# Patient Record
Sex: Male | Born: 1955 | Race: White | Hispanic: No | Marital: Married | Smoking: Never smoker
Health system: Southern US, Community
[De-identification: ages and names within clinical notes are randomized; demographics above are authoritative.]

## PROBLEM LIST (undated history)

## (undated) DIAGNOSIS — K5790 Diverticulosis of intestine, part unspecified, without perforation or abscess without bleeding: Secondary | ICD-10-CM

## (undated) DIAGNOSIS — I5022 Chronic systolic (congestive) heart failure: Secondary | ICD-10-CM

## (undated) DIAGNOSIS — I10 Essential (primary) hypertension: Secondary | ICD-10-CM

## (undated) DIAGNOSIS — I2510 Atherosclerotic heart disease of native coronary artery without angina pectoris: Secondary | ICD-10-CM

## (undated) DIAGNOSIS — E876 Hypokalemia: Secondary | ICD-10-CM

## (undated) DIAGNOSIS — E785 Hyperlipidemia, unspecified: Secondary | ICD-10-CM

## (undated) DIAGNOSIS — Z9289 Personal history of other medical treatment: Secondary | ICD-10-CM

## (undated) DIAGNOSIS — K5792 Diverticulitis of intestine, part unspecified, without perforation or abscess without bleeding: Secondary | ICD-10-CM

## (undated) DIAGNOSIS — I4729 Other ventricular tachycardia: Secondary | ICD-10-CM

## (undated) DIAGNOSIS — I472 Ventricular tachycardia: Secondary | ICD-10-CM

## (undated) DIAGNOSIS — I493 Ventricular premature depolarization: Secondary | ICD-10-CM

## (undated) DIAGNOSIS — R001 Bradycardia, unspecified: Secondary | ICD-10-CM

## (undated) HISTORY — DX: Personal history of other medical treatment: Z92.89

## (undated) HISTORY — PX: TONSILLECTOMY: SHX5217

## (undated) HISTORY — PX: CHOLECYSTECTOMY: SHX55

## (undated) HISTORY — PX: VASECTOMY: SHX75

## (undated) HISTORY — DX: Diverticulitis of intestine, part unspecified, without perforation or abscess without bleeding: K57.92

## (undated) HISTORY — DX: Essential (primary) hypertension: I10

## (undated) HISTORY — DX: Hyperlipidemia, unspecified: E78.5

## (undated) HISTORY — PX: CORONARY ANGIOPLASTY WITH STENT PLACEMENT: SHX49

## (undated) HISTORY — DX: Diverticulosis of intestine, part unspecified, without perforation or abscess without bleeding: K57.90

## (undated) HISTORY — DX: Atherosclerotic heart disease of native coronary artery without angina pectoris: I25.10

---

## 2002-06-03 ENCOUNTER — Inpatient Hospital Stay (HOSPITAL_COMMUNITY): Admission: EM | Admit: 2002-06-03 | Discharge: 2002-06-07 | Payer: Self-pay | Admitting: Emergency Medicine

## 2002-06-03 ENCOUNTER — Encounter: Payer: Self-pay | Admitting: Emergency Medicine

## 2002-06-03 ENCOUNTER — Encounter: Payer: Self-pay | Admitting: Internal Medicine

## 2002-06-04 ENCOUNTER — Encounter: Payer: Self-pay | Admitting: Cardiology

## 2002-07-06 ENCOUNTER — Encounter (HOSPITAL_COMMUNITY): Admission: RE | Admit: 2002-07-06 | Discharge: 2002-10-04 | Payer: Self-pay | Admitting: Cardiology

## 2002-07-27 ENCOUNTER — Encounter: Payer: Self-pay | Admitting: Emergency Medicine

## 2002-07-27 ENCOUNTER — Emergency Department (HOSPITAL_COMMUNITY): Admission: EM | Admit: 2002-07-27 | Discharge: 2002-07-27 | Payer: Self-pay | Admitting: Emergency Medicine

## 2002-07-28 ENCOUNTER — Encounter: Payer: Self-pay | Admitting: Cardiology

## 2002-07-28 ENCOUNTER — Encounter: Payer: Self-pay | Admitting: Emergency Medicine

## 2002-07-28 ENCOUNTER — Observation Stay (HOSPITAL_COMMUNITY): Admission: EM | Admit: 2002-07-28 | Discharge: 2002-07-29 | Payer: Self-pay | Admitting: *Deleted

## 2002-10-05 ENCOUNTER — Encounter (HOSPITAL_COMMUNITY): Admission: RE | Admit: 2002-10-05 | Discharge: 2002-10-30 | Payer: Self-pay | Admitting: Cardiology

## 2003-05-02 ENCOUNTER — Encounter: Admission: RE | Admit: 2003-05-02 | Discharge: 2003-05-02 | Payer: Self-pay | Admitting: Internal Medicine

## 2003-05-02 ENCOUNTER — Encounter: Payer: Self-pay | Admitting: Internal Medicine

## 2004-03-16 ENCOUNTER — Encounter (INDEPENDENT_AMBULATORY_CARE_PROVIDER_SITE_OTHER): Payer: Self-pay | Admitting: *Deleted

## 2004-03-16 ENCOUNTER — Observation Stay (HOSPITAL_COMMUNITY): Admission: RE | Admit: 2004-03-16 | Discharge: 2004-03-16 | Payer: Self-pay | Admitting: General Surgery

## 2004-12-03 ENCOUNTER — Inpatient Hospital Stay (HOSPITAL_COMMUNITY): Admission: AD | Admit: 2004-12-03 | Discharge: 2004-12-05 | Payer: Self-pay | Admitting: Cardiology

## 2004-12-03 ENCOUNTER — Ambulatory Visit: Payer: Self-pay | Admitting: Internal Medicine

## 2004-12-10 ENCOUNTER — Ambulatory Visit: Payer: Self-pay | Admitting: Internal Medicine

## 2004-12-13 ENCOUNTER — Ambulatory Visit (HOSPITAL_COMMUNITY): Admission: RE | Admit: 2004-12-13 | Discharge: 2004-12-14 | Payer: Self-pay | Admitting: Cardiology

## 2004-12-13 ENCOUNTER — Ambulatory Visit: Payer: Self-pay | Admitting: Cardiology

## 2004-12-20 ENCOUNTER — Ambulatory Visit: Payer: Self-pay | Admitting: Cardiology

## 2005-01-18 ENCOUNTER — Ambulatory Visit: Payer: Self-pay | Admitting: Cardiology

## 2005-04-20 ENCOUNTER — Ambulatory Visit: Payer: Self-pay | Admitting: Internal Medicine

## 2005-04-21 ENCOUNTER — Inpatient Hospital Stay (HOSPITAL_COMMUNITY): Admission: EM | Admit: 2005-04-21 | Discharge: 2005-04-24 | Payer: Self-pay | Admitting: Emergency Medicine

## 2005-05-01 ENCOUNTER — Ambulatory Visit: Payer: Self-pay | Admitting: Internal Medicine

## 2005-06-03 ENCOUNTER — Ambulatory Visit: Payer: Self-pay | Admitting: Cardiology

## 2005-11-18 HISTORY — PX: COLONOSCOPY: SHX174

## 2005-11-21 ENCOUNTER — Ambulatory Visit: Payer: Self-pay | Admitting: Family Medicine

## 2005-12-17 ENCOUNTER — Ambulatory Visit: Payer: Self-pay | Admitting: Cardiology

## 2006-01-06 ENCOUNTER — Ambulatory Visit: Payer: Self-pay | Admitting: Cardiology

## 2006-01-13 ENCOUNTER — Ambulatory Visit: Payer: Self-pay | Admitting: Cardiology

## 2006-02-28 ENCOUNTER — Ambulatory Visit: Payer: Self-pay | Admitting: Internal Medicine

## 2006-03-14 ENCOUNTER — Ambulatory Visit: Payer: Self-pay | Admitting: Cardiology

## 2006-06-10 ENCOUNTER — Ambulatory Visit: Payer: Self-pay | Admitting: Internal Medicine

## 2006-07-24 ENCOUNTER — Ambulatory Visit: Payer: Self-pay | Admitting: Cardiology

## 2006-12-24 ENCOUNTER — Ambulatory Visit: Payer: Self-pay | Admitting: Internal Medicine

## 2006-12-24 LAB — CONVERTED CEMR LAB
Basophils Absolute: 0.1 K/uL
Basophils Relative: 1.6 % — ABNORMAL HIGH
Eosinophils Absolute: 0.3 K/uL
Eosinophils Relative: 4.7 %
HCT: 48.7 %
Hemoglobin: 16.9 g/dL
Lymphocytes Relative: 22.4 %
MCHC: 34.8 g/dL
MCV: 94.2 fL
Monocytes Absolute: 0.8 K/uL — ABNORMAL HIGH
Monocytes Relative: 11.5 % — ABNORMAL HIGH
Neutro Abs: 4.3 K/uL
Neutrophils Relative %: 59.8 %
PSA: 0.88 ng/mL
Platelets: 194 K/uL
RBC: 5.16 M/uL
RDW: 12.1 %
WBC: 7.1 10*3/microliter

## 2007-02-23 ENCOUNTER — Ambulatory Visit: Payer: Self-pay | Admitting: Gastroenterology

## 2007-03-17 ENCOUNTER — Ambulatory Visit: Payer: Self-pay | Admitting: Gastroenterology

## 2007-03-20 ENCOUNTER — Ambulatory Visit: Payer: Self-pay | Admitting: Cardiology

## 2007-03-30 DIAGNOSIS — I2510 Atherosclerotic heart disease of native coronary artery without angina pectoris: Secondary | ICD-10-CM | POA: Insufficient documentation

## 2007-03-30 DIAGNOSIS — R6889 Other general symptoms and signs: Secondary | ICD-10-CM | POA: Insufficient documentation

## 2007-03-30 DIAGNOSIS — I252 Old myocardial infarction: Secondary | ICD-10-CM | POA: Insufficient documentation

## 2007-03-31 ENCOUNTER — Encounter: Payer: Self-pay | Admitting: Internal Medicine

## 2007-03-31 ENCOUNTER — Ambulatory Visit: Payer: Self-pay | Admitting: Internal Medicine

## 2007-03-31 LAB — CONVERTED CEMR LAB
Nitrite: NEGATIVE
Specific Gravity, Urine: 1.015

## 2007-04-01 ENCOUNTER — Encounter: Payer: Self-pay | Admitting: Internal Medicine

## 2007-04-08 ENCOUNTER — Encounter: Payer: Self-pay | Admitting: Internal Medicine

## 2007-04-08 ENCOUNTER — Ambulatory Visit: Payer: Self-pay | Admitting: Internal Medicine

## 2007-04-09 ENCOUNTER — Encounter: Payer: Self-pay | Admitting: Internal Medicine

## 2007-12-22 ENCOUNTER — Ambulatory Visit: Payer: Self-pay | Admitting: Internal Medicine

## 2007-12-22 DIAGNOSIS — B372 Candidiasis of skin and nail: Secondary | ICD-10-CM | POA: Insufficient documentation

## 2008-02-17 ENCOUNTER — Ambulatory Visit: Payer: Self-pay | Admitting: Cardiology

## 2008-02-23 ENCOUNTER — Encounter: Payer: Self-pay | Admitting: Internal Medicine

## 2008-02-23 ENCOUNTER — Ambulatory Visit: Payer: Self-pay

## 2008-03-30 ENCOUNTER — Ambulatory Visit: Payer: Self-pay | Admitting: Cardiology

## 2008-04-06 ENCOUNTER — Telehealth (INDEPENDENT_AMBULATORY_CARE_PROVIDER_SITE_OTHER): Payer: Self-pay | Admitting: *Deleted

## 2008-07-27 ENCOUNTER — Telehealth (INDEPENDENT_AMBULATORY_CARE_PROVIDER_SITE_OTHER): Payer: Self-pay | Admitting: *Deleted

## 2008-08-26 ENCOUNTER — Telehealth (INDEPENDENT_AMBULATORY_CARE_PROVIDER_SITE_OTHER): Payer: Self-pay | Admitting: *Deleted

## 2008-08-29 ENCOUNTER — Ambulatory Visit: Payer: Self-pay | Admitting: Internal Medicine

## 2008-08-29 DIAGNOSIS — E785 Hyperlipidemia, unspecified: Secondary | ICD-10-CM | POA: Insufficient documentation

## 2008-08-29 DIAGNOSIS — R1011 Right upper quadrant pain: Secondary | ICD-10-CM | POA: Insufficient documentation

## 2008-08-29 DIAGNOSIS — I10 Essential (primary) hypertension: Secondary | ICD-10-CM | POA: Insufficient documentation

## 2008-08-29 LAB — CONVERTED CEMR LAB
Bilirubin Urine: NEGATIVE
Ketones, urine, test strip: NEGATIVE
Protein, U semiquant: NEGATIVE
Specific Gravity, Urine: 1.015
Urobilinogen, UA: 0.2
pH: 6.5

## 2008-08-30 ENCOUNTER — Encounter (INDEPENDENT_AMBULATORY_CARE_PROVIDER_SITE_OTHER): Payer: Self-pay | Admitting: *Deleted

## 2008-08-30 LAB — CONVERTED CEMR LAB
AST: 18 units/L (ref 0–37)
BUN: 12 mg/dL (ref 6–23)
Creatinine, Ser: 0.9 mg/dL (ref 0.4–1.5)
Eosinophils Absolute: 0.1 10*3/uL (ref 0.0–0.7)
HCT: 46 % (ref 39.0–52.0)
Hemoglobin: 16.5 g/dL (ref 13.0–17.0)
Lymphocytes Relative: 29.7 % (ref 12.0–46.0)
MCV: 92.7 fL (ref 78.0–100.0)
Monocytes Relative: 7.6 % (ref 3.0–12.0)
Neutrophils Relative %: 60.7 % (ref 43.0–77.0)
Platelets: 208 10*3/uL (ref 150–400)
RBC: 4.96 M/uL (ref 4.22–5.81)
RDW: 12 % (ref 11.5–14.6)

## 2008-09-02 ENCOUNTER — Ambulatory Visit: Payer: Self-pay | Admitting: Cardiology

## 2008-09-02 ENCOUNTER — Telehealth (INDEPENDENT_AMBULATORY_CARE_PROVIDER_SITE_OTHER): Payer: Self-pay | Admitting: *Deleted

## 2008-09-06 ENCOUNTER — Encounter (INDEPENDENT_AMBULATORY_CARE_PROVIDER_SITE_OTHER): Payer: Self-pay | Admitting: *Deleted

## 2008-09-06 ENCOUNTER — Ambulatory Visit: Payer: Self-pay | Admitting: Internal Medicine

## 2008-09-06 LAB — CONVERTED CEMR LAB
OCCULT 1: NEGATIVE
OCCULT 2: NEGATIVE
OCCULT 3: NEGATIVE

## 2008-09-12 ENCOUNTER — Ambulatory Visit: Payer: Self-pay | Admitting: Cardiology

## 2008-09-12 LAB — CONVERTED CEMR LAB
Cholesterol: 152 mg/dL (ref 0–200)
HDL: 38.4 mg/dL — ABNORMAL LOW (ref >39.0)
Triglycerides: 99 mg/dL (ref 0–149)
VLDL: 20 mg/dL (ref 0–40)

## 2008-12-14 LAB — HM COLONOSCOPY

## 2009-05-02 ENCOUNTER — Ambulatory Visit: Payer: Self-pay | Admitting: Internal Medicine

## 2009-07-06 ENCOUNTER — Telehealth (INDEPENDENT_AMBULATORY_CARE_PROVIDER_SITE_OTHER): Payer: Self-pay | Admitting: *Deleted

## 2009-07-06 ENCOUNTER — Ambulatory Visit: Payer: Self-pay | Admitting: Internal Medicine

## 2009-07-06 DIAGNOSIS — L821 Other seborrheic keratosis: Secondary | ICD-10-CM | POA: Insufficient documentation

## 2009-07-06 DIAGNOSIS — D239 Other benign neoplasm of skin, unspecified: Secondary | ICD-10-CM | POA: Insufficient documentation

## 2009-07-31 ENCOUNTER — Telehealth: Payer: Self-pay | Admitting: Cardiology

## 2009-08-01 ENCOUNTER — Ambulatory Visit: Payer: Self-pay | Admitting: Cardiology

## 2009-08-01 ENCOUNTER — Encounter: Payer: Self-pay | Admitting: Internal Medicine

## 2009-08-01 ENCOUNTER — Encounter (INDEPENDENT_AMBULATORY_CARE_PROVIDER_SITE_OTHER): Payer: Self-pay

## 2009-08-02 ENCOUNTER — Observation Stay (HOSPITAL_COMMUNITY): Admission: AD | Admit: 2009-08-02 | Discharge: 2009-08-03 | Payer: Self-pay | Admitting: Cardiology

## 2009-08-02 ENCOUNTER — Ambulatory Visit: Payer: Self-pay | Admitting: Cardiology

## 2009-08-05 ENCOUNTER — Emergency Department (HOSPITAL_COMMUNITY): Admission: EM | Admit: 2009-08-05 | Discharge: 2009-08-05 | Payer: Self-pay | Admitting: Emergency Medicine

## 2009-08-10 ENCOUNTER — Telehealth (INDEPENDENT_AMBULATORY_CARE_PROVIDER_SITE_OTHER): Payer: Self-pay | Admitting: *Deleted

## 2009-08-15 ENCOUNTER — Encounter: Payer: Self-pay | Admitting: Cardiology

## 2009-08-16 ENCOUNTER — Ambulatory Visit: Payer: Self-pay | Admitting: Cardiology

## 2009-08-21 ENCOUNTER — Ambulatory Visit: Payer: Self-pay | Admitting: Cardiology

## 2009-08-23 LAB — CONVERTED CEMR LAB
AST: 23 units/L (ref 0–37)
Albumin: 3.5 g/dL (ref 3.5–5.2)
Bilirubin, Direct: 0.2 mg/dL (ref 0.0–0.3)
Cholesterol: 94 mg/dL (ref 0–200)
LDL Cholesterol: 55 mg/dL (ref 0–99)
Total CHOL/HDL Ratio: 3
Total Protein: 6.6 g/dL (ref 6.0–8.3)

## 2009-09-29 ENCOUNTER — Encounter (INDEPENDENT_AMBULATORY_CARE_PROVIDER_SITE_OTHER): Payer: Self-pay | Admitting: *Deleted

## 2009-10-05 ENCOUNTER — Ambulatory Visit: Payer: Self-pay | Admitting: Cardiology

## 2009-10-23 ENCOUNTER — Telehealth (INDEPENDENT_AMBULATORY_CARE_PROVIDER_SITE_OTHER): Payer: Self-pay | Admitting: *Deleted

## 2010-01-11 ENCOUNTER — Ambulatory Visit: Payer: Self-pay | Admitting: Cardiology

## 2010-01-31 ENCOUNTER — Ambulatory Visit: Payer: Self-pay | Admitting: Internal Medicine

## 2010-01-31 DIAGNOSIS — M79609 Pain in unspecified limb: Secondary | ICD-10-CM | POA: Insufficient documentation

## 2010-02-19 ENCOUNTER — Telehealth (INDEPENDENT_AMBULATORY_CARE_PROVIDER_SITE_OTHER): Payer: Self-pay | Admitting: *Deleted

## 2010-04-13 ENCOUNTER — Telehealth: Payer: Self-pay | Admitting: Cardiology

## 2010-08-14 ENCOUNTER — Telehealth (INDEPENDENT_AMBULATORY_CARE_PROVIDER_SITE_OTHER): Payer: Self-pay | Admitting: *Deleted

## 2010-08-14 ENCOUNTER — Telehealth: Payer: Self-pay | Admitting: Cardiology

## 2010-09-28 ENCOUNTER — Ambulatory Visit: Payer: Self-pay | Admitting: Internal Medicine

## 2010-09-28 ENCOUNTER — Telehealth: Payer: Self-pay | Admitting: Internal Medicine

## 2010-09-28 DIAGNOSIS — N400 Enlarged prostate without lower urinary tract symptoms: Secondary | ICD-10-CM | POA: Insufficient documentation

## 2010-09-28 DIAGNOSIS — G250 Essential tremor: Secondary | ICD-10-CM | POA: Insufficient documentation

## 2010-10-01 ENCOUNTER — Ambulatory Visit: Payer: Self-pay | Admitting: Internal Medicine

## 2010-10-15 LAB — CONVERTED CEMR LAB
ALT: 31 units/L (ref 0–53)
AST: 24 units/L (ref 0–37)
Albumin: 4 g/dL (ref 3.5–5.2)
Basophils Relative: 0.5 % (ref 0.0–3.0)
CO2: 28 meq/L (ref 19–32)
Chloride: 105 meq/L (ref 96–112)
Cholesterol: 141 mg/dL (ref 0–200)
Creatinine, Ser: 1 mg/dL (ref 0.4–1.5)
Eosinophils Absolute: 0.2 10*3/uL (ref 0.0–0.7)
Eosinophils Relative: 2.3 % (ref 0.0–5.0)
Glucose, Bld: 96 mg/dL (ref 70–99)
HDL: 43.7 mg/dL (ref >39.00)
Lymphocytes Relative: 34.4 % (ref 12.0–46.0)
Lymphs Abs: 2.6 10*3/uL (ref 0.7–4.0)
MCHC: 35.4 g/dL (ref 30.0–36.0)
Monocytes Absolute: 0.6 10*3/uL (ref 0.1–1.0)
Neutro Abs: 4.1 10*3/uL (ref 1.4–7.7)
Platelets: 209 10*3/uL (ref 150.0–400.0)
Potassium: 4.7 meq/L (ref 3.5–5.1)
Sodium: 138 meq/L (ref 135–145)
Total Bilirubin: 1.1 mg/dL (ref 0.3–1.2)
Total CHOL/HDL Ratio: 3
VLDL: 11.6 mg/dL (ref 0.0–40.0)

## 2010-10-23 ENCOUNTER — Ambulatory Visit: Payer: Self-pay | Admitting: Sports Medicine

## 2010-10-23 DIAGNOSIS — M722 Plantar fascial fibromatosis: Secondary | ICD-10-CM | POA: Insufficient documentation

## 2010-11-20 ENCOUNTER — Encounter (INDEPENDENT_AMBULATORY_CARE_PROVIDER_SITE_OTHER): Payer: Self-pay | Admitting: *Deleted

## 2010-11-20 ENCOUNTER — Other Ambulatory Visit: Payer: Self-pay | Admitting: Physician Assistant

## 2010-11-20 ENCOUNTER — Ambulatory Visit
Admission: RE | Admit: 2010-11-20 | Discharge: 2010-11-20 | Payer: Self-pay | Source: Home / Self Care | Attending: Cardiology | Admitting: Cardiology

## 2010-11-20 ENCOUNTER — Ambulatory Visit: Admit: 2010-11-20 | Payer: Self-pay | Admitting: Physician Assistant

## 2010-11-20 ENCOUNTER — Encounter: Payer: Self-pay | Admitting: Physician Assistant

## 2010-11-20 ENCOUNTER — Telehealth: Payer: Self-pay | Admitting: Cardiology

## 2010-11-20 DIAGNOSIS — R079 Chest pain, unspecified: Secondary | ICD-10-CM | POA: Insufficient documentation

## 2010-11-20 LAB — BASIC METABOLIC PANEL
BUN: 12 mg/dL (ref 6–23)
CO2: 26 mEq/L (ref 19–32)
Calcium: 9.2 mg/dL (ref 8.4–10.5)
Chloride: 105 mEq/L (ref 96–112)
Creatinine, Ser: 0.8 mg/dL (ref 0.4–1.5)
GFR: 104.47 mL/min (ref >60.00)
Glucose, Bld: 84 mg/dL (ref 70–99)
Potassium: 4.2 mEq/L (ref 3.5–5.1)
Sodium: 139 mEq/L (ref 135–145)

## 2010-11-20 LAB — PROTIME-INR
INR: 1 ratio (ref 0.8–1.0)
Prothrombin Time: 11.2 s (ref 9.7–11.8)

## 2010-11-20 LAB — APTT: aPTT: 28.6 s (ref 21.7–28.8)

## 2010-11-20 LAB — CBC WITH DIFFERENTIAL/PLATELET
Basophils Absolute: 0 10*3/uL (ref 0.0–0.1)
Basophils Relative: 0.4 % (ref 0.0–3.0)
Eosinophils Absolute: 0.2 10*3/uL (ref 0.0–0.7)
Eosinophils Relative: 2.4 % (ref 0.0–5.0)
HCT: 47.6 % (ref 39.0–52.0)
Hemoglobin: 16.6 g/dL (ref 13.0–17.0)
Lymphocytes Relative: 36 % (ref 12.0–46.0)
Lymphs Abs: 2.7 10*3/uL (ref 0.7–4.0)
MCHC: 34.8 g/dL (ref 30.0–36.0)
MCV: 95.1 fl (ref 78.0–100.0)
Monocytes Absolute: 0.8 10*3/uL (ref 0.1–1.0)
Monocytes Relative: 11.3 % (ref 3.0–12.0)
Neutro Abs: 3.7 10*3/uL (ref 1.4–7.7)
Neutrophils Relative %: 49.9 % (ref 43.0–77.0)
Platelets: 198 10*3/uL (ref 150.0–400.0)
RBC: 5.01 Mil/uL (ref 4.22–5.81)
RDW: 12.9 % (ref 11.5–14.6)
WBC: 7.4 10*3/uL (ref 4.5–10.5)

## 2010-11-21 ENCOUNTER — Ambulatory Visit
Admission: RE | Admit: 2010-11-21 | Payer: Self-pay | Source: Home / Self Care | Attending: Cardiovascular Disease | Admitting: Cardiovascular Disease

## 2010-11-26 ENCOUNTER — Telehealth (INDEPENDENT_AMBULATORY_CARE_PROVIDER_SITE_OTHER): Payer: Self-pay | Admitting: Radiology

## 2010-11-27 ENCOUNTER — Encounter (HOSPITAL_COMMUNITY)
Admission: RE | Admit: 2010-11-27 | Discharge: 2010-12-18 | Payer: Self-pay | Source: Home / Self Care | Attending: Cardiovascular Disease | Admitting: Cardiovascular Disease

## 2010-11-27 ENCOUNTER — Ambulatory Visit: Admission: RE | Admit: 2010-11-27 | Discharge: 2010-11-27 | Payer: Self-pay | Source: Home / Self Care

## 2010-11-27 ENCOUNTER — Encounter: Payer: Self-pay | Admitting: Cardiovascular Disease

## 2010-12-07 ENCOUNTER — Ambulatory Visit
Admission: RE | Admit: 2010-12-07 | Discharge: 2010-12-07 | Payer: Self-pay | Source: Home / Self Care | Attending: Cardiology | Admitting: Cardiology

## 2010-12-07 ENCOUNTER — Encounter: Payer: Self-pay | Admitting: Cardiology

## 2010-12-13 ENCOUNTER — Encounter: Payer: Self-pay | Admitting: Family Medicine

## 2010-12-13 ENCOUNTER — Ambulatory Visit
Admission: RE | Admit: 2010-12-13 | Discharge: 2010-12-13 | Payer: Self-pay | Source: Home / Self Care | Attending: Family Medicine | Admitting: Family Medicine

## 2010-12-13 DIAGNOSIS — J029 Acute pharyngitis, unspecified: Secondary | ICD-10-CM | POA: Insufficient documentation

## 2010-12-18 NOTE — Progress Notes (Signed)
Summary: CLONAZEPAM REFILL--HAS CPX APPT  Phone Note Refill Request Message from:  Patient on August 14, 2010 10:28 AM  Refills Requested: Medication #1:  KLONOPIN 0.5 MG  TABS 1 at bedtime as needed [facility]    NEEDS 90 DAY SUPPLY    PT PHONE NUMBER IS [phone]  Initial call taken by: Jerolyn Shin,  August 14, 2010 10:30 AM  Follow-up for Phone Call        Patient needs to schedule a yearly appointment seen for an acute on 01/31/10  Please find out if patient would like for me to fax rx, mail or pick-up Follow-up by: Shonna Chock CMA,  August 14, 2010 1:34 PM  Additional Follow-up for Phone Call Additional follow up Details #1::        PATIENT HAS CPX APPT FOR THUR 10/13 AT 10:45---PLEASE FAX PRESCRIPTION REFILL TO [facility] Additional Follow-up by: Jerolyn Shin,  August 15, 2010 12:30 PM    Additional Follow-up for Phone Call Additional follow up Details #2::    Faxed to [facility] Follow-up by: Shonna Chock CMA,  August 15, 2010 2:55 PM  Prescriptions: KLONOPIN 0.5 MG  TABS (CLONAZEPAM) 1 at bedtime as needed  #90 x 0   Entered by:   Shonna Chock CMA   Authorized by:   Marga Melnick MD   Signed by:   Shonna Chock CMA on 08/14/2010   Method used:   Print then Give to Patient   RxID:   [id number]

## 2010-12-18 NOTE — Progress Notes (Signed)
Summary: sports med doc  Phone Note Call from Patient Call back at [facility] Phone [phone]   Summary of Call: Pt would like to know is he going to be set up to see a Sports Med doctor? He thinks he remembers discussing this during his OV. Initial call taken by: Army Fossa CMA,  September 28, 2010 4:40 PM  Follow-up for Phone Call        PATIENT REFERRED TO DR. BERT FIELDS, FAXED & FLAGGED THRU EMR.  DR. FIELDS OFFICE CONTACTS THE PATIENT. Magdalen Spatz [facility]  September 28, 2010 4:59 PM

## 2010-12-18 NOTE — Assessment & Plan Note (Signed)
Summary: CPX AND LABS///SP[facility]   Vital Signs:  Patient profile:   55 year old male Height:      64.25 inches Weight:      200.2 pounds BMI:     34.22 Temp:     98.4 degrees F oral Pulse rate:   80 / minute Resp:     14 per minute BP sitting:   114 / 78  (left arm) Cuff size:   large  Vitals Entered By: Shonna Chock CMA (September 28, 2010 4:00 PM) CC: CPX, patient states EKG updated at Cardiologist   CC:  CPX and patient states EKG updated at Cardiologist.  History of Present Illness:         Zachary Stone is here for a physical; he continues to have Plantar Fasciitis symptoms .THere has been partialresponse to arch supports. Hyperlipidemia Follow-Up        The patient reports fatigue, but denies muscle aches, GI upset, abdominal pain, flushing, itching, constipation, and diarrhea.  Other symptoms include intermittent chest pain/pressure; this has been discussed with Dr Riley Kill.  The patient denies the following symptoms: exercise intolerance, dypsnea, palpitations, syncope, and pedal edema.  Compliance with medications (by patient report) has been near 100%.  Dietary compliance has been fair- good.  The patient reports exercising occasionally, 2X/week.  Adjunctive measures currently  used by the patient include ASA.   Hypertension Follow-Up       The patient denies lightheadedness, urinary frequency, and headaches.  Compliance with medications (by patient report) has been near 100%.  Adjunctive measures currently used by the patient  does NOT include salt restriction. BP not monitored.   Current Medications (verified): 1)  Diovan 80 Mg  Tabs (Valsartan) .Marland Kitchen.. 1 By Mouth Qd 2)  Plavix 75 Mg  Tabs (Clopidogrel Bisulfate) .Marland Kitchen.. 1 By Mouth Qd 3)  Vytorin 10-20 Mg  Tabs (Ezetimibe-Simvastatin) .... Take One Tablet  By Mouth At Bedtime 4)  Aspirin 81 Mg Tbec (Aspirin) .... Take One Tablet By Mouth Daily 5)  Klonopin 0.5 Mg  Tabs (Clonazepam) .Marland Kitchen.. 1 At Bedtime As Needed 6)  Multivitamins   Tabs  (Multiple Vitamin) .... Take 1 Tablet By Mouth Once A Day  Allergies: 1)  ! Morphine 2)  ! Iodine  Past History:  Past Medical History: Coronary artery disease--staged PCI in 2006 with DES to RCA and LAD. Myocardial infarction,PMH  of, 2003--non DES stent Hyperlipidemia Hypertension  Past Surgical History: Cholecystectomy PTCA/stent X4, Dr Riley Kill Tonsillectomy Colonoscopy 2007 negative ; redo 2017 Vasectomy, S/P reversal  Family History: Family History High cholesterol M uncle d ? skin cancer  Father: CABG, triple vessel Mother: COAD Siblings: neg  Social History: Occupation: Medical sales representative Married Never Smoked Alcohol use-yes: socially  Review of Systems  The patient denies anorexia, fever, vision loss, decreased hearing, hoarseness, prolonged cough, hemoptysis, melena, hematochezia, severe indigestion/heartburn, hematuria, suspicious skin lesions, depression, unusual weight change, abnormal bleeding, enlarged lymph nodes, and angioedema.         Weight up 10#  Physical Exam  General:  well-nourished; alert,appropriate and cooperative throughout examination Head:  Normocephalic and atraumatic without obvious abnormalities.  Pattern  alopecia; goatee/moustache  Eyes:  No corneal or conjunctival inflammation noted. Perrla. Funduscopic exam benign, without hemorrhages, exudates or papilledema.  Ears:  External ear exam shows no significant lesions or deformities.  Otoscopic examination reveals clear canals, tympanic membranes are intact bilaterally without bulging, retraction, inflammation or discharge. Hearing is grossly normal bilaterally. Nose:  External nasal examination shows no  deformity or inflammation. Nasal mucosa are pink and moist without lesions or exudates. Mouth:  Oral mucosa and oropharynx without lesions or exudates.  Teeth in good repair. Neck:  No deformities, masses, or tenderness noted. Lungs:  Normal respiratory effort, chest expands  symmetrically. Lungs are clear to auscultation, no crackles or wheezes. Heart:  Normal rate and regular rhythm. S1 and S2 normal without gallop, murmur, click, rub . S4 Abdomen:  Bowel sounds positive,abdomen soft and non-tender without masses, organomegaly or hernias noted. Rectal:  No external abnormalities noted. Normal sphincter tone. No rectal masses or tenderness. Genitalia:  Testes bilaterally descended without nodularity, tenderness or masses. No scrotal masses or lesions. No penis lesions or urethral discharge. Prostate:  no nodules, no asymmetry, no induration, and 1+ enlarged.   Msk:  No deformity or scoliosis noted of thoracic or lumbar spine.   Pulses:  R and L carotid,radial,dorsalis pedis and posterior tibial pulses are full and equal bilaterally Extremities:  No clubbing, cyanosis, edema, or deformity noted with normal full range of motion of all joints.   Neurologic:  alert & oriented X3 and DTRs symmetrical and normal.  Fine tremor Skin:  Intact without suspicious lesions or rashes Cervical Nodes:  No lymphadenopathy noted Axillary Nodes:  No palpable lymphadenopathy Psych:  memory intact for recent and remote, normally interactive, and good eye contact.     Impression & Recommendations:  Problem # 1:  ROUTINE GENERAL MEDICAL EXAM@HEALTH  CARE FACL (ICD-V70.0)  Problem # 2:  HYPERTENSION (ICD-401.9) controlled His updated medication list for this problem includes:    Diovan 80 Mg Tabs (Valsartan) .Marland Kitchen... 1 by mouth qd    Propranolol Hcl 10 Mg Tabs (Propranolol hcl) .Marland Kitchen... 1 every hrs as needed  Problem # 3:  HYPERLIPIDEMIA (ICD-272.4)  His updated medication list for this problem includes:    Vytorin 10-20 Mg Tabs (Ezetimibe-simvastatin) .Marland Kitchen... 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 1  by mouth qd 2)  Plavix 75 Mg Tabs (Clopidogrel bisulfate) .Marland Kitchen.. 1 by mouth qd 3)  Vytorin 10-20 Mg Tabs (Ezetimibe-simvastatin) .... Take one tablet  by mouth at bedtime 4)  Aspirin 81 Mg Tbec (Aspirin) .... Take one tablet by mouth daily 5)  Klonopin 0.5 Mg Tabs (Clonazepam) .Marland Kitchen.. 1 at bedtime as needed 6)  Multivitamins Tabs (Multiple vitamin) .... Take 1 tablet by mouth once a day 7)  Propranolol Hcl 10 Mg Tabs (Propranolol hcl) .Marland Kitchen.. 1 every hrs as needed  Patient Instructions: 1)  Consider Saw Palmetto Extract for prostate symptoms.Please schedule fasting labs: 2)  BMP ; 3)  Hepatic Panel; 4)  Lipid Panel ; 5)  TSH ; 6)  CBC w/ Diff ; 7)  PSA . Prescriptions: PROPRANOLOL HCL 10 MG TABS (PROPRANOLOL HCL) 1 every hrs as needed  #30 x 5   Entered and Authorized by:   Marga Melnick MD   Signed by:   Marga Melnick MD on 09/28/2010   Method used:   Print then Give to Patient   RxID:   [id number]    Orders Added: 1)  Est. Patient 40-64 years [56213]

## 2010-12-18 NOTE — Progress Notes (Signed)
Summary: NEEDS CLONAZEPAM 90 REFILL FAXED TO [facility]  Phone Note Call from Patient Call back at Home Phone [phone]   Caller: Patient Summary of Call: NEEDS 90 DAY REFILL FOR GENERIC CLONAZPAM---HAD SEEN DR HOPPER FOR ACUTE VISITS, BUT I CANNOT FIND LISITING FOR CPX IN EMR  PT NEEDS REFILLED CALLED OR FAXED INTO [facility] SINCE HE ONLY HAS 7 PILLS LEFT  ADVISED PATIENT THAT HE MAY HAVE TO SCHEDULE CPX SO THAT REFILL CAN BE CALLED IN--ADVISED THAT WE WILL CALL HIM TO TELL HIM THAT PRESCRIPTION WAS CALLED IN  AND THAT WE WILL ALSO TELL HIM IF HE HAS TO SCHEDULE A CPX Initial call taken by: Jerolyn Shin,  February 19, 2010 9:19 AM  Follow-up for Phone Call        Dr.Hopper this patient has a cardiologist and was seen in the hospital with in the past year. ? If he needs a CPX with you to get a Mail Order (90 day supply of controlled med) Follow-up by: Shonna Chock,  February 19, 2010 10:34 AM  Additional Follow-up for Phone Call Additional follow up Details #1::        Per Dr.Hopper patient should have a CPX a year from the date of his last Hospital visit.  I called and informed patient, last hospital visit 07/2009. Yearly/CPX would be due 07/2010 of this year.  Additional Follow-up by: Shonna Chock,  February 21, 2010 8:32 AM    Prescriptions: KLONOPIN 0.5 MG  TABS (CLONAZEPAM) 1 at bedtime as needed  #90 x 0   Entered and Authorized by:   Marga Melnick MD   Signed by:   Marga Melnick MD on 02/19/2010   Method used:   Printed then faxed to ...       [facility]* (mail-order)             ,          Ph: [phone]       Fax: [phone]   RxID:   [id number]

## 2010-12-18 NOTE — Letter (Signed)
Summary: *Consult Note  [facility]  [address]   [place], [place] [address]   Phone: [phone]  Fax: [phone]    Re:    Zachary Stone DOB:    August 11, 1956 Marga Melnick, MD [facility] [place], [place]  10/23/10   Dear Alfonse Flavors:    Thank you for requesting that we see the above patient for consultation.  A copy of the detailed office note will be sent under separate cover, for your review.  Evaluation today is consistent with:  1)  PLANTAR FASCIITIS, LEFT (ICD-728.71)   Our recommendation is for: a program of exercises, stretches and sports insole support.  If he does not improve in 8 weeks I would opt for custom orthotics.   New Orders include:  1)  Consultation Level II [99242] 2)  Sports Insoles [L3510] 3)  [place] LIMITED [31517]    After today's visit, the patients current medications include: 1)  DIOVAN 80 MG  TABS (VALSARTAN) 1 by mouth qd 2)  PLAVIX 75 MG  TABS (CLOPIDOGREL BISULFATE) 1 by mouth qd 3)  VYTORIN 10-20 MG  TABS (EZETIMIBE-SIMVASTATIN) take one tablet  by mouth at bedtime 4)  ASPIRIN 81 MG TBEC (ASPIRIN) Take one tablet by mouth daily 5)  KLONOPIN 0.5 MG  TABS (CLONAZEPAM) 1 at bedtime as needed 6)  MULTIVITAMINS   TABS (MULTIPLE VITAMIN) Take 1 tablet by mouth once a day 7)  PROPRANOLOL HCL 10 MG TABS (PROPRANOLOL HCL) 1 every hrs as needed   Thank you for this consultation.  If you have any further questions regarding the care of this patient, please do not hesitate to contact me @ [phone].  Thank you for this opportunity to look after your patient.  Sincerely,  Vincent Gros MD

## 2010-12-18 NOTE — Assessment & Plan Note (Signed)
Summary: f61m   Visit Type:  3 months follow up  CC:  Chest pains.  History of Present Illness: Doing quite well.  No major symptoms.  Elbow (L) and R ankle bother him, but he plays mandolin.  Symptoms with statins reveiwed.  Denies chest pain.  Current Medications (verified): 1)  Diovan 80 Mg  Tabs (Valsartan) .Marland Kitchen.. 1 By Mouth Qd 2)  Plavix 75 Mg  Tabs (Clopidogrel Bisulfate) .Marland Kitchen.. 1 By Mouth Qd 3)  Vytorin 10-20 Mg  Tabs (Ezetimibe-Simvastatin) .Marland Kitchen.. 1 By Mouth Qd 4)  Aspirin 81 Mg Tbec (Aspirin) .... Take One Tablet By Mouth Daily 5)  Klonopin 0.5 Mg  Tabs (Clonazepam) .Marland Kitchen.. 1 At Bedtime As Needed 6)  Multivitamins   Tabs (Multiple Vitamin) .... Take 1 Tablet By Mouth Once A Day  Allergies: 1)  ! Morphine 2)  ! Iodine  Vital Signs:  Patient profile:   55 year old male Height:      64 inches Weight:      189 pounds BMI:     32.56 Pulse rate:   54 / minute Pulse rhythm:   irregular Resp:     18 per minute BP sitting:   122 / 76  (left arm) Cuff size:   large  Vitals Entered By: Vikki Ports (January 11, 2010 9:30 AM)  Physical Exam  General:  Well developed, well nourished, in no acute distress. Head:  normocephalic and atraumatic Lungs:  Clear bilaterally to auscultation and percussion. Heart:  PMI non displaced.  Normal S1 and S2.  No gallop. Extremities:  No clubbing or cyanosis. Neurologic:  Alert and oriented x 3.   EKG  Procedure date:  01/11/2010  Findings:      NSR.  Anterior MI, old.  Inferior MI, old.  No new changes.  Impression & Recommendations:  Problem # 1:  CORONARY ARTERY DISEASE (ICD-414.00) stable.  Remains on DAPT. His updated medication list for this problem includes:    Plavix 75 Mg Tabs (Clopidogrel bisulfate) .Marland Kitchen... 1 by mouth qd    Aspirin 81 Mg Tbec (Aspirin) .Marland Kitchen... Take one tablet by mouth daily  Problem # 2:  HYPERTENSION (ICD-401.9) Controlled. His updated medication list for this problem includes:    Diovan 80 Mg Tabs  (Valsartan) .Marland Kitchen... 1 by mouth qd    Aspirin 81 Mg Tbec (Aspirin) .Marland Kitchen... Take one tablet by mouth daily  Orders: EKG w/ Interpretation (93000)  Problem # 3:  HYPERLIPIDEMIA (ICD-272.4) reviewed in detail.  Not likely symptoms from statins.  continue medical therepy.   His updated medication list for this problem includes:    Vytorin 10-20 Mg Tabs (Ezetimibe-simvastatin) .Marland Kitchen... 1 by mouth qd  Patient Instructions: 1)  Your physician recommends that you continue on your current medications as directed. Please refer to the Current Medication list given to you today. 2)  Your physician wants you to follow-up in:  6 MONTHS. You will receive a reminder letter in the mail two months in advance. If you don't receive a letter, please call our office to schedule the follow-up appointment.

## 2010-12-18 NOTE — Assessment & Plan Note (Signed)
Summary: sore heel/kdc   Vital Signs:  Patient profile:   55 year old male Weight:      190.8 pounds Temp:     98.4 degrees F oral Pulse rate:   60 / minute Resp:     16 per minute BP sitting:   130 / 78  (left arm) Cuff size:   large  Vitals Entered By: Shonna Chock (January 31, 2010 2:35 PM) CC: Left Heel pain x 3 weeks Comments REVIEWED MED LIST, PATIENT AGREED DOSE AND INSTRUCTION CORRECT    CC:  Left Heel pain x 3 weeks.  History of Present Illness: 3-4 weeks of L heel pain since walking program started; treadmill 40 min / day 3-4X/week. No other injury . 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 Rx: "hoping"  Allergies: 1)  ! Morphine 2)  ! Iodine  Physical Exam  Pulses:  R and L dorsalis pedis and posterior tibial pulses are full and equal bilaterally Extremities:  Full ROM L ankle. Tender @ L heel medially. Clinically no plantar fasciitis. Low arches Skin:  Intact without suspicious lesions or rashes   Impression & Recommendations:  Problem # 1:  HEEL PAIN, LEFT (ICD-729.5) plantar fasciitis variant  Complete Medication List: 1)  Diovan 80 Mg Tabs (Valsartan) .Marland Kitchen.. 1 by mouth qd 2)  Plavix 75 Mg Tabs (Clopidogrel bisulfate) .Marland Kitchen.. 1 by mouth qd 3)  Vytorin 10-20 Mg Tabs (Ezetimibe-simvastatin) .Marland Kitchen.. 1 by mouth qd 4)  Aspirin 81 Mg Tbec (Aspirin) .... Take one tablet by mouth daily 5)  Klonopin 0.5 Mg Tabs (Clonazepam) .Marland Kitchen.. 1 at bedtime as needed 6)  Multivitamins Tabs (Multiple vitamin) .... Take 1 tablet by mouth once a day 7)  Celebrex 200 Mg Caps (Celecoxib) .Marland Kitchen.. 1 two times a day as needed  Patient Instructions: 1)  Epsom Salts soaks @ night . Arch supports when ambulating . Glucosamine sulfate 1500 mg once daily until pain free for 2 weeks.Stationery bike in place of treadmill until well Prescriptions: CELEBREX 200 MG CAPS (CELECOXIB) 1 two times a day as needed  #12 x 0   Entered and  Authorized by:   Marga Melnick MD   Signed by:   Marga Melnick MD on 01/31/2010   Method used:   Samples Given   RxID:   [id number]

## 2010-12-18 NOTE — Progress Notes (Signed)
Summary: NEEDS RX REFILL   Phone Note Call from Patient Call back at Home Phone [phone]   Caller: PT Reason for Call: Refill Medication Summary of Call: PT NEEDS VYTORIN CALLED INTO [facility]. PT [facility] CARE NOW PLAN [phone] GROUP # [id number] MEMBER ID [id number] Initial call taken by: Faythe Ghee,  August 14, 2010 9:41 AM  Follow-up for Phone Call        Left message for pt to call back. Julieta Gutting, RN, BSN  August 14, 2010 10:05 AM  I spoke with the pt and he has already made [facility] aware of his new insurance. I will send Rx into pharmacy.  Follow-up by: Julieta Gutting, RN, BSN,  August 14, 2010 10:20 AM    New/Updated Medications: VYTORIN 10-20 MG  TABS (EZETIMIBE-SIMVASTATIN) take one tablet  by mouth at bedtime Prescriptions: VYTORIN 10-20 MG  TABS (EZETIMIBE-SIMVASTATIN) take one tablet  by mouth at bedtime  #90 x 3   Entered by:   Julieta Gutting, RN, BSN   Authorized by:   Ronaldo Miyamoto, MD, [facility]   Signed by:   Julieta Gutting, RN, BSN on 08/14/2010   Method used:   Electronically to        [facility] (retail)             ,          Ph: [phone]       Fax: [phone]   RxID:   [id number]

## 2010-12-18 NOTE — Progress Notes (Signed)
Summary: REFILL  Phone Note Refill Request Message from:  Patient on Apr 13, 2010 9:55 AM  Refills Requested: Medication #1:  DIOVAN 80 MG  TABS 1 by mouth qd SEND TO [facility]  Initial call taken by: Judie Grieve,  Apr 13, 2010 9:56 AM  Follow-up for Phone Call        Rx faxed to pharmacy Follow-up by: Vikki Ports,  Apr 13, 2010 11:55 AM    Prescriptions: DIOVAN 80 MG  TABS (VALSARTAN) 1 by mouth qd  #90 x 3   Entered by:   Vikki Ports   Authorized by:   Ronaldo Miyamoto, MD, [facility]   Signed by:   Vikki Ports on 04/13/2010   Method used:   Faxed to ...       [facility]* (mail-order)             ,          Ph: [phone]       Fax: [phone]   RxID:   [id number]

## 2010-12-18 NOTE — Assessment & Plan Note (Signed)
Summary: NP,PLANTAR FASCITIIS,MC   Vital Signs:  Patient profile:   55 year old male BP sitting:   125 / 83  Vitals Entered By: Lillia Pauls CMA (October 23, 2010 9:28 AM)  History of Present Illness: pt here today, pt of dr hopper, with plantar fasciitis since march. pt attribute this to having started walking on the treadmill earlier in march. pt hasnt noticed any swelling associated with this. pt has noticed that biking has eased the pain along with some temp hard 3/4 insoles he has purchased OTC. on exam, pt does have some pes cavus.  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 Morphine 2)  ! Iodine  Physical Exam  General:  Well-developed,well-nourished,in no acute distress; alert,appropriate and cooperative throughout examination Msk:  Rt leg 1 cm longer than lt  mild TTP along left heel good great toe motion foot has slt cavus shape but now shoes pronation at midfoot w some loss of long arch walking gait shows he turns left foot ourward as well as pronating Additional Exam:  MSK [place] PF on LT is 0.76 cms no tears noted but thickening and scarring at insertion into heel this is compared to AT on left and to PF on RT thsee are norm w thickness of about 0.47   Impression & Recommendations:  Problem # 1:  HEEL PAIN, LEFT (ICD-729.5)  This is chronic now since last Feb  no resolution w what he has tried  given stretches and exercise for PF heel lift for leg length diff arch strap  Orders: Sports Insoles (L3510) [place] LIMITED (54098)  Problem # 2:  PLANTAR FASCIITIS, LEFT (ICD-728.71)  Sports insoles added to shoes needs to wear these as much as possible  OK to bike restart some gentle walking on TM and steadily increase as he does his exercises  I  would like to reck in 8 wks  Orders: Sports Insoles (L3510) [place] LIMITED (11914)  Complete Medication List: 1)  Diovan 80 Mg Tabs (Valsartan) .Marland Kitchen.. 1 by mouth qd 2)  Plavix 75 Mg Tabs (Clopidogrel bisulfate) .Marland Kitchen.. 1 by mouth qd 3)  Vytorin 10-20 Mg Tabs (Ezetimibe-simvastatin) .... Take one tablet  by mouth at bedtime 4)  Aspirin 81 Mg Tbec (Aspirin) .... Take one tablet by mouth daily 5)  Klonopin 0.5 Mg Tabs (Clonazepam) .Marland Kitchen.. 1 at bedtime as needed 6)  Multivitamins Tabs (Multiple vitamin) .... Take 1 tablet by mouth once a day 7)  Propranolol Hcl 10 Mg Tabs (Propranolol hcl) .Marland Kitchen.. 1 every hrs as needed   Orders Added: 1)  Consultation Level II [99242] 2)  Sports Insoles [L3510] 3)  [place] LIMITED [78295]

## 2010-12-20 NOTE — Letter (Signed)
Summary: Cardiac Catheterization Instructions- [facility]  [facility], Main Office  [address]   [place], [place] [address]   Phone: [phone]  Fax: [phone]     11/20/2010 MRN: [id number]  [facility] [place] [address] [place], [place]  [address]  Dear Mr. WALDSCHMIDT,   You are scheduled for a Cardiac Catheterization on Wed. 11/21/10 with Dr.McAlhany.  Please arrive to the 1st floor of the Heart and Vascular Center at [facility] at 6:30 am on the day of your procedure. Please do not arrive before 6:30 a.m. Call the Heart and Vascular Center at [phone] if you are unable to make your appointmnet. The Code to get into the parking garage under the building is 0030. Take the elevators to the 1st floor. You must have someone to drive you home. Someone must be with you for the first 24 hours after you arrive home. Please wear clothes that are easy to get on and off and wear slip-on shoes. Do not eat or drink after midnight except water with your medications that morning. Bring all your medications and current insurance cards with you.   _x__ Make sure you take your aspirin.  _x__ You may take ALL of your medications with water that morning. ________________________________________________________________________________________________________________________________    _x__ Eilene Ghazi instructions:  _1) Take Prednisone 20mg  three tablets this afternoon as soon as you pick up your prescription & take Prednisone 20mg  three tablets on arrival to the hospital in the morning. 2) Take benadryl 25mg  by mouth  when you take your prednisone this afternoon. 3) Take pepcid 20mg  this afternoon when you take your prednisone. _______________________________________________________________________________________________________________________________  The usual length of stay after your procedure is 2 to 3 hours. This can vary.  If you have any questions, please call the  office at the number listed above.   Sherri Rad, RN, BSN

## 2010-12-20 NOTE — Assessment & Plan Note (Addendum)
Summary: Cardiology Nuclear Testing  Nuclear Med Background Indications for Stress Test: Evaluation for Ischemia, Stent Patency, PTCA Patency   History: Angioplasty, Echo, GXT, Myocardial Infarction, Myocardial Perfusion Study, Stents  History Comments: '03 Echo-nml Ef =60% / MI /  stent LAD x2; '06 stent LAD & RCA; '09 MPI - fixed defect / EF =51%; '10 Cath/ Angioplasty-instent restenosis LAD 11/21/10 Recath due to CP and patent stents  Symptoms: Chest Pain, Chest Pain with Exertion, DOE, Fatigue, Fatigue with Exertion, Light-Headedness, Palpitations  Symptoms Comments: Last CP 2days ago   Nuclear Pre-Procedure Cardiac Risk Factors: Family History - CAD, Hypertension, Lipids Caffeine/Decaff Intake: none NPO After: 7:00 PM Lungs: clear IV 0.9% NS with Angio Cath: 18g     IV Site: R Antecubital IV Started by: Zachary Stone, EMT-P Chest Size (in) 42     Height (in): 64 Weight (lb): 198 BMI: 34.11  Nuclear Med Study 1 or 2 day study:  1 day     Stress Test Type:  Stress Reading MD:  Kristeen Miss, MD     Referring MD:  Zachary Stone Resting Radionuclide:  Technetium 42m Tetrofosmin     Resting Radionuclide Dose:  11 mCi  Stress Radionuclide:  Technetium 24m Tetrofosmin     Stress Radionuclide Dose:  33 mCi   Stress Protocol Exercise Time (min):  10:00 min     Max HR:  153 bpm     Predicted Max HR:  164 bpm  Max Systolic BP: 149 mm Hg     Percent Max HR:  93.29 %     METS: 11.70 Rate Pressure Product:  45409    Stress Test Technologist:  Cathlyn Parsons, RN     Nuclear Technologist:  Doyne Keel, CNMT  Rest Procedure  Myocardial perfusion imaging was performed at rest 45 minutes following the intravenous administration of Technetium 54m Tetrofosmin.  Stress Procedure  The patient exercised for 10:00.  The patient stopped due to fatigue and severe SOB.  Patient RPE at peak exercise was 17.  Patient denied any chest pain. Patient had frequent PVC's.   There were nonspecific  ST-T wave changes.  Technetium 11m Tetrofosmin was injected at peak exercise and myocardial perfusion imaging was performed after a brief delay.  Dr. Gala Stone reviewed patient stress test results with the pictures.  Patient discharged per instructions of Dr. Gala Stone.  QPS Raw Data Images:  Normal; no motion artifact; normal heart/lung ratio. Stress Images:  There is a moderate sized defect in the anterior apex with normal uptake in the other segments. Rest Images:  There is a moderate sized defect in the anterior apex with normal uptake in the other segments. Subtraction (SDS):  no evidence of ischemia.  There is evidence of a previous anterior apical MI Transient Ischemic Dilatation:  .74  (Normal <1.22)  Lung/Heart Ratio:  .35  (Normal <0.45)  Quantitative Gated Spect Images QGS EDV:  106 ml QGS ESV:  51 ml QGS EF:  51 % QGS cine images:  There is akinesis of the apex with relatively normal contractility of the other segments.  The overall LV function is at the lower limit of normal.  Findings Low risk nuclear study      Overall Impression  Exercise Capacity: Good exercise capacity. BP Response: Hypotensive blood pressure response. Clinical Symptoms: No chest pain.  He had marked dyspnea. ECG Impression: No significant ST segment change suggestive of ischemia.  He had occasional PVCs during the test. Overall Impression: Low risk stress nuclear study. Overall  Impression Comments: There is evidence of a previous anterior apical MI but no evidence of ischemia.  He did develop hypotension at peak exercise.  There is no ischemia.   Appended Document: Cardiology Nuclear Testing Reviewed with patient at the time of office visit.  TS  Appended Document: Cardiology Nuclear Testing The test was ordered after an inconclusive cardiac catheterization with regard to disease severity.  It was set up following his hospitalization, and was required for decision making.   Zachary Stone

## 2010-12-20 NOTE — Progress Notes (Signed)
Summary: pt wants appt to schedule angioplasty/having chest pains  Phone Note Call from Patient   Caller: Patient [phone] Reason for Call: Talk to Nurse Summary of Call: pt wants to see dr Riley Kill asap to schedule an angioplasty, due to chest pain/sob last few weeks-has increased somewhat to the point he feels he needs an angioplasty Initial call taken by: Glynda Jaeger,  November 20, 2010 8:41 AM  Follow-up for Phone Call        I spoke with the pt and he is having CP, SOB and indigestion.  These symptoms have become progressively worse over the last 3-4 months.  The pt feels like he needs to have another cardiac catheterization.  I have scheduled the pt to see Tereso Newcomer PA-C today at 2:00. Follow-up by: Julieta Gutting, RN, BSN,  November 20, 2010 11:09 AM

## 2010-12-20 NOTE — Assessment & Plan Note (Signed)
Summary: ROV   Visit Type:  rov Primary Provider:  Marga Melnick MD  CC:  chest pain worsening over the last 3 months...sob...denies any other complaints today.  History of Present Illness: Primary Cardiologist:  Dr. Shawnie Pons  Zachary Stone is a 55 yo male with a h/o CAD, status post anterior myocardial infarction in 2003 treated bare-metal stenting to the LAD x2.  He subsequently underwent DES placement to the RCA as well the LAD in January 2006.  His last heart catheterization was in September 2010.  This demonstrated in-stent restenosis in the LAD that was treated with PTCA only.  Residual disease included a 40% mid circumflex and serial 40% stenoses in the distal circumflex and less than 10% in-stent restenosis in the RCA with distal 40% disease.  He called today with complaints of recurrent chest pain and was added onto my schedule.  The patient notes left-sided chest discomfort for the last 3 months.  It's been getting worse in frequency and intensity.  He states this is just like what he has had in the past with progressive coronary disease requiring intervention.  His chest pain is somewhat atypical in that it is sharp and lasts a few seconds.  It only occurs at rest.  It generally does not occur with exertion.  He did have some discomfort in his chest while on the stationary bicycle yesterday.  This began with exertion and eventually resolved while he continued to exert himself.  He notes associated shortness of breath.  There is no associated radiation to his arm or jaw.  He denies syncope.  He denies associated nausea or diaphoresis.  He states that there is nothing different about his symptoms when compared to his prior interventions.  He does note that his symptoms with his MI were completely different.  Current Medications (verified): 1)  Diovan 80 Mg  Tabs (Valsartan) .Marland Kitchen.. 1 By Mouth Qd 2)  Plavix 75 Mg  Tabs (Clopidogrel Bisulfate) .Marland Kitchen.. 1 By Mouth Qd 3)  Vytorin 10-20 Mg  Tabs  (Ezetimibe-Simvastatin) .... Take One Tablet  By Mouth At Bedtime 4)  Aspirin 81 Mg Tbec (Aspirin) .... Take One Tablet By Mouth Daily 5)  Klonopin 0.5 Mg  Tabs (Clonazepam) .Marland Kitchen.. 1 At Bedtime As Needed 6)  Multivitamins   Tabs (Multiple Vitamin) .... Take 1 Tablet By Mouth Once A Day  Allergies: 1)  ! Morphine 2)  ! Iodine  Past History:  Past Medical History: Last updated: 09/28/2010 Coronary artery disease--staged PCI in 2006 with DES to RCA and LAD. Myocardial infarction,PMH  of, 2003--non DES stent Hyperlipidemia Hypertension  Past Surgical History: Reviewed history from 09/28/2010 and no changes required. Cholecystectomy PTCA/stent X4, Dr Riley Kill Tonsillectomy Colonoscopy 2007 negative ; redo 2017 Vasectomy, S/P reversal  Family History: Reviewed history from 09/28/2010 and no changes required. Family History High cholesterol M uncle d ? skin cancer  Father: CABG, triple vessel Mother: COAD Siblings: neg  Social History: Reviewed history from 09/28/2010 and no changes required. Occupation: Medical sales representative Married Never Smoked Alcohol use-yes: socially  Review of Systems       He notes indigestion with belching.  Otherwise, as per  the HPI.  All other systems reviewed and negative.   Vital Signs:  Patient profile:   55 year old male Height:      64.25 inches Weight:      199.75 pounds Pulse rate:   56 / minute Pulse rhythm:   irregular BP sitting:   132 / 92  (  left arm) Cuff size:   regular  Vitals Entered By: Danielle Rankin, CMA (November 20, 2010 2:26 PM)  Physical Exam  General:  Well nourished, well developed in no acute distress HEENT: Normal Neck: No JVD Cardiac:  Normal S1, S2; RRR; no murmur Lungs:  Clear to auscultation bilaterally, no wheezing, rhonchi or rales Abd: Soft, nontender, no hepatomegaly, no bruits Ext: No  edema Vascular: No carotid  bruits; Femoral arteries 2+ bilaterally without bruits Skin: Warm and dry MSK:  No  deformities Lymph: No adenopathy Endocrine:  No thyromegaly Neuro: CNs 2-12 intact; nonfocal     EKG  Procedure date:  11/20/2010  Findings:      Sinus Bradycardia Heart rate 56 Q waves in leads 3 and aVF as well as V1-V5 No significant change when compared to prior tracing.  Impression & Recommendations:  Problem # 1:  CHEST PAIN UNSPECIFIED (ICD-786.50) Symptoms atypical but similar to prior angina. D/w Dr. Jens Som who also saw the patient. Will arrange outpatient cath. He will need pretx for iodine allergy.  Orders: TLB-BMP (Basic Metabolic Panel-BMET) (80048-METABOL) TLB-CBC Platelet - w/Differential (85025-CBCD) TLB-PT (Protime) (85610-PTP) TLB-PTT (85730-PTTL) T-2 View CXR (71020TC) EKG w/ Interpretation (93000) Cardiac Catheterization (Cardiac Cath)  Problem # 2:  CORONARY ARTERY DISEASE (ICD-414.00)  As above, arrange cath. Continue ASA, Plavix and statin.  Orders: TLB-BMP (Basic Metabolic Panel-BMET) (80048-METABOL) TLB-CBC Platelet - w/Differential (85025-CBCD) TLB-PT (Protime) (85610-PTP) TLB-PTT (85730-PTTL) T-2 View CXR (71020TC) EKG w/ Interpretation (93000) Cardiac Catheterization (Cardiac Cath)  Problem # 3:  HYPERTENSION (ICD-401.9) Borderline. Continue current meds for now.  Problem # 4:  HYPERLIPIDEMIA (ICD-272.4)  His updated medication list for this problem includes:    Vytorin 10-20 Mg Tabs (Ezetimibe-simvastatin) .Marland Kitchen... Take one tablet  by mouth at bedtime  Patient Instructions: 1)  Labwork today: bmet/cbc/pt/ptt (786.51;414.01). 2)  A chest x-ray takes a picture of the organs and structures inside the chest, including the heart, lungs, and blood vessels. This test can show several things, including, whether the heart is enlarged; whether fluid is building up in the lungs; and whether pacemaker / defibrillator leads are still in place. 3)  Your physician has requested that you have a cardiac catheterization.  Cardiac catheterization  is used to diagnose and/or treat various heart conditions. Doctors may recommend this procedure for a number of different reasons. The most common reason is to evaluate chest pain. Chest pain can be a symptom of coronary artery disease (CAD), and cardiac catheterization can show whether plaque is narrowing or blocking your heart's arteries. This procedure is also used to evaluate the valves, as well as measure the blood flow and oxygen levels in different parts of your heart.  For further information please visit [email or web address]  Please follow instruction sheet, as given. 4)  Your physician recommends that you schedule a follow-up appointment in: 2 weeks with Dr. Riley Kill. Prescriptions: PREDNISONE 20 MG TABS (PREDNISONE) take three tablets NOW & take three tablets on arrival to the hospital in the morning.  #6 x 0   Entered by:   Sherri Rad, RN, BSN   Authorized by:   Tereso Newcomer PA-C   Signed by:   Sherri Rad, RN, BSN on 11/20/2010   Method used:   Electronically to        [facility] (retail)       [address]       [place], [place]  [address]       Ph: [phone]  Fax: [phone]   RxID:   [id number]   I have personally reviewed the prescriptions today for accuracy.. . Tereso Newcomer PA-C  November 20, 2010 4:08 PM

## 2010-12-20 NOTE — Assessment & Plan Note (Signed)
Summary: ROV   Visit Type:  Follow-up Primary Provider:  Marga Melnick MD  CC:  Some chest pains.  History of Present Illness: Zachary Stone is in for a follow up visit.  Overall he is doing well.  He underwent cath in my absence, and now has had a nuc scan.  His films were reviewed with him in detail.  He is actually feeling much better, and starting to do more without symptoms.  We discussed treatment strategy in detail with him.    Problems Prior to Update: 1)  Chest Pain Unspecified  (ICD-786.50) 2)  Plantar Fasciitis, Left  (ICD-728.71) 3)  Routine General Medical Exam@[facility]  (ICD-V70.0) 4)  Idiopathic Tremor  (ICD-781.0) 5)  Hyperplasia Prostate Uns w/o Ur Obst & Oth Luts  (ICD-600.90) 6)  Heel Pain, Left  (ICD-729.5) 7)  Melanoma, Family Hx  (ICD-V16.8) 8)  Seborrheic Keratosis  (ICD-702.19) 9)  Nevus  (ICD-216.9) 10)  Hypertension  (ICD-401.9) 11)  Hyperlipidemia  (ICD-272.4) 12)  Abdominal Pain, Right Upper Quadrant  (ICD-789.01) 13)  Candidiasis, Skin  (ICD-112.3) 14)  Fh of Coronary Artery Bypass Graft, Hx of  (ICD-V45.81) 15)  Fh of Hx, Family, Diabetes Mellitus  (ICD-V18.0) 16)  Fh of Brain Cancer  (ICD-191.9) 17)  Epicondylitis, Hx of  (ICD-V49.5) 18)  Myocardial Infarction, Hx of  (ICD-412) 19)  Coronary Artery Disease  (ICD-414.00)  Current Medications (verified): 1)  Diovan 80 Mg  Tabs (Valsartan) .Marland Kitchen.. 1 By Mouth Qd 2)  Plavix 75 Mg  Tabs (Clopidogrel Bisulfate) .Marland Kitchen.. 1 By Mouth Qd 3)  Vytorin 10-20 Mg  Tabs (Ezetimibe-Simvastatin) .... Take One Tablet  By Mouth At Bedtime 4)  Aspirin 81 Mg Tbec (Aspirin) .... Take One Tablet By Mouth Daily 5)  Klonopin 0.5 Mg  Tabs (Clonazepam) .Marland Kitchen.. 1 At Bedtime As Needed 6)  Multivitamins   Tabs (Multiple Vitamin) .... Take 1 Tablet By Mouth Once A Day  Allergies: 1)  ! Morphine 2)  ! Iodine  Vital Signs:  Patient profile:   55 year old male Height:      64 inches Weight:      197.75 pounds BMI:     34.07 Pulse  rate:   68 / minute Pulse rhythm:   regular Resp:     18 per minute BP sitting:   120 / 84  (left arm) Cuff size:   large  Vitals Entered By: Vikki Ports (December 07, 2010 9:27 AM)  Physical Exam  General:  Well developed, well nourished, in no acute distress. Head:  normocephalic and atraumatic Eyes:  PERRLA/EOM intact; conjunctiva and lids normal. Lungs:  Clear bilaterally to auscultation and percussion. Heart:  Occasional extrasystole.  Normal S1 and S2.  No murmur or rub.  Pos S4.   Extremities:  No clubbing or cyanosis. Neurologic:  Alert and oriented x 3.   Nuclear ETT  Procedure date:  11/27/2010  Findings:      Overall Impression   Exercise Capacity: Good exercise capacity. BP Response: Hypotensive blood pressure response. Clinical Symptoms: No chest pain.  He had marked dyspnea. ECG Impression: No significant ST segment change suggestive of ischemia.  He had occasional PVCs during the test. Overall Impression: Low risk stress nuclear study. Overall Impression Comments: There is evidence of a previous anterior apical MI but no evidence of ischemia.  He did develop hypotension at peak exercise.  There is no ischemia.     Signed by Kristeen Miss MD on 11/27/2010 at 3:48 PM  Cardiac  Cath  Procedure date:  11/21/2010  Findings:      IMPRESSION: 1. Double-vessel coronary artery disease. 2. Moderate in-stent restenosis in the mid left anterior descending     artery. 3. Hypokinesis of the apical segment of the left ventricle with     overall preserved left ventricular systolic function.   RECOMMENDATIONS:  The in-stent restenosis in the patient's mid LAD is moderate by angiogram.  Options at this time would be to move the patient upstairs and perform a flow wire or to let him go home today and bring him into the office for an exercise stress Myoview.  I will discuss his case with Dr. Riley Kill.  I will most likely set him up for an outpatient Myoview.  I will  have him follow up with Dr. Riley Kill after his Myoview.  EKG  Procedure date:  12/07/2010  Findings:      NSR.  Old anteroinferoapical infarct.  No acute changes.   Impression & Recommendations:  Problem # 1:  CORONARY ARTERY DISEASE (ICD-414.00) He and I reviewed the films in detail.  He is doing well, and the LAD territory is mostly scar.  There is moderate in stent restenosis.  However, he is doing well.  He and I agreed that we would continue to follow him closely.  He prefers a conservative course of action. His updated medication list for this problem includes:    Plavix 75 Mg Tabs (Clopidogrel bisulfate) .Marland Kitchen... 1 by mouth qd    Aspirin 81 Mg Tbec (Aspirin) .Marland Kitchen... Take one tablet by mouth daily  Problem # 2:  HYPERTENSION (ICD-401.9) His pressure is well controlled.  Continue current medication. His updated medication list for this problem includes:    Diovan 80 Mg Tabs (Valsartan) .Marland Kitchen... 1 by mouth qd    Aspirin 81 Mg Tbec (Aspirin) .Marland Kitchen... Take one tablet by mouth daily  Problem # 3:  HYPERLIPIDEMIA (ICD-272.4) appropriate recheck of lipid and liver at appropriate time.  His updated medication list for this problem includes:    Vytorin 10-20 Mg Tabs (Ezetimibe-simvastatin) .Marland Kitchen... Take one tablet  by mouth at bedtime  Patient Instructions: 1)  Your physician recommends that you continue on your current medications as directed. Please refer to the Current Medication list given to you today. 2)  Your physician wants you to follow-up in:  3 MONTHS.  You will receive a reminder letter in the mail two months in advance. If you don't receive a letter, please call our office to schedule the follow-up appointment.

## 2010-12-20 NOTE — Assessment & Plan Note (Signed)
Summary: SORE THROAT/RH........Marland Kitchen   Vital Signs:  Patient profile:   55 year old male Weight:      196.2 pounds Temp:     98.3 degrees F oral BP sitting:   122 / 80  (left arm) Cuff size:   large  Vitals Entered By: Almeta Monas CMA Duncan Dull) (December 13, 2010 4:04 PM) CC: x 3 days c/o  the worst sore throat ever, cough and  chest congestion, URI symptoms   History of Present Illness:       This is a 55 year old man who presents with URI symptoms.  The symptoms began 3 days ago.  Pt is taking, mucinex, IB,  dayquil and sudafed with little relief.   .  The patient complains of sore throat and productive cough, but denies nasal congestion, clear nasal discharge, purulent nasal discharge, dry cough, earache, and sick contacts.  The patient denies fever, low-grade fever (<100.5 degrees), fever of 100.5-103 degrees, fever of 103.1-104 degrees, fever to >104 degrees, stiff neck, dyspnea, wheezing, rash, vomiting, diarrhea, use of an antipyretic, and response to antipyretic.  The patient denies the following risk factors for Strep sinusitis: unilateral facial pain, unilateral nasal discharge, poor response to decongestant, double sickening, tooth pain, Strep exposure, tender adenopathy, and absence of cough.    Current Medications (verified): 1)  Diovan 80 Mg  Tabs (Valsartan) .Marland Kitchen.. 1 By Mouth Qd 2)  Plavix 75 Mg  Tabs (Clopidogrel Bisulfate) .Marland Kitchen.. 1 By Mouth Qd 3)  Vytorin 10-20 Mg  Tabs (Ezetimibe-Simvastatin) .... Take One Tablet  By Mouth At Bedtime 4)  Aspirin 81 Mg Tbec (Aspirin) .... Take One Tablet By Mouth Daily 5)  Klonopin 0.5 Mg  Tabs (Clonazepam) .Marland Kitchen.. 1 At Bedtime As Needed 6)  Multivitamins   Tabs (Multiple Vitamin) .... Take 1 Tablet By Mouth Once A Day 7)  Amoxicillin 875 Mg Tabs (Amoxicillin) .Marland Kitchen.. 1 By Mouth Two Times A Day  Allergies (verified): 1)  ! Morphine 2)  ! Iodine  Past History:  Past Medical History: Last updated: 09/28/2010 Coronary artery disease--staged PCI in  2006 with DES to RCA and LAD. Myocardial infarction,PMH  of, 2003--non DES stent Hyperlipidemia Hypertension  Past Surgical History: Last updated: 09/28/2010 Cholecystectomy PTCA/stent X4, Dr Riley Kill Tonsillectomy Colonoscopy 2007 negative ; redo 2017 Vasectomy, S/P reversal  Family History: Last updated: 09/28/2010 Family History High cholesterol M uncle d ? skin cancer  Father: CABG, triple vessel Mother: COAD Siblings: neg  Social History: Last updated: 09/28/2010 Occupation: Medical sales representative Married Never Smoked Alcohol use-yes: socially  Risk Factors: Exercise: yes (08/29/2008)  Risk Factors: Smoking Status: never (08/29/2008)  Family History: Reviewed history from 09/28/2010 and no changes required. Family History High cholesterol M uncle d ? skin cancer  Father: CABG, triple vessel Mother: COAD Siblings: neg  Social History: Reviewed history from 09/28/2010 and no changes required. 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  Otoscopic examination reveals clear canals, tympanic membranes are intact bilaterally without bulging, retraction, inflammation or discharge. Hearing is grossly normal bilaterally. Nose:  External nasal examination shows no deformity or inflammation. Nasal mucosa are pink and moist without lesions or exudates. Mouth:  no exudates and pharyngeal erythema.   Neck:  No deformities, masses, or tenderness noted. Lungs:  Normal  respiratory effort, chest expands symmetrically. Lungs are clear to auscultation, no crackles or wheezes. Heart:  Normal rate and regular rhythm. S1 and S2 normal without gallop, murmur, click, rub or other extra sounds. Extremities:  No  clubbing, cyanosis, edema, or deformity noted with normal full range of motion of all joints.   Skin:  Intact without suspicious lesions or rashes Cervical Nodes:  No lymphadenopathy noted   Impression & Recommendations:  Problem # 1:  ACUTE PHARYNGITIS (ICD-462)  His updated medication list for this problem includes:    Aspirin 81 Mg Tbec (Aspirin) .Marland Kitchen... Take one tablet by mouth daily    Amoxicillin 875 Mg Tabs (Amoxicillin) .Marland Kitchen... 1 by mouth two times a day  Orders: T-Culture, Throat (81191-47829) Rapid Strep (56213)  Instructed to complete antibiotics and call if not improved in 48 hours.   Complete Medication List: 1)  Diovan 80 Mg Tabs (Valsartan) .Marland Kitchen.. 1 by mouth qd 2)  Plavix 75 Mg Tabs (Clopidogrel bisulfate) .Marland Kitchen.. 1 by mouth qd 3)  Vytorin 10-20 Mg Tabs (Ezetimibe-simvastatin) .... Take one tablet  by mouth at bedtime 4)  Aspirin 81 Mg Tbec (Aspirin) .... Take one tablet by mouth daily 5)  Klonopin 0.5 Mg Tabs (Clonazepam) .Marland Kitchen.. 1 at bedtime as needed 6)  Multivitamins Tabs (Multiple vitamin) .... Take 1 tablet by mouth once a day 7)  Amoxicillin 875 Mg Tabs (Amoxicillin) .Marland Kitchen.. 1 by mouth two times a day Prescriptions: AMOXICILLIN 875 MG TABS (AMOXICILLIN) 1 by mouth two times a day  #20 x 0   Entered and Authorized by:   Loreen Freud DO   Signed by:   Loreen Freud DO on 12/13/2010   Method used:   Electronically to        [facility] (retail)       [address]       [place], [place]  [address]       Ph: [phone]       Fax: [phone]   RxID:   [id number]    Orders Added: 1)  T-Culture, Throat [[id number]] 2)  Est. Patient Level III [29518] 3)  Rapid Strep [84166]

## 2010-12-20 NOTE — Progress Notes (Signed)
Summary: nuc pre-procedure  Phone Note Outgoing Call   Call placed by: Domenic Polite, CNMT,  November 26, 2010 11:43 AM Call placed to: Patient Reason for Call: Confirm/change Appt Summary of Call: Reviewed information on Myoview Information Sheet (see scanned document for further details).  Spoke with patient.       Nuclear Med Background Indications for Stress Test: Evaluation for Ischemia, Stent Patency, PTCA Patency   History: Angioplasty, Echo, GXT, Myocardial Infarction, Myocardial Perfusion Study, Stents  History Comments: '03 Echo-nml Ef =60% / MI /  stent LAD x2; '06 stent LAD & RCA; '09 MPI - fixed defect / EF =51%; '10 Cath/ Angioplasty-instent restenosis LAD  Symptoms: Chest Pain, Chest Pain with Exertion, SOB    Nuclear Pre-Procedure Cardiac Risk Factors: Family History - CAD, Hypertension, Lipids Height (in): 64.25

## 2011-02-04 ENCOUNTER — Telehealth: Payer: Self-pay | Admitting: Cardiology

## 2011-02-14 NOTE — Progress Notes (Signed)
Summary: pt calling re code for stress test  Phone Note Call from Patient   Caller: Patient [phone] Reason for Call: Talk to Nurse Summary of Call: pt calling re getting dr Riley Kill to recode for his stress test Initial call taken by: Glynda Jaeger,  February 04, 2011 2:19 PM  Follow-up for Phone Call        The pt said that the test does not need to be coded as outpatient diagnositic testing.  Needs to be linked to cardiac cath if this can be changed.  The way test is currently coded will cost the pt $4500 out of pocket. The pt would like a return phone call from our billing department in regards to this issue.  Follow-up by: Julieta Gutting, RN, BSN,  February 04, 2011 3:35 PM  Additional Follow-up for Phone Call Additional follow up Details #1::        I spoke with Cydney Ok and she will contact this pt about billing information. Julieta Gutting, RN, BSN  February 05, 2011 11:01 AM

## 2011-02-18 ENCOUNTER — Other Ambulatory Visit: Payer: Self-pay | Admitting: Cardiology

## 2011-02-18 MED ORDER — CLOPIDOGREL BISULFATE 75 MG PO TABS: 75.0000 mg | ORAL_TABLET | Freq: Every day | ORAL | Status: DC

## 2011-02-18 MED ORDER — VALSARTAN 80 MG PO TABS: 80.0000 mg | ORAL_TABLET | Freq: Every day | ORAL | Status: DC

## 2011-02-18 NOTE — Telephone Encounter (Signed)
Needs refill of plavix 30 day supply [facility] pt out needs asap and plavix and diovan at  [facility]   [phone] fax or [phone] phone, for 1 year supply

## 2011-02-21 ENCOUNTER — Encounter: Payer: Self-pay | Admitting: Internal Medicine

## 2011-02-21 ENCOUNTER — Ambulatory Visit (INDEPENDENT_AMBULATORY_CARE_PROVIDER_SITE_OTHER): Payer: 59 | Admitting: Internal Medicine

## 2011-02-21 DIAGNOSIS — H9190 Unspecified hearing loss, unspecified ear: Secondary | ICD-10-CM

## 2011-02-21 DIAGNOSIS — H6120 Impacted cerumen, unspecified ear: Secondary | ICD-10-CM

## 2011-02-21 NOTE — Patient Instructions (Signed)
Please do not use Q-tips as we discussed. Should wax build up occur, please put 2-3 drops of mineral oil in the ear at night and cover the canal with a  cotton ball. In the morning fill the canal with hydrogen peroxide & leave  for 10-15 minutes. Following this shower and use the thinnest washrag available to wick out the wax.  

## 2011-02-21 NOTE — Progress Notes (Signed)
  Subjective:    Patient ID: Zachary Stone, male    DOB: Nov 17, 1956, 55 y.o.   MRN: [id number]  HPI  He has had sensation of pressure in his ears with hearing loss for several months which has been progressive over the past 2-3 weeks. He has chronic tinnitus which he feels is getting worse. All the symptoms are worse in the left ear than the right.  There was no trigger for this event; specifically he  denies symptoms of rhinosinusitis :nasal congestion/obstruction; nasal purulence; facial pain;  fever; headache; or  dental pain.   Review of Systems     Objective:   Physical Exam General appearance is one of good health and nourishment. Skull is normocephalic without lymphadenopathy about the head, neck, or axilla. See current vital signs.Oral exam: Dental hygiene is good; lips and gums are healthy appearing.There is no oropharyngeal erythema or exudate noted. Lymphatic: No lymphadenopathy is noted about the head, neck, or axilla.Ears:  External ear exam shows no significant lesions or deformities.  Otoscopic examination reveals clear canal, tympanic membrane, without bulging, retraction, inflammation or discharge on R .There is a wax impaction on L. Hearing is grossly normal to whisper on R @ 6 ft.  After soaking the left ear with hydrogen  peroxide and repeated gavage, a  Cerumen  cast of the canal was removed. Following this he was able to hear whisper at 6 feet on the left.        Assessment & Plan:  #1 hearing loss due to #2  #2 dense  cerumen impaction; removal completely following soaking  and gavage.

## 2011-02-22 LAB — CBC
HCT: 44.9 % (ref 39.0–52.0)
HCT: 48.8 % (ref 39.0–52.0)
HCT: 49.9 % (ref 39.0–52.0)
Hemoglobin: 15.5 g/dL (ref 13.0–17.0)
Hemoglobin: 17.4 g/dL — ABNORMAL HIGH (ref 13.0–17.0)
MCHC: 35 g/dL (ref 30.0–36.0)
MCHC: 34.8 g/dL (ref 30.0–36.0)
MCV: 95.8 fL (ref 78.0–100.0)
Platelets: 195 10*3/uL (ref 150–400)
RBC: 4.69 MIL/uL (ref 4.22–5.81)
RDW: 12.9 % (ref 11.5–15.5)
RDW: 12.8 % (ref 11.5–15.5)
WBC: 12.2 10*3/uL — ABNORMAL HIGH (ref 4.0–10.5)
WBC: 14.9 10*3/uL — ABNORMAL HIGH (ref 4.0–10.5)

## 2011-02-22 LAB — DIFFERENTIAL
Basophils Relative: 0 % (ref 0–1)
Eosinophils Absolute: 0.1 10*3/uL (ref 0.0–0.7)
Eosinophils Relative: 1 % (ref 0–5)
Lymphocytes Relative: 12 % (ref 12–46)
Monocytes Relative: 7 % (ref 3–12)
Neutro Abs: 11.9 10*3/uL — ABNORMAL HIGH (ref 1.7–7.7)

## 2011-02-22 LAB — BASIC METABOLIC PANEL
CO2: 24 mEq/L (ref 19–32)
Calcium: 9.5 mg/dL (ref 8.4–10.5)
Chloride: 102 mEq/L (ref 96–112)
Chloride: 107 mEq/L (ref 96–112)
Creatinine, Ser: 0.91 mg/dL (ref 0.4–1.5)
Creatinine, Ser: 0.85 mg/dL (ref 0.4–1.5)
Glucose, Bld: 108 mg/dL — ABNORMAL HIGH (ref 70–99)
Potassium: 3.3 mEq/L — ABNORMAL LOW (ref 3.5–5.1)

## 2011-02-22 LAB — POCT I-STAT, CHEM 8
BUN: 13 mg/dL (ref 6–23)
Chloride: 102 mEq/L (ref 96–112)
Creatinine, Ser: 0.8 mg/dL (ref 0.4–1.5)
Glucose, Bld: 104 mg/dL — ABNORMAL HIGH (ref 70–99)
HCT: 50 % (ref 39.0–52.0)
Hemoglobin: 17 g/dL (ref 13.0–17.0)
Sodium: 136 mEq/L (ref 135–145)
TCO2: 25 mmol/L (ref 0–100)

## 2011-02-22 LAB — POCT CARDIAC MARKERS: Troponin i, poc: <0.05 ng/mL (ref 0.00–0.09)

## 2011-03-07 ENCOUNTER — Telehealth: Payer: Self-pay | Admitting: Internal Medicine

## 2011-03-07 MED ORDER — CLONAZEPAM 0.5 MG PO TABS: 0.5000 mg | ORAL_TABLET | Freq: Every evening | ORAL | Status: DC | PRN

## 2011-03-07 NOTE — Telephone Encounter (Signed)
Patient wants rx for clonazepam.5mg  90 day supply sent to new pharmacy - [facility]

## 2011-04-02 NOTE — Letter (Signed)
September 12, 2008    Marga Melnick, MD  [phone]  [place], [place]  [address]   RE:  [name], [name]  MRN:  [id number]  /  DOB:  1955-12-10   Dear Jearld Adjutant,   I had the pleasure seeing your nice patient Zachary Stone in the office  today in followup.  As you know, cardiac wise, he has been getting along  well.  He has had some discomfort in the back and lateral abdomen, which  he thought it was related in part to his kayaking activities.  I do know  that this led to a CT scan.  The CT scan was called by your office to  the patient.  He denies any ongoing chest pain or shortness of breath.  Overall, he feels well.  He has not had a lipid in some time.   His medications include Plavix 75 mg daily, Vytorin 10/20 nightly,  aspirin 81 mg daily, Diovan 80 mg daily, and multivitamin daily.   On physical, he is alert and oriented, in no distress.  The blood  pressure is 132/90 and the pulse is 52.  The lung fields are clear.  The  cardiac rhythm is regular.  The extremities revealed no edema.   EKG reveals sinus bradycardia.  There is evidence of an anterior and  inferior infarct of indeterminate age.  This is compatible with delayed  R-wave progression also in the anterior leads.   Overall, this gentleman is doing well from a cardiac standpoint.  We  have recommended continued dual antiplatelet therapy.  His last  Cardiolite study demonstrates anteroapical infarct  without ischemia.  An ejection fraction of 51%.  He will remain on dual  antiplatelet therapy, and we will see him back in followup in 6 months.   I appreciate the opportunity of sharing this nice gentleman's care.    Sincerely,      Arturo Morton. Riley Kill, MD, [facility]  Electronically Signed    TDS/MedQ  DD: 09/12/2008  DT: 09/13/2008  Job #: [id number]

## 2011-04-02 NOTE — Assessment & Plan Note (Signed)
[facility]                            CARDIOLOGY OFFICE NOTE   Zachary Stone, Zachary Stone                         MRN:          [id number]  DATE:03/30/2008                            DOB:          1956/01/23    Zachary Stone is in for followup.  He has recently had a radionuclide  imaging study.  Importantly, his symptoms are somewhat atypical; as he  exercises more they seem to go away, however the frequency of brief mini  chest pains is increased.  They last anywhere from 5 seconds to 1  minute, more likely 5 seconds.  There is no transmission.  He has been  under more job stress.  He underwent radionuclide imaging with exercise  stress.  He went 10 minutes on the Bruce protocol.  There were no  electrocardiographic abnormalities.  He did have frequent PVCs which are  chronic.  Myocardial perfusion images demonstrated a fixed defect in the  anteroapical segment, consistent with what he has had previously.   CURRENT MEDICATIONS:  1. Include Plavix 75 mg daily.  2. Vytorin 10/20 daily at bedtime.  3. Aspirin 81 mg daily.  4. Diovan 80 mg daily.  5. Multivitamin daily.   PHYSICAL EXAMINATION:  GENERAL:  On physical exam today he is alert and  oriented.  In no distress.  VITAL SIGNS:  Weight is 194 pounds.  Blood pressure 102/60, pulse is 64.  NECK:  There is no jugular venous distention.  The carotid upstrokes are  brisk.  LUNGS:  Lung fields are clear to auscultation and percussion.  CARDIAC:  Cardiac rhythm is regular.  PMI is nondisplaced.  There is  normal first and second heart sound without murmur, rub or gallop.  ABDOMEN:  Abdomen is soft.   EKG reveals sinus bradycardia, anterior infarction of indeterminate age.  There are some small inferior Qs, possibly the residual of his anterior  MI.   IMPRESSION:  1. Coronary artery disease with prior percutaneous stenting, and mild      partial restenosis of the left anterior descending artery.  2.  Hypercholesterolemia on lipid lowering therapy with last LDL      recently of 78, according to the patient.  3. History of multiple drug eluting stents.   DISPOSITION:  We will continue medical therapy.  If there is a marked  increase in change in the patient's frequency he is to contact us.  We  reviewed his nuclear study today together and we explained possible  options.  If the frequency of symptoms would increase then we would  consider doing a restudy to reevaluate his coronary anatomy.  At the  present time, however, we will continue on a conservative course.     Arturo Morton. Riley Kill, MD, [facility]  Electronically Signed    TDS/MedQ  DD: 03/30/2008  DT: 03/30/2008  Job #: [id number]

## 2011-04-02 NOTE — Assessment & Plan Note (Signed)
[facility]                            CARDIOLOGY OFFICE NOTE   Zachary Stone, Zachary Stone                         MRN:          [id number]  DATE:02/17/2008                            DOB:          28-Sep-1956    PRIMARY CARDIOLOGIST:  Zachary Stone. Zachary Kill, MD.   [name] is a 55 year old white male patient of Dr. Bonnee Stone who has  history of coronary artery disease, status post anterior wall MI,  treated with Emerald stent to the mid LAD in 2003.  In 2006 he had a new  Cypher drug-eluting stent in the proximal RCA.  He did have some  progression of disease in the LAD proximal to the previous-placed  stents.  He had scattered irregularities in the circumflex and had  preserved LV function.  Later that month he underwent successful  stenting of the LAD on December 13, 2004.   The patient comes in today with increased frequency of chest pain.  He  said he ever since his heart attack he has had recurrent chest pain  described as a soreness and occasional tightness.  This occurs 4-5 times  a day.  He says now it is occurring 6-20 times a day.  It is quick,  fleeting, and he does not use nitroglycerin.  He has no associated  symptoms with it.  It is not exercise-induced.  He says he has been  eating terrible and not exercising regularly and thinks if he gets on a  strong exercise program and strict diet that he will reduce his chest  pain.  He feels like it is anxiety-related and occurs often at rest.  On  a scale of 1-10 he says it is a 1-2.  He walked this morning 40 minutes  quite vigorously up and down hills and had no problems at all.   CURRENT MEDICATIONS:  1. Plavix 75 mg daily.  2. Vytorin 10/20 mg daily.  3. Aspirin 81 mg daily.  4. Diovan 80 mg daily.  5. Multivitamin daily.   PHYSICAL EXAM:  This is a pleasant, somewhat anxious 55 year old white  male in no acute distress.  Blood pressure 112/80, pulse 54, weight 195.  NECK:  Without JVD, HJR,  bruit or thyroid enlargement.  LUNGS:  Clear anterior, posterior and lateral.  HEART:  Regular rate and rhythm at 54 beats per minute, normal S1 and  S2.  No murmur, rub, bruit, thrill or heave noted.  ABDOMEN:  Soft without organomegaly, masses, lesions or abnormal  tenderness.  EXTREMITIES:  Without cyanosis, clubbing or edema.  There are good  distal pulses.   EKG:  Sinus bradycardia, prior anterior MI, and no acute change.   IMPRESSION:  1. Chest pain with increased frequency related to anxiety.  Must rule      out progression of coronary artery disease.  2. History of coronary artery disease, status post anterior wall      myocardial infarction and treated with stent in 2003.  3. Status post Cypher stenting of the right coronary artery on December 04, 2004.  4. Stenting of the proximal left anterior descending artery on December 13, 2004.  5. Hypertension.  6. Hyperlipidemia.  7. Anxiety   PLAN:  At this time we will schedule the patient for a stress Cardiolite  before he begins a strenuous exercise program.  I think most of his  symptoms are stress-related but with his history of coronary artery  disease, we must rule out progression.  He is due to see Dr. Riley Stone  back for his yearly follow-up so we will schedule that after his stress  test is performed.      Zachary Reedy, PA-C  Electronically Signed      Zachary Salvia, MD, [facility]  Electronically Signed   Zachary Stone/MedQ  DD: 02/17/2008  DT: 02/17/2008  Job #: [id number]

## 2011-04-02 NOTE — Assessment & Plan Note (Signed)
[facility]                                 ON-CALL NOTE   Zachary Stone, Zachary Stone                         MRN:          [id number]  DATE:03/19/2007                            DOB:          1956-06-04    Zachary Stone called the answering service in the evening of Mar 19, 2007.  I  returned his call.  He had had a colonoscopy 2 days prior.  No polyps  removed.  He had what he thought was a low-grade fever, he felt warm.  He did not have a thermometer.  He had absolutely no other symptoms,  i.e., no pain, no bowel movement changes, no bleeding.  I told him I  thought that this was unlikely to be related to his colonoscopy.  However, if he had development of abdominal pain, nausea, vomiting,  bleeding, bowel habit changes or felt worse, to call us back at any  time.  If he went on to develop a syndrome consistent with a virus and  he felt poorly, he should follow up with his primary care physician.     Iva Boop, MD,FACG  Electronically Signed    CEG/MedQ  DD: 03/20/2007  DT: 03/20/2007  Job #: [id number]   cc:   Venita Lick. Russella Dar, MD, [facility]

## 2011-04-02 NOTE — Assessment & Plan Note (Signed)
[facility]                            CARDIOLOGY OFFICE NOTE   NAME:BURNSTyrese, Capriotti                         MRN:          [id number]  DATE:03/20/2007                            DOB:          February 27, 1956    Mr. Muhl is in for followup, he is really doing quite well, he denies  any ongoing significant chest pain, he has a rare episode that does not  sound activity provoked.  The symptoms are not suggestive of ischemia.  He remains relatively active, although he is working at home and not  riding his bike as much.   MEDICATIONS:  1. Plavix 75 mg daily.  2. Vytorin 10/20 q.h.s.  3. Aspirin 81 mg daily.  4. Diovan 80 mg daily.   PHYSICAL EXAMINATION:  He is an alert, oriented gentleman in no  distress, weight 195, blood pressure 126/76 and pulse 77, the lung  fields are clear and the cardiac rhythm is regular, there is an S4  gallop.   The electrocardiogram demonstrates normal sinus rhythm, there is a  septal infarct of indeterminate age, there are small inferior Qs.   IMPRESSION:  1. Coronary artery disease with prior multivessel coronary artery      disease.  2. Hypercholesterolemia, with last LDL at target.  3. Multiple drug-eluting stents.   RECOMMENDATIONS:  1. Continue current medical regimen.  2. Return to clinic in 1 year.  3. The patient needs a lipid liver profile in July.     Arturo Morton. Riley Kill, MD, [facility]  Electronically Signed    TDS/MedQ  DD: 03/20/2007  DT: 03/20/2007  Job #: [id number]   cc:   Titus Dubin. Alwyn Ren, MD,FACP,FCCP

## 2011-04-05 NOTE — H&P (Signed)
NAMEAARYAN, ESSMAN                ACCOUNT NO.:  [id number]   MEDICAL RECORD NO.:  [id number]          PATIENT TYPE:  INP   LOCATION:  1827                         FACILITY:  [facility]   PHYSICIAN:  Arvilla Meres, M.D. LHCDATE OF BIRTH:  November 17, 1956   DATE OF ADMISSION:  04/21/2005  DATE OF DISCHARGE:                                HISTORY & PHYSICAL   PRIMARY CARE PHYSICIAN:  Dr. Marga Melnick.   CARDIOLOGIST:  Dr. Bonnee Quin.   PATIENT IDENTIFICATION:  Zachary [name] is a 55 year old male with a history of  CAD status post anterior MI in July 2003, treated with emergent stenting of  his LAD, and also percutaneous intervention on the RCA and LAD in January  2006 who is being admitted with recurrent chest pain.   Mr. Matsuo reports a one-month history of progressive atypical chest pain.  He says the episodes are typically fleeting and lasting just a few seconds.  These can come with rest or exertion.  They are not associated with any  radiation, shortness of breath, nausea, vomiting or diaphoresis.  He has  been riding his bike eight miles back and forth to work, typically without  any difficulty.  He does state that on Mondays, he does have significant  chest pain while riding, but then throughout the week, this clears up.  He  denies any change in his exercise tolerance.  On Friday, he had multiple  episodes of fleeting chest pain throughout the day but nothing prolonged.  Earlier this morning, approximately at 1 a.m., he woke up after a dream with  left-sided chest pain, which was quite severe.  The pain got much worse with  inspiration.  This occurred multiple times throughout the morning, so he  finally came to the emergency room.  In the emergency room, he had an EKG  and two sets of point-of-care markers which did not show any evidence of  ischemia.  He is now chest-pain-free without intervention.  In talking with  him more about the chest pain, he feels that although the pain is  atypical,  it is very similar to his previous angina.  He has been compliant with his  Plavix therapy.   REVIEW OF SYSTEMS:  He denies any fevers, chills, nausea or vomiting.  He  has not had any bleeding.  He has not had any stroke-like symptoms.  He has  not had any heart failure symptoms.  All other systems are negative.   PAST MEDICAL HISTORY:  1.  CAD.      1.  Status post acute anterior wall myocardial infarction in July 2003,          treated with stenting of his LAD.      2.  Cardiac catheterization in January 2006 secondary to chest pain          showed an EF of 50-55%.  The left main was normal.  The LAD had 40%          proximal narrowing with diffuse disease throughout the proximal          vessel.  There was a 70-80% narrowing just prior to the takeoff of          the first diagonal.  This had been followed by overlapping 2.25-mm          Express stents which were widely patent.  The circumflex was a large          vessel with a 30-50% proximal lesion and 40% in the mid vessel.          There was 30% narrowing in the marginal vessel.  The right coronary          had an 80% proximal stenosis with a 30-40% narrowing following this.      3.  Status post PTCA and stenting of proximal right coronary artery with          a 3.0 Cypher drug-eluting stent.  This was then followed by PTCA and          stenting of the proximal LAD prior to the previous stents.  2.  Hypertension.  3.  Hyperlipidemia.  4.  Gallstones, status post cholecystectomy.  5.  Allergies to clams but not shrimp.  He has received contrast allergy      prophylaxis prior to previous catheterizations.   MEDICATIONS:  1.  Aspirin 325 mg daily.  2.  Diovan 80 mg daily.  3.  Plavix 75 mg daily.  4.  Lipitor 40 mg daily.  5.  Lopressor 12.5 mg b.i.d.   ALLERGIES:  No known drug allergies.  He does report an allergy to clams but  no other shellfish, including shrimp.  He has received contrast dye allergy   prophylaxis in the past.   SOCIAL HISTORY:  He lives in [place] with his wife.  He is a [employer] officer.  He also is a Water quality scientist.  He has never smoked tobacco on a regular  basis.  He drinks alcohol only rarely.  He has three daughters.   FAMILY HISTORY:  Notable only for his father, who is in his early 51s and is  status post CABG.   PHYSICAL EXAMINATION:  GENERAL:  He is lying in bed in no acute distress.  Respirations are unlabored.  VITAL SIGNS:  Blood pressure 130/90 with a heart rate 60.  He is satting 98%  on room air.  His respirations are 15.  HEENT:  Sclerae are anicteric.  EOMI.  There is no xanthelasma.  Mucous  membranes are moist.  NECK:  Supple.  There is no JVD.  Carotids are 2+ bilaterally without any  bruits.  There is no lymphadenopathy or thyromegaly.  LUNGS:  Clear to auscultation.  ABDOMEN:  Soft, nontender and nondistended with good bowel sounds.  There is  no hepatosplenomegaly or bruits.  There are no masses.  EXTREMITIES:  Warm with no cyanosis, clubbing or edema.  Femoral pulses are  2+ bilaterally without bruits.  Distal pulses are 2+ bilaterally.  NEUROLOGIC:  He has normal affect.  Alert and oriented x 3.  Cranial nerves  are intact.  Otherwise nonfocal.   LABORATORY:  Sodium 139, potassium 3.6, chloride 104, bicarb 28, BUN 19,  creatinine 1.0, glucose 91.  Point-of-care markers are negative x 2 with a  CK-MB of less than 1.0 and a troponin of less than 0.05.  EKG shows sinus  bradycardia at a rate of 55.  There are old anterior Q waves.  No acute ST/T  wave changes.  Chest x-ray shows no acute disease.   ASSESSMENT/PLAN:  Chest pain.  This is very atypical but clearly reminiscent  of his previous angina.  We will admit for a rule-out MI.  I have suggested  cardiac catheterization, but he was initially very reluctant about this.  I spent an extensive amount of time explaining to him the risks and benefits  of the procedure as well as some  possible alternatives, including a stress  test.  After this discussion, he has decided to proceed with  catheterization.  For now, we will continue him on aspirin, Plavix, Lipitor  and Toprol.  We will start IV heparin.  He will receive prophylaxis for a  possible contrast dye allergy.       DB/MEDQ  D:  04/21/2005  T:  04/21/2005  Job:  [id number]

## 2011-04-05 NOTE — Discharge Summary (Signed)
NAMEZACARIAH, BELUE                ACCOUNT NO.:  [id number]   MEDICAL RECORD NO.:  [id number]          PATIENT TYPE:  INP   LOCATION:  6526                         FACILITY:  [facility]   PHYSICIAN:  Arvilla Meres, M.D. LHCDATE OF BIRTH:  April 22, 1956   DATE OF ADMISSION:  12/03/2004  DATE OF DISCHARGE:  12/05/2004                           DISCHARGE SUMMARY - REFERRING   PROCEDURE:  Cardiac catheterization on December 04, 2004.   REASON FOR ADMISSION:  Please refer to the dictated admission note.   LABORATORY DATA:  Normal CBC, metabolic profile.  On admission, normal  cardiac markers.  Lipid profile: Total cholesterol 101, triglycerides 69,  HDL 33, LDL 54.  TSH 1.01.  Mildly-elevated ALT of 51.   Admission chest x-ray:  NAD.   HOSPITAL COURSE:  The patient was admitted for further evaluation of  symptoms worrisome for unstable angina pectoris in the setting of known  coronary artery disease.   Serial cardiac markers were negative with all serial troponins within normal  limits.   The patient was maintained on intravenous heparin with plans to proceed with  cardiac catheterization the following day.   Cardiac catheterization, performed by Dr. Shawnie Pons (see report for  full details), revealed widely-patent proximal LAD stent, 80% proximal RCA  lesion.  This was successfully treated with a Cypher stent with no noted  complications.   The patient was kept for overnight observation and cleared for discharge the  following morning with a hemodynamically stable condition.   Dr. Riley Kill discussed the option of proceeding with a stress Cardiolite  versus PCI of the left anterior descending, with him favoring the latter  option.  He will defer a decision until the time of his scheduled office  followup.   DISCHARGE MEDICATIONS:  1.  Plavix 75 mg daily.  2.  Coated aspirin 325 mg daily.  3.  Lipitor 40 mg daily.  4.  Lopressor 12.5 mg b.i.d.  5.  Diovan 80 mg daily.  6.   Nitrostat 0.4 mg as directed.   DISCHARGE INSTRUCTIONS:  Follow up with Dr. Shawnie Pons the following  Monday at 2 p.m.   DISCHARGE DIAGNOSES:  1.  Unstable angina pectoris.      1.  Normal troponin markers.      2.  Status post Cypher stent proximal right coronary artery 02/01/05.      3.  Widely-patent left anterior descending artery stent.      4.  Status post acute ST elevation myocardial infarction/stent left          anterior descending artery in 7/03.  2.  History of sinus bradycardia.  3.  Dyslipidemia.  4.  Hypertension.      GS/MEDQ  D:  02/05/2005  T:  02/05/2005  Job:  [id number]

## 2011-04-05 NOTE — Cardiovascular Report (Signed)
NAME:  ECTOR, LAUREL                ACCOUNT NO.:  [id number]   MEDICAL RECORD NO.:  [id number]          PATIENT TYPE:  INP   LOCATION:  4705                         FACILITY:  [facility]   PHYSICIAN:  Arturo Morton. Riley Kill, M.D. [facility] OF BIRTH:  1956/04/08   DATE OF PROCEDURE:  12/04/2004  DATE OF DISCHARGE:                              CARDIAC CATHETERIZATION   INDICATIONS:  Mr. Rebello is a very nice 55 year old gentleman well known to  me.  He presented with prior anterior wall myocardial infarction treated  with stenting in the [facility] trial with distal protection.  He subsequently  has done well, but in the last few days developed recurrent symptoms  suggestive of recurrent ischemia.  Based on this, repeat catheterization was  recommended.  Initial enzymes were negative.  There were no definite  electrocardiographic abnormalities.   PROCEDURE:  1.  Left heart catheterization  2.  Selective coronary arteriography.  3.  Selective left ventriculography.  4.  Subclavian angiography.  5.  Percutaneous stenting of the proximal right coronary artery.   DESCRIPTION OF PROCEDURE:  The patient was brought to the catheterization  laboratory and prepped and draped in the usual fashion.  Through an anterior  puncture, the right femoral artery was entered; 6-French sheath was placed.  Views of the left and right coronaries were then obtained with multiple  angiographic projections.  Central aortic and left ventricular pressures  were measured with a pigtail.  Ventriculography was then performed in the  RAO projection.  We also performed a subclavian angiogram because of the  multivessel disease.  I then reviewed the films with Dr. Loraine Leriche Pulsipher.  Our immediate concern was the proximal right coronary artery, but I was also  concerned about the mid left anterior descending artery.  The previous stent  site was widely patent.  Percutaneous stenting of the right coronary artery  proximally was  recommended.  Decision on the left anterior descending artery  was not made, either with regard to intervention and/or timing of this.  It  was felt that the right was likely the source of ischemia, although we were  not entirely sure.  I discussed this with the patient, subsequently with his  mother and wife.  After a thorough discussion with the patient, I elected to  proceed with percutaneous stenting.  A JR-4 guiding catheter with side holes  was utilized.  Bivalirudin was given according to protocol.  The ACT was  checked and was found to be in excess of 200 seconds.  The actual ACT was  375 seconds.  Using a high-torque floppy wire, we crossed the lesion.  Because of external calcification, I elected to initially dilate with a 2.25  cutting balloon.  This was taken to 8 atmospheres.  Subsequent dilatations  were then done with both the 25 and 275 Quantum Maverick balloons.  There is  no dumbbell associated with balloon inflation, so we went ahead and deployed  a 3-0 x 18 Cypher drug-eluting stent.  This was taken up to 15 atmospheres.  Postdilatations were done with a 3.25 Quantum Maverick balloon throughout  the course of the stent.  There was really dramatic improvement in the  appearance of the artery.  There was slight oozing in the groin, so the 6-  [place] sheath was exchanged for 7-French sheath.  He was taken to the  holding area in satisfactory clinical condition without complication.   HEMODYNAMIC DATA:  1.  Central aortic pressure 122/77, mean 98.  2.  Left ventricular pressure 125/6  3.  No gradient on pullback across the aortic valve.   ANGIOGRAPHIC DATA:  1.  Ventriculography was performed in the RAO projection.  Estimated      ejection fraction would be in the range of about 50-55%.  There is an      apical area of slight akinesis to dyskinesis.  2.  The left main is free of critical disease.  3.  The LAD has about 40% proximal narrowing and is diffusely diseased       throughout the proximal vessel.  There is appearance of about 70-80%      narrowing just prior to the takeoff of the diagonal.  The diagonal      itself has a mild degree of ostial narrowing.  Just distal to this site      is the site of prior total occlusion and previous placement of      overlapping 2.25 Express stents.  There is absolutely no evidence of      significant restenosis, and the distal vessel wraps the apex and      provides the distal inferior segment.  4.  The circumflex is a fairly large vessel.  There is a first marginal      branch with 30-50% proximal irregularities and about 40% eccentric      plaquing in the mid vessel overlapping a tiny marginal branch.  The next      large marginal branch has about 30% narrowing, and there is an eccentric      40% narrowing in the distal marginal branch as well.  5.  The right coronary artery is a somewhat diffusely diseased vessel.      There is calcification proximally associated with an 80% stenosis that      is hazy and slightly underfilled.  Distal to this is some luminal      irregularity and then about a 30-40% area of narrowing in the proximal      portion of the distal vessel.  There is mild luminal irregularity at the      bifurcation distally for the PDA and continuation branch.  However, none      of this appears to be high grade.  Following the stent implantation of a      3-0 Cypher stent which was postdilated to 3.25 using a Quantum Maverick      balloon, there was a reduction in the stenosis from 80 to 0%.  There was      no evidence of edge tear or edge dissection.  There was a smooth lumen      with what appears to be a slight step-up and step-down in an appropriate      angiographic fashion.  There was TIMI III flow to the distal vessel.   CONCLUSIONS:  1.  Preserved left ventricular function with continued wall motion      abnormality involving the apex. 2.  Continued patency of the previous stent sites at  the site of acute      myocardial infarction in the mid LAD with  previous non-drug-eluting      stents.  3.  New implantation of a Cypher drug-eluting stent in the proximal right      coronary artery at the site of progression of disease.  4.  Progression of disease in the left anterior descending artery and just      prior to the bifurcation of the LAD diagonal and proximal to the      previously placed stents.  5.  Continued scattered irregularities of the circumflex, as described      above.   DISPOSITION:  The patient has had satisfactory placement of the Cypher  stent.  We used approximately 275-300 mL of contrast.  The LAD potentially  could be a complex intervention, and we have discussed the possibility of  either exercise testing or percutaneous intervention, although I am leaning  towards this because the MLD be appears to be less than two.  We will check  on the patient tomorrow and make a final decision with regard to timing of  the next intervention and options.  This will be discussed with the patient  in detail.       TDS/MEDQ  D:  12/04/2004  T:  12/04/2004  Job:  [id number]

## 2011-04-05 NOTE — Discharge Summary (Signed)
NAME:  Zachary [name], Zachary [name]                ACCOUNT NO.:  [id number]   MEDICAL RECORD NO.:  [id number]          PATIENT TYPE:  INP   LOCATION:  5502                         FACILITY:  [facility]   PHYSICIAN:  Arturo Morton. Riley Kill, M.D. [facility] OF BIRTH:  1956/02/22   DATE OF ADMISSION:  04/21/2005  DATE OF DISCHARGE:  04/24/2005                                 DISCHARGE SUMMARY   PROCEDURES:  1.  Cardiac catheterization.  2.  Coronary arteriogram.  3.  Left ventriculogram.  4.  GXT Myoview.   DISCHARGE DIAGNOSES:  1.  Chest pain, D-dimer negative for pulmonary embolus and cardiac      catheterization showing 650-70% in-stent restenosis with Cardiolite      negative for ischemia.  2.  Status post percutaneous transluminal coronary angiography and stent to      the right coronary artery and left anterior descending.  3.  Preserved left ventricular function with an ejection fraction of 55% and      mild anterior apical hyperkinesis.  4.  Dyslipidemia.  5.  Hypertension.  6.  Status post cholecystectomy.  7.  History of an allergy to clams, but not shrimp, gets prophylaxis prior      to contrast administration.  8.  Allergy to iodine.  9.  Status post tonsillectomy.  10. Status post myocardial infarction in 2003 treated with percutaneous      intervention.  11. Insomnia.   HOSPITAL COURSE:  Zachary [name] is 55 year old male with a known history of  coronary artery disease. He had an anterior MI in 2003. He had recurrent  chest pain and came to the ER where he was admitted for further evaluation  and treatment.   His cardiac enzymes were negative for MI. It was felt that cardiac  catheterization was indicated to further define his anatomy and this was  performed on April 22, 2005. The cardiac catheterization showed 60% in-stent  restenosis to the proximal LAD stent. He had 30% stenosis in a small second  diagonal. OM3 and OM4 had 30% stenosis. The RCA stent was widely patent with  a 25% stenosis  distal to that. His ejection fraction was 55%.   Dr. Riley Kill evaluated the films and felt that a stress Myoview was indicated  to assess the LAD lesion. This was performed on April 24, 2005. The Myoview  showed no ischemia. There was a question of decreased apical wall motion,  but his EF was 50%. There was no clear scar. The results were communicated  with Dr. Riley Kill and he felt that Zachary [name] could be discharged with  outpatient followup. Additionally, a D-dimer was checked and was within  normal limits.   As part of his evaluation, a lipid profile was performed and showed a total  cholesterol of 122, triglycerides 103, HDL 34 and LDL 67. He is to continue  on Lipitor 40 mg. He was encouraged to start a regular exercise program.  Additionally, his heart rate was noted to drop into the 30s overnight. He  was asymptomatic with this. He was generally bradycardic with a heart rate  in the  mid 55s after the metoprolol was held. The metoprolol is on hold and  Dr. Riley Kill is to evaluate him and decide on restarting it.   Zachary [name] was considered stable for discharge on April 24, 2005, with  outpatient followup arranged.   DISCHARGE INSTRUCTIONS:  1.  His activity level is to include no strenuous activity for two days.  2.  He is to stick to a low-fat diet.  3.  He is to call the office for problems with the catheterization site.  4.  He is to follow up with Dr. Alwyn Ren as needed and to follow up with Dr.      Riley Kill in June 05, 2005, at 11:45 a.m.   DISCHARGE MEDICATIONS:  1.  Diovan 80 mg daily.  2.  Plavix 75 mg daily.  3.  Lipitor 40 mg daily.  4.  Metoprolol is on hold.  5.  Nitroglycerin sublingual p.r.n.  6.  Clonazepam p.r.n.  7.  Coated aspirin 325 mg daily.  8.  Protonix 40 daily.       RB/MEDQ  D:  04/24/2005  T:  04/24/2005  Job:  [id number]   cc:   Titus Dubin. Alwyn Ren, M.D. [facility]

## 2011-04-05 NOTE — Assessment & Plan Note (Signed)
[facility]                         GASTROENTEROLOGY OFFICE NOTE   NAME:BURNSDre, Gamino                         MRN:          [id number]  DATE:02/23/2007                            DOB:          02-Oct-1956    REASON FOR REFERRAL:  Hematochezia and colorectal cancer screening.   HISTORY OF PRESENT ILLNESS:  Zachary [name] is a 55 year old white male  referred through the courtesy of Dr. Bonnee Quin.  He relates  intermittent small volume hematochezia and perianal itching that he has  attributed to hemorrhoids.  These symptoms have bothered him  intermittently for years and have not recently worsened.  He has  occasional intestinal gas as well.  He notes no weight loss, nausea,  vomiting, dysphagia, odynophagia, heartburn, melena, change in bowel  habits, constipation, diarrhea, or change in stool caliber.  There is no  family history of colon cancer, colon polyps, or inflammatory bowel  disease.  Mr. Kimball has underlying coronary artery disease and he is  status post 4 stent placements, 2 of which are drug eluting stents.  The  last stent was placed in January of 2006.  He is maintained on aspirin  and Plavix.   FAMILY HISTORY:  Negative for colon cancer, colon polyps, and  inflammatory bowel disease.   PAST MEDICAL HISTORY:  1. Coronary artery disease, status post angioplasty and stent      placement times four.  Status post myocardial infarction 2003.  2. Hyperlipidemia.  3. Hypertension.  4. Status post tonsillectomy and adenoidectomy.  5. Status post cholecystectomy 2005.   CURRENT MEDICATIONS:  Listed on the chart, updated and reviewed.   MEDICATION ALLERGIES:  IODINE.   SOCIAL HISTORY:  Per the handwritten form.   REVIEW OF SYSTEMS:  Per the handwritten form.   PHYSICAL EXAMINATION:  Overweight white male, in no acute distress.  Height 5 feet 4 inches, weighs 199 pounds, blood pressure 110/70, pulse  60 and regular.  HEENT:  Anicteric  sclera, oropharynx clear.  CHEST:  Clear to auscultation bilaterally.  CARDIAC:  Regular rate and rhythm without murmurs appreciated.  ABDOMEN:  Soft, nontender, nondistended, normoactive bowel sounds, no  palpable organomegaly, masses or hernias.  RECTAL:  Deferred to time of colonoscopy.  EXTREMITIES:  Without clubbing, cyanosis, or edema.  NEUROLOGIC:  Alert and oriented x3, grossly nonfocal.   ASSESSMENT/PLAN:  Small volume hematochezia and perianal itching.  Suspect hemorrhoidal symptoms.  Risks, benefits, and alternatives to  colonoscopy with possible biopsy, possible injection of hemorrhoids and  possible polypectomy discussed with the patient, he consents to proceed.  This will be scheduled electively.  We will request that Plavix be held  for 7 days prior to the procedure.  He can maintain aspirin 81 mg daily  through the procedure.  We will obtain clearance for this management  strategy from Dr. Bonnee Quin.     Venita Lick. Russella Dar, MD, [facility]  Electronically Signed    MTS/MedQ  DD: 02/23/2007  DT: 02/23/2007  Job #: [id number]   cc:   Arturo Morton. Riley Kill, MD, [facility]

## 2011-04-05 NOTE — Cardiovascular Report (Signed)
NAME:  Zachary [name], SALTZMAN                ACCOUNT NO.:  [id number]   MEDICAL RECORD NO.:  [id number]          PATIENT TYPE:  OIB   LOCATION:  6522                         FACILITY:  [facility]   PHYSICIAN:  Arturo Morton. Riley Kill, M.D. [facility] OF BIRTH:  16-Jul-1956   DATE OF PROCEDURE:  12/13/2004  DATE OF DISCHARGE:                              CARDIAC CATHETERIZATION   INDICATIONS:  Mr. Sessums is a delightful 55 year old gentleman well-known to  me. He previously presented with an anterior wall infarction and was treated  with primary percutaneous coronary intervention. He has done well, but  recently had recurrence of symptoms. He underwent repeat catheterization  demonstrating progression of disease in the proximal right coronary as well  as some progression of disease proximal to the previously placed stent. The  RCA was subsequently dilated and stented, and he was brought back today for  treatment of the LAD. He and I discussed the various options including  continued medical therapy with Cardiolite imaging, or dilatation of the left  anterior descending artery at this location. After a thoughtful and careful  consideration, the patient wanted to proceed with an attempt at percutaneous  intervention. Risks and alternatives were discussed with the patient in  detail and he consented to proceed.   PROCEDURE:  Percutaneous stenting of the left anterior descending artery.   DESCRIPTION OF PROCEDURE:  Following informed consent, the patient was  brought to the catheterization laboratory and prepped and draped in usual  fashion. Through the anterior puncture, the left femoral artery was entered.  A 6-French sheath was placed. A right coronary catheter was then utilized to  demonstrate continued patency of the RCA. Following this, this catheter was  removed. A JL-3.5 guide was then placed. The patient had been given by  bivalirudin. Predilatation was carried out with 2.25-mm balloon across the  area  of disease. This appeared improved after the initial dilatation.  Following this, we attempted to place a stent. We were unable get the stent  past the mid-vessel just above the area where the most severe portion of the  lesion was located. As a result, the lesion then was subsequently dilated  again, this time was 2.5-mm balloon. Using double-wire technique, we were  eventually able to get the stent down. The stent was then taken up to 14  atmospheres. Following this, a 2.5-mm Quantum Maverick balloon was taken to  the site and multiple dilatations done up to 16 atmospheres. There was  marked improvement the appearance of the artery. Following this, a 2.5  Maverick balloon was taken down distally into the old stent, where not  dilatation was subsequently performed at an area of slight haziness. There  is substantial improvement in the appearance of the overall artery without  loss of the side branch. There was some mild dimpling of the ostium of the  diagonal, but there is excellent grade 3 flow and we elected not to dilate  through the stent.   All catheters were subsequently removed and the femoral sheath was sewn into  place. Incidentally, the patient was pretreated for possible contrast-  related  allergy with prednisone prior to the procedure and Pepcid. There  were no complications and the femoral sheath was sewn into place. He was to  go to 6500 for convalescence. .   ANGIOGRAPHIC DATA:  The right coronary is large-caliber vessel. At the  previous site of stenting proximally, the vessel was widely patent. The  right coronary was virtually unchanged from the previous study. The left  anterior descending artery demonstrates about 30% to 40% area of proximal  narrowing. The vessel itself was diffusely diseased and heavily calcified.  At the origin of septal perforator is about a 20% to 30% area of narrowing  followed by about a 70% to 80% area of narrowing. This is best seen in the   RAO cranial view. After deployment of a stent, the area of narrowing was  reduced to 0% residual luminal narrowing at the site of narrowing. There is  some mild dimpling of the diagonal as previously noted. Overlapping the area  of the septal perforator, there remained a small indentation, probably from  calcified disease, at about 2-1/2 mm which did not fully dilate; however,  there appeared to be very adequate lumen. The distal LAD was unchanged from  the diagnostic study.   CONCLUSION:  Successful percutaneous stenting of the left anterior  descending artery without complication.   DISPOSITION:  The patient will be treated medically. He has a diffusely  diseased LAD, as previously noted. Despite this, there was improvement in  the appearance of the artery and the right remains widely patent; aggressive  medical therapy will be recommended.      TDS/MEDQ  D:  12/13/2004  T:  12/14/2004  Job:  [id number]   cc:   Titus Dubin. Alwyn Ren, M.D. [facility]   [place] [facility]

## 2011-04-05 NOTE — Discharge Summary (Signed)
. [facility] [place]  Patient:    Zachary [name], Zachary [name] Visit Number: [id number] MRN: [id number]          Service Type: MED Location: 2000 2014 01 Attending Physician:  Nathen May Dictated by:   Brita Romp, P.A. Admit Date:  06/03/2002 Discharge Date: 06/07/2002   CC:         Arturo Morton. Riley Kill, M.D. [facility]   Discharge Summary  DISCHARGE DIAGNOSES: 1. Acute anterior myocardial infarction. 2. Coronary artery disease. 3. Hyperlipidemia. 4. Hypertension.  HOSPITAL COURSE:  The patient is a 55 year old male with no prior coronary artery disease history. He presented to the emergency room on June 03, 2002, complaining of acute onset of chest pain. He was seen and admitted by Dr. Sherryl Manges. Dr. Graciela Husbands noted the patient had 3 to 4 mm ST segment elevation across the anterior precordium, indicating acute anterior MI.  The patient was taken urgently to the catheterization laboratory by Dr. Bonnee Quin. Catheterization revealed 100% occlusion of the left anterior descending coronary artery in the midportion. Dr. Riley Kill was able to cross this lesion using a balloon dilator, restoring flow. He then placed two Express 2 stents. The second stent also covered a 70% lesion which was slightly distal to the total occlusion. Of note there was a 90% lesion in the very distal tip of the LAD. Also of note there were scattered 40% to 70% lesions in portions of the LAD, as well as in the circumflex system and the right coronary artery. However, none of these required intervention at that time.  The next day the patient was again seen by Dr. Riley Kill. The patient was doing well and had no further chest pain or shortness of breath. However, he did feel palpitations. Telemetry showed an accelerated ventricular rhythm competing with normal sinus rhythm and short bursts of ventricular tachycardia. Dr. Riley Kill started intravenous lidocaine for the  ventricular tachycardia.  On June 04, 2002, the patient was doing well, had no further chest pain. He also had had no further ventricular ectopy and the lidocaine was discontinued.  On June 05, 2002, the patient was doing well. He continued to remain asymptomatic. A 2D echocardiogram the previous day showed para-apical wall akinesis as well as a left atrial enlargement. The ejection fraction was approximately 30%. He was also started on Altace for his left ventricular dysfunction.  On June 07, 2002, the patient was seen by Dr. Jens Som. The patient reported headache and "feeling bad" after the Altace. Dr. Jens Som discontinued the Altace and started the patient on Diovan. Otherwise, he felt the patient was stable for discharge.  DISCHARGE MEDICATIONS: 1. Enteric coated aspirin 325 mg q.d. 2. Plavix 75 mg q.d. until gone. 3. Toprol XL 100 mg q.d. 4. Diovan 80 mg q.d. 5. Pravachol 40 mg q.p.m. 6. Sublingual nitroglycerin p.r.n.  LABORATORY DATA: Total cholesterol 268, triglyceride 68, HDL 38, LDL 216, total cholesterol HDL ratio 7.1. Total protein 6.4, albumin 3.2. AST 33, ALT 44, alkaline phosphatase 64, total bilirubin 1.3, direct bilirubin 0.2, indirect bilirubin 1.1. White count 17.1, hemoglobin 16.8, hematocrit 47.9, platelets 247.  A chest x-ray on admission showed the cardiac silhouette to be the upper limits of normal size. There was some pulmonary vascular congestion without overt edema and minimal left basilar atelectasis. An electrocardiogram showed a sinus rhythm of 64, PR interval 148, QRS 86, QTC 425, axis -6, lateral T wave abnormalities. These were all signs of an anterior infarction.  DISCHARGE INSTRUCTIONS:  The patient is to  remain off work until seen in the office. He is to avoid strenuous activity. He is to follow a low fat, low cholesterol diet. He is to watch the catheter site for any pain, bleeding or swelling and to call the [place] office with any of  these problems.  FOLLOWUP:  He is to follow up with the P.A. visit in the office on June 21, 2002, at noon. He is to follow up with Dr. Riley Kill in the office on July 22, 2002, at 3 p.m. Dictated by:   Brita Romp, P.A. Attending Physician:  Nathen May DD:  06/07/02 TD:  06/11/02 Job: [id number]

## 2011-04-05 NOTE — Assessment & Plan Note (Signed)
[facility]                              CARDIOLOGY OFFICE NOTE   BRENTEN, [name]                         MRN:          [id number]  DATE:07/24/2006                            DOB:          [birth year]    Mr. Zachary [name] is in for a follow-up visit.  In general, he is doing extremely  well.  He has some very mild occasional chest discomfort, but it is not  exertion-provoked.  It occurs spontaneously and is not really radiating.  He  says this is no different than he has had all along.  The patient last  underwent cardiac catheterization in June 2006.  At that time he had about a  60-70 re-narrowing in the LAD stent in the area of the prior infarct.  We  did a follow-up exercise test in February, and he did not have exercise-  provoked symptoms.  He has continued to get along well and has not been  riding his bike recently but overall has tolerated things nicely.   His medications include:  1. Plavix 75 mg daily.  2. Vytorin 10/20 mg q.h.s.  3. Aspirin 81 mg daily.  4. Diovan 80 mg daily.   PHYSICAL EXAMINATION:  VITAL SIGNS:  Blood pressure 122/82, pulse is 54.  CHEST:  The lung fields are clear.  CARDIAC:  Cardiac rhythm is regular.  ABDOMEN:  Soft.   EKG reveals evidence of an anteroseptal wall myocardial infarction.   The patient does not appear to have provokable ischemia.  I am not quite  sure as to the etiology of his chest discomfort but for the present time  would continue to recommend medical therapy.  He had an exercise test back  earlier this year.  I have suggested that he see Zachary [name]. Zachary Ren, MD, in  follow-up.  He probably is due for an endoscopy at this point in time and a  follow-up physical examination.  We will see him back in 6 months.                              Zachary [name]. Riley Kill, MD, [facility]    TDS/MedQ  DD:  07/24/2006  DT:  07/24/2006  Job #:  [id number]

## 2011-04-05 NOTE — Letter (Signed)
December 24, 2006    Arturo Morton. Riley Kill, MD, [facility]  [address]  [address]  [place], [place] [address]   RE:  [name], Zachary [name]  MRN:  [id number]  /  DOB:  1956/04/09   Dear Elijah Birk:   I saw Zachary [name] on February the 6th for bronchitis; at that time we  discussed his lack of surveillance in reference to a colonoscopy. Such  is recommended at age 55 and every 5-10 years thereafter.  He has no  active GI symptoms, family history is negative.   He stated that he discussed this with you, and you stated there was no  contraindication despite his history of stenting due to MI in 2003, and  restenting in  2006.   I shall arrange for a GI consult in view of this history; will he need  any other preop clearance from your standpoint?  When seen, his blood  pressure was 110/76, and cardiopulmonary exam was unremarkable.  He  has  no anginal symptoms.  I believe you have been monitoring his lipids; if  this is not the case, please advise me.    Sincerely,      Titus Dubin. Alwyn Ren, MD,FACP,FCCP  Electronically Signed    [facility]/MedQ  DD: 12/26/2006  DT: 12/26/2006  Job #: [id number]

## 2011-04-05 NOTE — Cardiovascular Report (Signed)
[place]  Patient:    [name], Zachary Visit Number: [id number] MRN: [id number]          Service Type: MED Location: 2000 2014 01 Attending Physician:  Nathen May Dictated by:   Arturo Morton Riley Kill, M.D. [facility] Proc. Date: 06/03/02 Admit Date:  06/03/2002   CC:         Bruce R. Juanda Chance, M.D. [facility]  Cardiac Catheterization Lab  Nathen May, M.D., [facility]   Cardiac Catheterization  INDICATIONS:  The patient is a 55 year old gentleman who presented to the emergency room with sudden onset of substernal chest pain.  The electrocardiogram was diagnostic for anterior ST elevation consistent with anterior wall MI.  He was brought to the catheterization lab emergently for reperfusion therapy.  In the catheterization lab, he was consented to the [facility] trial for possible distal protection.  PROCEDURES: 1. Left heart catheterization. 2. Selective coronary arteriography. 3. Selective left ventriculography. 4. Percutaneous transluminal coronary angioplasty and stenting of the left    anterior descending artery using the Emerald protocol.  DESCRIPTION OF PROCEDURE:  The patient was brought to the catheterization lab and prepped and draped in the usual fashion.  Through an anterior puncture, the right femoral artery was entered.  A #7 French sheath was placed.  Views of the left and right coronary arteries were obtained in multiple angiographic projections.  Ventriculography was performed in the RAO projection using 40 cc of contrast at 13 cc per second.  There were no complications with this. During the course of this, the patient was also given intravenous metoprolol in sequential doses, 5 mg.  The patient was noted to have a total occlusion of the mid left anterior descending artery.  There was rather significant segmental disease proximally.  Heparin and Integrilin were given according to protocol.  Using a JL3.5 guiding catheter and  a Guardwire, the Guardwire was carefully manipulated down the left anterior descending artery.  Reperfusion had been achieved just prior to this.  The Guardwire was taken down across the lesion and placed in the distal vessel.  With this there was evidence of TIMI-3 flow.  The vessel appeared to be approximately 2.5 mm and preparations were then made for initially an angioplasty with possibly a stent.  The Guardwire was then inflated distally and the balloon taken down to the site and 2 inflations performed with a 2.5 x 20-mm length CrossSail balloon.  The balloon was then removed and then we passed the Export catheter into the LAD. We were unable to pass the Export catheter down to the distal protection balloon.  Several passes were made to try to pass the Export catheter.  We were unable to do so.  This was then subsequently removed and the distal balloon taken down.  The vessel was then stented using a 2.25 Express 2 stent (2.25 x 24-mm) in the mid vessel.  This did not quite completely cover the lesion, and a second stent had to be placed proximally, an 2.25 x 8-mm Express 2 stent.  These areas were then post dilated using the original balloon up to about 16 atmospheres distally.  We elected not to dilate the more proximal segmental disease because of its overlap with a large septal perforator which had about 70% ostial disease and some overlap with a large diagonal branch.  There was also disease involving the very proximal portion of the LAD, and it was felt that the mid disease was likely the reason that the  Export catheter would not pass down the distal vessel.  The patient headache coughing and shortness of breath and with an elevated LVEDP was given Lasix with excellent diuresis.  He was subsequently taken to the CCU after removal of all catheters and sewing of the femoral sheath into place.  There was substantial relief of pain with reperfusion.  I then reviewed the films with  the patients wife and mother and subsequently with the patient.  HEMODYNAMIC DATA: 1. The central aortic pressure was 175/116. 2. Left ventricular pressure was 179/41. 3. There was no gradient on pullback across the aortic valve.  ANGIOGRAPHIC DATA: 1. On plain fluoroscopy, there was evidence of calcification in the LAD.  2. The left main coronary artery was free of critical disease.  3. The LAD has about 40-50% proximal focal stenosis followed by a segmental    area of 50% narrowing in the mid vessel overlying the septal perforator.    The septal perforator itself has about 70% narrowing.  The mean lumen    diameter is just about 2 mm at this location.  The diagonal branch comes    off at this location and has about 40% narrowing.  Just distal to this    the vessel was totally occluded.  Following initial reperfusion at the    wire, there is a subtotal occlusion with about a 70% stenosis just distal    to this about 20 mm.  Following stenting in both areas, these areas were    reduced to 0% residual.  There was, what appeared to be, 80-90% disease    in the very apical tip portion of the LAD.  At the beginning of the    procedure, there was TIMI-0 flow and at the completion of the procedure,    there was TIMI-3 flow.  4. The circumflex provides a fairly large marginal system.  There is a first    marginal branch with some segmental 50% plaquing.  In the mid circumflex,    there is about 50% eccentric stenosis.  Distally, there is about 30-40%    narrowing.  5. The right coronary artery demonstrates 50-70% narrowing segmentally near    the junction of the proximal and mid vessel.  There is about 30%    narrowing just prior to the PDA.  LEFT VENTRICULOGRAPHY:  Ventriculography in the RAO projection reveals hypo to akinesis of the mid and distal anterolateral wall, apex, and distal inferior wall.  Ejection fraction would be estimated in the range of about 40%.  Ejection fraction  was calculated at 57% in the RAO view.  The patient had the onset of chest pain at 10:00 p.m.  His arrival in the emergency room was at 1:25 a.m. and subsequently he was seen by Dr. Graciela Husbands.  He arrived in the catheterization lab at 2:28 a.m. and TIMI-3 flow was achieved t 3:10 a.m.  Door-to-balloon time was 105 minutes with a reperfusion time of 5 minutes and 10 seconds.  CONCLUSIONS: 1. Large anterior wall myocardial infarction treated with primary percutaneous    coronary intervention using Emerald trial protocol with a Guardwire. 2. Inability to pass the Export catheter beyond the mid vessel. 3. Successful stenting of the mid left anterior descending artery as described    in the above text. 4. 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  DISPOSITION:  The patient has a fair amount of proximal disease.  It is not  ideal for percutaneous intervention as it overlies a large septal perforator. We elected at this situation to not dilate this and there was excellent timi-3 flow at the completion of the procedure.  Options will need to be considered. Dictated by:   Arturo Morton Riley Kill, M.D. [facility] Attending Physician:  Nathen May DD:  06/03/02 TD:  06/07/02 Job: [id number]

## 2011-04-05 NOTE — Op Note (Signed)
NAME:  Zachary [name], Zachary [name]                            ACCOUNT NO.:  [id number]   MEDICAL RECORD NO.:  [id number]                   PATIENT TYPE:  AMB   LOCATION:  DAY                                  FACILITY:  [facility]   PHYSICIAN:  Angelia Mould. Derrell Lolling, M.D.             DATE OF BIRTH:  February 05, 1956   DATE OF PROCEDURE:  03/16/2004  DATE OF DISCHARGE:                                 OPERATIVE REPORT   PREOPERATIVE DIAGNOSIS:  Chronic cholecystitis with cholelithiasis.   POSTOPERATIVE DIAGNOSIS:  Chronic cholecystitis with cholelithiasis.   OPERATION PERFORMED:  Laparoscopic cholecystectomy with intraoperative  cholangiogram.   SURGEON:  Angelia Mould. Derrell Lolling, M.D.   ASSISTANT:  Leonie Man, M.D.   OPERATIVE INDICATIONS:  This is a 55 year old white male with a history of  coronary artery disease, myocardial infarction, and coronary artery stent  placement two years ago.  He has done well.  He has had some intermittent  episodes of right upper quadrant pain over the past two years.  He had a  severe attack after Thanksgiving.  He has had an ultrasound which shows  multiple large gallstones.  His liver function tests are normal.  He is  brought to the operating room electively.   OPERATIVE FINDINGS:  The gallbladder was chronically inflamed and somewhat  thick-walled with lots of adhesions to the gallbladder.  The liver looked  fine.  The stomach, duodenum, large intestine and small intestine were  grossly normal to inspection.  The anatomy of the cystic duct showed that  the cystic duct was quite long and inserted very low into the common bile  duct but otherwise the intrahepatic and extrahepatic biliary anatomy was  normal.  There was no filling defect, and there was prompt flow of contrast  into the duodenum on the cholangiogram.   OPERATIVE TECHNIQUE:  Following the induction of general endotracheal  anesthesia, the patient's abdomen was prepped and draped in a sterile  fashion.  Marcaine  0.5% with epinephrine was used as a local infiltration  anesthetic.  A vertically oriented incision was made at the superior rim of  the umbilicus.  The fascia was incised in the midline, and the abdominal  cavity entered under direct vision.  A 10 mm Hasson trocar was inserted and  secured with a purse-string suture of 0 Vicryl.  Pneumoperitoneum was  created.  The cannula was inserted with visualization and findings as  described above.  A 10 mm trocar was placed in the subxiphoid region, and  two 5 mm trocars placed in the right mid abdomen.  We identified the fundus  of the gallbladder and elevated it.  We found that the omentum was quite  bulky, so we used a 30 degree scope, and that was very helpful.  We took the  adhesions down off the body and infundibulum of the gallbladder, and  ultimately, we were able to dissect out the cystic duct  and the cystic  artery.  We isolated the cystic duct, as it went onto the wall of the  gallbladder.  We isolated the cystic artery, as it went onto the wall of the  gallbladder.  We secured the cystic artery with multiple metal clips and  divided  it.  A clip was placed on the cystic duct, as it inserted into the  gallbladder.  A cholangiogram catheter was inserted into the cystic duct,  and a cholangiogram was obtained using a C-arm.  The cholangiogram showed  good flow of contrast into the duodenum, no obstruction, no filling defects,  and normal anatomy except for a very long cystic duct which inserted quite  low onto the common bile duct.  The cholangiogram catheter was removed.  The  cystic duct was secured with multiple metal clips and divided.  The  gallbladder was dissected from its bed with electrocautery and removed  through the umbilical port.  The operative field was copiously irrigated.  At the completion of the case, there was absolutely no bleeding and no bile  leak whatsoever.  Hemostasis was excellent.  The trocars were removed under   direct vision, and there was no bleeding from the trocar sites.  Pneumoperitoneum was released.  The fascia at the umbilicus was closed with  a 0 Vicryl suture.  The skin incisions were closed with subcuticular  stitches with 4-0 Vicryl and Steri-Strips.  Clean bandages were placed.  The  patient was taken to the recovery room in stable condition.  Estimated blood  loss was about 10 cc.  Complications were none.  Sponge, instrument, and  needle counts were correct.                                               Angelia Mould. Derrell Lolling, M.D.    HMI/MEDQ  D:  03/16/2004  T:  03/16/2004  Job:  [id number]   cc:   Titus Dubin. Alwyn Ren, M.D. [facility]   Wanda Plump, MD [facility]  [phone] [place], [place] [address]   Arturo Morton. Riley Kill, M.D. [facility]

## 2011-04-05 NOTE — Consult Note (Signed)
NAME:  Zachary [name], Zachary [name]                            ACCOUNT NO.:  [id number]   MEDICAL RECORD NO.:  [id number]                   PATIENT TYPE:  INP   LOCATION:  4713                                 FACILITY:  [facility]   PHYSICIAN:  Angelia Mould. Derrell Lolling, M.D.             DATE OF BIRTH:  02-23-1956   DATE OF CONSULTATION:  07/28/2002  DATE OF DISCHARGE:                                   CONSULTATION   REASON FOR CONSULTATION:  Evaluate gallstones.   HISTORY OF PRESENT ILLNESS:  This is a 55 year old white man who suffered an  acute myocardial infarction in July 2003, underwent catheterization with two  stents placed, has actually done reasonably well since that time.  He was  apparently seen in the emergency room last night following the onset of  right flank pain at 1 o'clock p.m. yesterday.  The pain gradually worsened.  He was perhaps a little bit anorexic but denies nausea, vomiting, fever,  diarrhea, or prior episodes.  He has no history of prior GI disease.   He was evaluated in the emergency room last night, and reportedly an  ultrasound shows gallstones.  I do not have these films to review yet.  Also  his lab work including CBC, liver function tests, amylase, lipase, and  urinalysis were all normal.  He was sent home with a prescription for pain  medication and a referral for general surgical consultation.  He did go to  sleep at workup, and he still had the right flank pain.  He had a syncopal  episode and came back to the emergency room.  He denies chest pain or  shortness of breath.   He is being admitted by cardiology for telemetry and observation.  I am  being asked to offer an opinion about his gallstones.   PAST MEDICAL HISTORY:  1. Myocardial infarction.  2. PTCA with stent x 2 in July 2003.  3. Tonsillectomy.   CURRENT MEDICATIONS:  1. Enteric-coated aspirin daily.  2. Toprol XL 50 mg a day.  3. Diovan 80 mg a day.  4. Pravachol 40 mg a day.  5. Clonazepam 0.5 mg a  day.  6. Plavix 75 mg a day.   ALLERGIES:  SHELLFISH, possible allergy to IODINE.   SOCIAL HISTORY:  The patient is married and lives in [place].  He is a  Training and development officer.  He denies tobacco or alcohol use.   FAMILY HISTORY:  Noncontributory.   PHYSICAL EXAMINATION:  GENERAL:  Pleasant, middle-aged man, somewhat anxious  but in no acute distress.  VITAL SIGNS:  Temperature pending.  Blood pressure 119/66, heart rate 50,  respiratory rate 16.  HEENT:  Sclerae clear.  Extraocular movements intact.  NECK:  Supple, nontender, no mass.  CHEST:  Lungs clear to auscultation.  No CVA tenderness.  HEART:  Regular rate and rhythm with no murmur.  ABDOMEN:  Soft with hypoactive  bowel sounds and not distended.  He does have  some localized tenderness in the right flank.  This is below the right  costal margin somewhere between the mid axillary line and the anterior  axillary line.  The right upper quadrant and epigastrium really are not  tender.  I feel no abdominal mass.   IMPRESSION:  1. Gallstones.  The patient clinical presentation and findings are     consistent with biliary colic, but they are atypical.  2. Coronary artery disease status post recent myocardial infarction on     aspirin and Plavix.  3. Syncopal episode of uncertain etiology, question vagal reaction.   PLAN:  1. I agree with admission for observation and telemetry.  2. We will proceed with hepatobiliary scan to see if the gallbladder is     obstructed.  3. He may need a cholecystectomy at some point, but this is not emergent.     We will hold his Plavix and aspirin now if okay with cardiology.  4. We will start him on empiric intravenous antibiotics such as Ancef     pending his workup.                                               Angelia Mould. Derrell Lolling, M.D.    HMI/MEDQ  D:  07/28/2002  T:  07/29/2002  Job:  [id number]   cc:   Titus Dubin. Alwyn Ren, M.D. [facility]   Madolyn Frieze. Jens Som, M.D. [facility]

## 2011-04-05 NOTE — H&P (Signed)
NAME:  Zachary [name], Zachary [name]                ACCOUNT NO.:  [id number]   MEDICAL RECORD NO.:  [id number]          PATIENT TYPE:  INP   LOCATION:  4705                         FACILITY:  [facility]   PHYSICIAN:  Arvilla Meres, M.D. [facility] OF BIRTH:  Aug 18, 1956   DATE OF ADMISSION:  DATE OF DISCHARGE:                                HISTORY & PHYSICAL   REASON FOR ADMISSION:  Mr. Meisenheimer is a 55 year old male with known coronary  artery disease status post acute anterior ST elevation myocardial infarction  in July 2003, treated with emergent stenting of the mid-left anterior  descending artery by Dr. Shawnie Pons.  Coronary anatomy was notable for  residual 50% proximal and 90% apical left anterior descending disease; 50%  first obtuse marginal; 50% circumflex artery; 50-70% right coronary artery  disease.  Ejection fraction was approximately 40%   Since then, he has had a non-ischemic exercise stress Cardiolite in August  2005, with calculated ejection fraction of 54%.   He had been doing quite well since his heart attack with no exertional chest  discomfort.  However, over the past 3-4 weeks, he has noted some mild  anterior chest discomfort which is unpredictable in onset and not correlated  with strenuous activity.  However, this was reminiscent of his MI presenting  symptoms.  Last night, however, he had his worst episode to date. This  occurred at approximately 10:00 while lying in bed.  Compared to his MI, he  rated this a 2/10 on a scale of 1/10 but stated that it was his worst  episode thus far.  It was localized over the precordium, but there was no  radiation to the jaw or upper extremities; there was only some mild  associated nausea but no vomiting. He did not take any nitroglycerin  and  the symptoms resolved after approximately 1-1/2 hours.  He has not had any  recurrent chest pain, but does report some residual tension in his chest.   The patient contacted our office and was  referred for direct admission.  EKG  on admission showed sinus bradycardia with poor R wave progression but no  acute changes.   ALLERGIES:  NO KNOWN DRUG ALLERGIES.  PATIENT REPORTS ALLERGY TO CLAMS, BUT  NO OTHER SHELLFISH INCLUDING SHRIMP.   MEDICATIONS PRIOR TO ADMISSION:  1.  Coated aspirin 325 daily.  2.  Diovan 80 daily.  3.  Plavix.  4.  Lipitor 40 daily.  5.  Lopressor 12.5 b.i.d.   PAST MEDICAL HISTORY:  Coronary artery disease (as described above),  hyperlipidemia, hypertension, documented PVCs.   PAST SURGICAL HISTORY:  Cholecystectomy 2005.   SOCIAL HISTORY:  Patient lives in [place] with his wife.  He is a Control and instrumentation engineer.  He has never smoked tobacco on a regular basis.  He drinks alcohol  only rarely.  He has 3 daughters.   FAMILY HISTORY:  Notable only for his father, who is in his early 71's and  is status post CABG.   REVIEW OF SYSTEMS:  As noted per HPI, otherwise negative for exertional  chest discomfort, orthopnea, dyspnea, PND  or pedal edema; no upper or lower  GI bleeding.  Remaining systems negative.   PHYSICAL EXAMINATION:  VITAL SIGNS:  Blood pressure 124/69, pulse 60 and  regular, respirations 20, temperature 98.6, SAO2 95 (RA), weight 193.  GENERAL:  A 55 year old male in no acute distress.  HEENT:  Normocephalic, atraumatic.  NECK:  Carotid pulses without bruits.  LUNGS:  Clear to auscultation in all fields.  CARDIOVASCULAR:  Regular rate and rhythm (S1/S2). No murmurs, rubs or  gallops.  ABDOMEN:  Soft, nontender.  Intact bowel sounds without bruits.  EXTREMITIES:  Preserved lateral femoral pulses without bruits; distal pulses  intact without edema.  NEUROLOGIC:  No focal deficit.   LABORATORY DATA:  EKG:  Sinus bradycardia of 51 BPM, left axis deviation;  poor R wave progression, no acute changes.  Laboratory data pending.   IMPRESSION:  1.  Unstable angina pectoris.  2.  Coronary artery disease.      1.  Status post acute ST elevation  myocardial infarction/emergent stent          left anterior descending July 2003.      2.  Non-ischemic exercise stress Cardiolite; ejection fraction 54%          August 2005.  3.  Sinus bradycardia.      1.  Tolerating beta-blocker.  4.  Dyslipidemia.  5.  Hypertension.  6.  History of premature ventricular contractions.   PLAN:  Although patient presents with symptoms which are somewhat atypical  for ischemia, they are reminiscent of his MI presentation and thus worrisome  for unstable angina pectoris.  Therefore, recommendation is to proceed with  diagnostic coronary angiography and possible percutaneous intervention.  Patient is agreeable with this plan.  Risks/benefits have been discussed and  he is scheduled for the procedure tomorrow with Dr. Shawnie Pons.   Regarding medications, he will be continued on current home regimen,  including low dose Lopressor and we will intravenous nitroglycerin and  heparin.  If cardiac enzymes are abnormal, our recommendation is to start  Integrilin.  Routine labs will be checked, cardiac markers will be cycled,  and a fasting lipid profile is ordered for the morning.      GS/MEDQ  D:  12/03/2004  T:  12/03/2004  Job:  [id number]   cc:   Titus Dubin. Alwyn Ren, M.D. [facility]

## 2011-04-05 NOTE — H&P (Signed)
[place]  Patient:    Zachary [name], Zachary [name] Visit Number: [id number] MRN: [id number]          Service Type: MED Location: 2000 2014 01 Attending Physician:  Nathen May Dictated by:   Nathen May, M.D., [facility] Admit Date:  06/03/2002                           History and Physical  HISTORY OF PRESENT ILLNESS:  The patient is a 55 year old gentleman with an insignificant past medical history apart from borderline blood pressure and a family history of coronary artery disease who presents tonight with four days of stuttering abdominal discomfort and unusual chest discomfort that was actually worse with recumbency and improved with sitting.  It was not particularly exertional.  This evening the pain became unrelenting about 10 p.m. and he came to the emergency room.  At that time he was found to have 3-4 mm of ST segment elevation across his anterior precordium.  His cardiac risk factors are as noted above.  He does not use cigarettes.  He does not know his cholesterol.  PAST MEDICAL HISTORY:  Notable in addition to the above for some back problems.  He has had no other surgeries.  MEDICATIONS:  He takes no medications.  ALLERGIES:  CLAMS.  SOCIAL HISTORY:  He is remarried.  He has a daughter from a previous marriage. He is a Orthoptist.  PHYSICAL EXAMINATION:  GENERAL:  He is in obvious acute distress.  VITAL SIGNS:  Blood pressure 165/104, heart rate 54, respirations 22, and he was using 100% rebreather.  His oxygen saturations were 100%.  HEENT:  Demonstrated no scleral icterus, no xanthomata.  He was diaphoretic.  NECK:  His neck veins were flat.  His carotids were brisk and full bilaterally without bruits.  LUNGS:  The lungs were listened to laterally and they were clear.  HEART:  His heart sounds were notable for the absence of a rub.  There was an S4.  There were no significant murmurs.  ABDOMEN:  Soft  with active bowel sounds without midline pulsation or hepatomegaly.  EXTREMITIES:  Femoral pulses were 2+.  Distal pulses are were intact.  There was no clubbing, cyanosis, or edema.  NEUROLOGIC:  Grossly normal.  LABORATORY DATA:  Electrocardiogram demonstrated sinus rhythm at 49 with intervals of 0.16/0.10/0.40.  As noted there was 3-4 mm of ST segment elevation in leads V2, V3, and V4, and 1-2 mm in leads V1 and V5.  IMPRESSION: 1. Acute anterior wall myocardial infarction with stuttering pain for the last    three to four days. 2. Cardiac risk factors notable for:    a. Hypertension.    b. Family history.    c. Unknown cholesterol status. 3. Back pain. 4. History of CLAM allergy.  The patient is having an acute myocardial infarction involving his left anterior descending.  It is extensive.  We will plan to go to the cardiac catheterization laboratory.  He has been premedicated with methylprednisolone, Benadryl, and IV Pepcid.  He is being treated with heparin, nitroglycerin, Integrilin, and Plavix. Dictated by:   Nathen May, M.D., [facility] Attending Physician:  Nathen May DD:  06/03/02 TD:  06/05/02 Job: [id number]

## 2011-04-05 NOTE — Cardiovascular Report (Signed)
Zachary [name], Zachary [name]                ACCOUNT NO.:  [id number]   MEDICAL RECORD NO.:  [id number]          PATIENT TYPE:  INP   LOCATION:  5502                         FACILITY:  [facility]   PHYSICIAN:  Rollene Rotunda, M.D.   DATE OF BIRTH:  Mar 23, 1956   DATE OF PROCEDURE:  04/22/2005  DATE OF DISCHARGE:                              CARDIAC CATHETERIZATION   PRIMARY CARE PHYSICIAN:  Titus Dubin. Alwyn Ren, MD, [facility].   CARDIOLOGIST:  Arturo Morton. Riley Kill, MD, [facility].   PROCEDURE:  Left heart catheterization/coronary arteriography.   INDICATION:  Evaluate patient with chest pain and previous stenting to his  LAD and right coronary.   PROCEDURE NOTE:  Left heart catheterization was performed via the right  femoral artery.  The artery was cannulated using an anterior wall puncture.  A #6-French arterial sheath was inserted in the modified Seldinger  technique.  Preformed Judkins and a pigtail catheter were utilized.  The  patient tolerated the procedure well and left the lab in stable condition.   RESULTS:   HEMODYNAMICS:  LV 140/80, AL 138/78.   CORONARIES:  The left main was normal.  The LAD had proximal and mid tandem  stents.  In the distal portion of the first stent, there was a 60-70%  restenosis and the second stent was widely patent.  The first and third  diagonals were small and normal.  The second diagonal was moderate sized  with ostial 30% stenosis.  The LAD itself had a distal, long, 25% stenosis.  The circumflex in the AV groove had mid, long, 25-30% stenosis.  The OM-1  was small with diffuse luminal irregularities.  The OM-2 was small and  normal.  The OM-2 was long with proximal 30% stenosis.  The OM-4 had mid,  30% stenosis.  The right coronary artery was long with mid 25% stenosis.  There was a proximal stent, which was widely patent.   LEFT VENTRICULOGRAM:  The left ventriculogram was obtained in the RAO  projection.  The EF was 55% with mild anteroapical hyperkinesis.   CONCLUSION:  Nonobstructive residual coronary artery disease with moderate  LAD in-stent restenosis.   PLAN:  To further assess the hemodynamic significance of the LAD restenosis,  I would suggest a Cardiolite.  Dr. Riley Kill will review this in the morning.      JH/MEDQ  D:  04/22/2005  T:  04/22/2005  Job:  [id number]   cc:   Titus Dubin. Alwyn Ren, M.D. [facility]

## 2011-04-05 NOTE — H&P (Signed)
NAME:  Zachary [name], Zachary [name]                            ACCOUNT NO.:  [id number]   MEDICAL RECORD NO.:  [id number]                   PATIENT TYPE:  INP   LOCATION:  4713                                 FACILITY:  [facility]   PHYSICIAN:  Madolyn Frieze. Jens Som, M.D. [facility]         DATE OF BIRTH:  11-Jun-1956   DATE OF ADMISSION:  07/28/2002  DATE OF DISCHARGE:                                HISTORY & PHYSICAL   HISTORY OF PRESENT ILLNESS:  The patient is a 55 year old male with a past  medical history of coronary disease, hypertension, hyperlipidemia, who we  were asked to evaluate for syncope. The patient's cardiac history dates back  to July 17 of this year when he presented with an acute myocardial  infarction. He underwent urgent catheterization by Dr. Riley Kill and was found  to have an ejection fraction 40% to 57% depending on the view. He had PCI of  the LAD at that time. Since then, he has done well with no exertional chest  pain, dyspnea on exertion, orthopnea, PND, pedal edema, palpitations. The  patient developed right-sided abnormal pain yesterday and came to the  emergency room. Of note, his liver functions, lipase, and UA were normal. He  did have a gallbladder ultrasound that revealed gallstones but his common  bile duct was normal. He was discharged with instructions to follow up with  one of the surgeons.  He also was given prescriptions for Vicodin and  clonazepam. He did take one of each of those last p.m.  He awoke with nausea  this morning and stood to go to the bathroom.  He subsequently became dizzy  and had a fine syncopal episode. He states he was out for approximately 1-  1/2 to 2 minutes. There was no associated chest pain, dyspnea, or  palpitations.  There was no loss of strength or sensation in his extremities  nor was there incontinence. The patient is presently asymptomatic other than  his continued abdominal pain. Of note, he denies any fevers or chills and  there is no  acholic stools. He has had no prior abdominal surgeries.  There  has been no melena or hematochezia.   MEDICATIONS AT PRESENTATION:  1. Aspirin 325 two daily  2. Toprol-XL 50 mg 2 daily  3. Diovan 80 mg daily  4. Pravachol 40 mg 2 daily  5. Clonazepam 0.05 mg p.o. daily chest p.r.n.  6. Plavix 75 mg p.o. daily   ALLERGIES:  He is allergic to SHELLFISH.   PAST MEDICAL HISTORY:  His past medical history is significant for  hypertension and hyperlipidemia, but there is no diabetes mellitus. He has a  history of coronary artery disease as outlined above. He has also had prior  tonsillectomy.   SOCIAL HISTORY:  He does not smoke nor does he consume alcohol.   FAMILY HISTORY:  Positive for coronary artery disease.   REVIEW OF SYSTEMS:  He denies any  headache, fevers or chills. There is no  productive cough or hemoptysis.  There is no dysphagia, odynophagia, melena,  or hematochezia. There is no dysuria or hematuria.  There is no rash or  seizure activity. There is no orthopnea, PND, or pedal edema. There is no  claudication noted.  The remainder of systems are negative.   PHYSICAL EXAMINATION:  VITAL SIGNS:  His physical examination today shows a  blood pressure of 119/66 and his pulse is 50.  It was in the 40s on presentation. His respiratory rate 16. He is afebrile.  GENERAL:  He is well-developed and well-nourished in no acute distress.  SKIN:  Warm and dry.  HEENT:  Unremarkable.  No __________ .  NECK:  Supple. No __________ bilaterally and there are no bruits noted.  There is no jugular venous distention, no thyromegaly that appeared.  CHEST:  Clear to auscultation.  Normal expansion.  CARDIOVASCULAR:  There is a bradycardic rate, but a regular rhythm. There  are no murmurs, rubs or gallops noted.  ABDOMEN:  His abdominal examination is significant for pain to palpation in  the right abdomen towards the mid level.  There are no masses appreciated.  There is no abdominal  bruit. There was no hepatosplenomegaly noted.  PULSES:  He has 2+ femoral pulses bilaterally and no bruits.  EXTREMITY:  Show no edema that I can palpate.  No cords. He has 2+ dorsalis  pedis pulses bilaterally.  NEUROLOGIC:  Grossly intact.   LABORATORY DATA:  His electrocardiogram shows a sinus bradycardia at a rate  of 51. There is a prior anterior infarct with anterolateral T wave  inversion. This is improved from previous.  His white blood cell count is  6.7 with a hemoglobin of 14.4 and hematocrit of 40.9.  His platelet count is  120.  His BUN and creatinine are 16 and 1.1. His liver functions from last  p.m. were normal. His initial CK-MB and troponin I are normal.   DIAGNOSES:  1. Syncope episode.  2. History of coronary artery disease, status post recent infarct with     percutaneous coronary intervention of the left anterior descending.  3. Left ventricular function at time of catheterization being 40-57%     depending on the view per Dr. Riley Kill.  4. Right abdominal pain, question cholecystitis.  5. Hypertension.  6. Hyperlipidemia.   PLAN:  The patient presents with syncope of uncertain etiology.  I feel this  is most likely vagal secondary to his abdominal pain associated with nausea.  It may also be a contribution from his pain medications. Of note, he was not  orthostatic in the emergency room.  We will plan to admit to telemetry and  cycle enzymes. We will also check an echocardiogram to quantify LV function.  If his enzymes are negative and his ejection fraction is greater than 40%  and there is no rhythm disturbance on telemetry, then I do not feel further  cardiac work-up is indicated. He continues to have right abdominal pain.  This is in an unusual location for cholecystitis, but there was gallstones  on his ultrasound. We will recheck liver function as well as amylase and lipase. We will ask the surgeons to evaluate the patient for question need  for  cholecystectomy.  Finally, the patient was somewhat bradycardic on  arrival and we will decrease his Toprol-XL to 25 mg p.o. daily.  Madolyn Frieze Jens Som, M.D. [facility]    BSC/MEDQ  D:  07/28/2002  T:  07/29/2002  Job:  [id number]

## 2011-04-05 NOTE — Discharge Summary (Signed)
NAME:  Zachary [name], Zachary [name]                            ACCOUNT NO.:  [id number]   MEDICAL RECORD NO.:  [id number]                   PATIENT TYPE:  INP   LOCATION:  4713                                 FACILITY:  [facility]   PHYSICIAN:  Arturo Morton. Riley Kill, M.D. [facility]         DATE OF BIRTH:  1956/03/18   DATE OF ADMISSION:  07/28/2002  DATE OF DISCHARGE:  07/29/2002                           DISCHARGE SUMMARY - REFERRING   PROCEDURES:  1. A 2D echocardiogram.  2. HIDA scan.   HOSPITAL COURSE:  The patient is a 55 year old male with a history of  coronary artery disease. He was  here in July 2003 with an MI at which time  he got percutaneous intervention on his LAD. Since discharge he had been  doing well without chest pain, shortness of breath or palpitations. He had  been attending cardiac rehab and progressing well with this as well. He  developed right sided abdominal pain on the day prior to admission and came  to the emergency room. His liver functions were normal and his amylase and  lipase were also within normal limits. A urinalysis was negative. He had a  gallbladder ultrasound which showed gallstones but normal duct size. He was  discharged from the emergency room with Vicodin and takes that medication as  well as his prescribed clonazepam on the night before admission. He awoke on  the day of admission with nausea, and had a syncopal episode when  he stood  up. He did not have any chest pain, dizziness or palpitations. There was no  prodrome, no aura and no sequelae. He was asymptomatic upon exam. He was  tender in his right mid abdomen upon exam but with no physical findings. He  was  admitted for further evaluation and treatment.   Because of the abdominal tenderness, a surgical consult was called. He was  seen by Dr. Derrell Lolling, who ordered a HIDA scan. The HIDA scan also known as  hepatobiliary liver function test showed prompt hepatic uptake with the  gallbladder filling by 25 minutes  and bowel activity noted by 35 minutes. It  was a normal study.  He was  given an initial dose of antibiotics, but when  Dr. Derrell Lolling reevaluated him and looked at the HIDA scan, he felt that ongoing  antibiotic therapy was not needed. He was  to follow up with Dr. Derrell Lolling in  three to four weeks and consideration at that time will be given to an  elective cholecystectomy.   The patient had an 11 beat run of ventricular tachycardia during a routine  laboratory draw, but he was  asymptomatic. He is on a beta blocker and is to  be continued on this. No further workup is indicated.   He had enzymes checked that were negative for MI and he had an  echocardiogram done as part of  his workup. The 2D echocardiogram had an EF  of 60%  with left atrial size at the upper limits of normal and akinesis of  the localized area of the  apical aspect of the  septum  and anterior wall  with no definite clot.   The laboratory tests as well as the echocardiograms were reviewed by Dr.  Riley Kill. It was felt that his pain had not  been cardiac in origin and he  has outpatient followup scheduled for a possible cholecystectomy in the  future. He had been scheduled previously for a Cardiolite as an outpatient  in order to see if he was stable for advancing in cardiac rehab,  but this  was rescheduled and he is to follow up with Dr. Riley Kill after obtaining  this. He was considered stable for discharge on July 29, 2002.   LABORATORY DATA:  Hemoglobin 15.1, hematocrit 42.5, WBCs 7.2, platelets 186.  Sodium 141, potassium 3.8, chloride 108, CO2 27, BUN 9, creatinine  0.8,  glucose 109. Serial CK-MB negative for MI. Amylase 56, lipase 24.   CONDITION ON DISCHARGE:  Stable.   CONSULTS:  Dr. Derrell Lolling.   DISCHARGE DIAGNOSES:  1. Abdominal pain, secondary to cholelithiasis, followup with Dr. Derrell Lolling.  2. Status post percutaneous transluminal coronary angioplasty and stent to     the left anterior descending artery  in July 2003 secondary to myocardial     infarction.  3. Syncope, possible orthostatic, secondary to nausea and medications.  4. Hyperlipidemia.  5. Hypertension.  6. Status post tonsillectomy and adenoidectomy.  7. History of insomnia.  8. Residual disease of the circumflex and right coronary artery with 50% in     the circumflex and 50% to 70% in the right coronary artery.   DISCHARGE INSTRUCTIONS:  His activity level  is to include no driving until  cleared by M.D. He is  to stick to a low fat and salt diet.   FOLLOW UP:  He is to follow up tomorrow for stress Cardiolite. He is to  follow up with Dr. Derrell Lolling in a month. He is to see the P.A. for Dr. Riley Kill  on August 09, 2002, at 11 a.m.   DISCHARGE MEDICATIONS:  1. Coated aspirin 325 mg q.d.  2. Plavix 75 mg q.d.  3. Klonopin 0.5 mg q.h.s.  4. Diovan 80 mg q.d.  5. Toprol  XL 50 mg q.d.  6. Pravachol 40 mg q.d.  7. Nitroglycerin 0.4 mg p.r.n.       Lavella Hammock, P.A. [facility]                  Thomas D. Riley Kill, M.D. [facility]    RG/MEDQ  D:  07/29/2002  T:  07/31/2002  Job:  [id number]   cc:   Arturo Morton. Riley Kill, M.D. [facility] M. Derrell Lolling, M.D.  Fax: [phone]   Titus Dubin. Alwyn Ren, M.D. [facility]

## 2011-05-06 ENCOUNTER — Ambulatory Visit (HOSPITAL_BASED_OUTPATIENT_CLINIC_OR_DEPARTMENT_OTHER)
Admission: RE | Admit: 2011-05-06 | Discharge: 2011-05-06 | Disposition: A | Discharge status: Home / Self Care | Payer: 59 | Source: Ambulatory Visit | Attending: Family | Admitting: Family

## 2011-05-06 ENCOUNTER — Telehealth: Payer: Self-pay | Admitting: Family

## 2011-05-06 ENCOUNTER — Encounter: Payer: Self-pay | Admitting: Family

## 2011-05-06 ENCOUNTER — Ambulatory Visit (INDEPENDENT_AMBULATORY_CARE_PROVIDER_SITE_OTHER): Payer: 59 | Admitting: Family

## 2011-05-06 DIAGNOSIS — M5137 Other intervertebral disc degeneration, lumbosacral region: Secondary | ICD-10-CM | POA: Insufficient documentation

## 2011-05-06 DIAGNOSIS — K7689 Other specified diseases of liver: Secondary | ICD-10-CM | POA: Insufficient documentation

## 2011-05-06 DIAGNOSIS — K5732 Diverticulitis of large intestine without perforation or abscess without bleeding: Secondary | ICD-10-CM

## 2011-05-06 DIAGNOSIS — K5792 Diverticulitis of intestine, part unspecified, without perforation or abscess without bleeding: Secondary | ICD-10-CM

## 2011-05-06 DIAGNOSIS — R109 Unspecified abdominal pain: Secondary | ICD-10-CM

## 2011-05-06 DIAGNOSIS — M51379 Other intervertebral disc degeneration, lumbosacral region without mention of lumbar back pain or lower extremity pain: Secondary | ICD-10-CM | POA: Insufficient documentation

## 2011-05-06 DIAGNOSIS — R1032 Left lower quadrant pain: Secondary | ICD-10-CM

## 2011-05-06 LAB — POCT URINALYSIS DIPSTICK
Bilirubin, UA: NEGATIVE
Glucose, UA: NEGATIVE
Ketones, UA: NEGATIVE
Leukocytes, UA: NEGATIVE
pH, UA: 6

## 2011-05-06 LAB — CBC WITH DIFFERENTIAL/PLATELET
Basophils Relative: 0 % (ref 0–1)
HCT: 45.3 % (ref 39.0–52.0)
Hemoglobin: 16.4 g/dL (ref 13.0–17.0)
Lymphocytes Relative: 25 % (ref 12–46)
Lymphs Abs: 3 10*3/uL (ref 0.7–4.0)
MCHC: 36.2 g/dL — ABNORMAL HIGH (ref 30.0–36.0)
Monocytes Absolute: 0.9 10*3/uL (ref 0.1–1.0)
Monocytes Relative: 8 % (ref 3–12)
Neutro Abs: 7.7 10*3/uL (ref 1.7–7.7)
RBC: 5.1 MIL/uL (ref 4.22–5.81)

## 2011-05-06 MED ORDER — IOHEXOL 300 MG/ML  SOLN
100.0000 mL | Freq: Once | INTRAMUSCULAR | Status: AC | PRN
  Administered 2011-05-06: 100 mL via INTRAVENOUS

## 2011-05-06 MED ORDER — METRONIDAZOLE 500 MG PO TABS: 500.0000 mg | ORAL_TABLET | Freq: Three times a day (TID) | ORAL | Status: AC

## 2011-05-06 MED ORDER — CIPROFLOXACIN HCL 500 MG PO TABS: 500.0000 mg | ORAL_TABLET | Freq: Two times a day (BID) | ORAL | Status: AC

## 2011-05-06 NOTE — Telephone Encounter (Signed)
Reviewed CT abdomen/pelvis results: acute uncomplicated diverticulitis.  Phoned patient.  Reviewed findings.  Recommended that he start cipro/flagyl tonight, full liquid diet only for now until he sees Dr. Alwyn Ren in 2 days in the office.  He is instructed to go to the ER if he develops fever, worsening abdominal pain, or bloody stools.  Tylenol or motrin PRN pain. Pt verbalizes understanding.

## 2011-05-06 NOTE — Patient Instructions (Signed)
Please complete your CT scan on the first floor. We will cal you with the results.  Go to ER if you develop worsening abdominal pain. Follow up in 1 week.

## 2011-05-06 NOTE — Progress Notes (Signed)
Subjective:    Patient ID: Zachary Stone, male    DOB: 10-Nov-1956, 55 y.o.   MRN: [id number]  HPI  Zachary Stone is a 55 yr old male who presents today with chief complaint of LLQ tenderness.  He reports pain as moderate to severe at times.  LLQ tenderness for several days which he attributed to exercising at the [facility].  Now with LLQ pain- worse with standing, improved with staying still.  Pain is a "shooting pain." Denies fever, dysuria, hematuria.  He had a BM today- normal and brown, no BRBPR.  Denies history of similar pain.  He reports + hx of diverticulitis 10-12 yrs ago. No alleviating factors.     Review of Systems  See HPI  Past Medical History  Diagnosis Date  . Diverticulosis     on CT scan 2009  . Diverticulitis     on CT 2012    History   Social History  . Marital Status: Married    Spouse Name: N/A    Number of Children: N/A  . Years of Education: N/A   Occupational History  . Not on file.   Social History Main Topics  . Smoking status: Never Smoker   . Smokeless tobacco: Not on file  . Alcohol Use: Yes  . Drug Use: No  . Sexually Active: Not on file   Other Topics Concern  . Not on file   Social History Narrative  . No narrative on file    Past Surgical History  Procedure Date  . Cholecystectomy   . Coronary angioplasty with stent placement     Dr.Stuckey  . Tonsillectomy   . Colonoscopy 2007    Neg, recheck 2017  . Vasectomy     Family History  Problem Relation Age of Onset  . Coronary artery disease Mother   . Skin cancer Maternal Uncle   . Hyperlipidemia      Allergies  Allergen Reactions  . Clams (Shellfish Allergy)   . Iodine     Patient has never had CT contrast to know if he is allergic to Iodine.  Only allergic to Clams and Morphine.  JWM 05/06/11:  Had Ct contrast today and did not have a reaction.  . Morphine     Current Outpatient Prescriptions on File Prior to Visit  Medication Sig Dispense Refill  . aspirin 81 MG  tablet Take 81 mg by mouth daily.        . clonazePAM (KLONOPIN) 0.5 MG tablet Take 1 tablet (0.5 mg total) by mouth at bedtime as needed.  90 tablet  0  . clopidogrel (PLAVIX) 75 MG tablet Take 1 tablet (75 mg total) by mouth daily.  90 tablet  4  . ezetimibe-simvastatin (VYTORIN) 10-20 MG per tablet Take 1 tablet by mouth at bedtime.        . Multiple Vitamin (MULTIVITAMIN) tablet Take 1 tablet by mouth daily.        . valsartan (DIOVAN) 80 MG tablet Take 1 tablet (80 mg total) by mouth daily.  90 tablet  4    BP 120/80  Pulse 64  Temp(Src) 97.8 F (36.6 C) (Oral)  Resp 15  Wt 196 lb (88.905 kg)        Objective:   Physical Exam  Constitutional: He appears well-developed and well-nourished.  Cardiovascular: Normal rate and regular rhythm.   Pulmonary/Chest: Effort normal and breath sounds normal.  Abdominal: Soft. Bowel sounds are normal. He exhibits no distension and no mass. There  is tenderness. There is guarding. There is no rebound.       +LLQ tenderness with guarding.  Psychiatric: He has a normal mood and affect. His behavior is normal. Judgment and thought content normal.          Assessment & Plan:

## 2011-05-07 ENCOUNTER — Telehealth: Payer: Self-pay | Admitting: Family

## 2011-05-07 NOTE — Telephone Encounter (Signed)
[facility] Triage Call Report Triage Record Num: [id number] Operator: Sula Rumple Patient Name: Jasun Gasparini Call Date & Time: 05/06/2011 10:50:38PM Patient Phone: [phone] PCP: Patient Gender: Male PCP Fax : Patient DOB: 11/14/1956 Practice Name: [facility] Reason for Call: Mary/calling on 05/06/11 from [facility] states pt has high WBC of 11.7/MCHC High 36.2/no critical/ no action taken. Protocol(s) Used: PCP Calls, No Triage (Adult) Recommended Outcome per Protocol: Call Provider within 24 Hours Reason for Outcome: Lab calling with test results Care Advice: ~

## 2011-05-09 ENCOUNTER — Ambulatory Visit (INDEPENDENT_AMBULATORY_CARE_PROVIDER_SITE_OTHER): Payer: 59 | Admitting: Internal Medicine

## 2011-05-09 ENCOUNTER — Encounter: Payer: Self-pay | Admitting: Internal Medicine

## 2011-05-09 DIAGNOSIS — K5732 Diverticulitis of large intestine without perforation or abscess without bleeding: Secondary | ICD-10-CM

## 2011-05-09 DIAGNOSIS — R197 Diarrhea, unspecified: Secondary | ICD-10-CM

## 2011-05-09 DIAGNOSIS — K5792 Diverticulitis of intestine, part unspecified, without perforation or abscess without bleeding: Secondary | ICD-10-CM | POA: Insufficient documentation

## 2011-05-09 DIAGNOSIS — H6980 Other specified disorders of Eustachian tube, unspecified ear: Secondary | ICD-10-CM | POA: Insufficient documentation

## 2011-05-09 MED ORDER — FLUTICASONE PROPIONATE 50 MCG/ACT NA SUSP: 1.0000 | Freq: Every day | NASAL | Status: DC

## 2011-05-09 NOTE — Progress Notes (Signed)
  Subjective:    Patient ID: Zachary Stone, male    DOB: 07/19/1956, 55 y.o.   MRN: [id number]  HPI Results of serial CT scans reviewed & copies given.    Review of Systems     Objective:   Physical Exam        Assessment & Plan:

## 2011-05-09 NOTE — Patient Instructions (Signed)
Advance diet as discussed; avoid milk/dairy &  grease .

## 2011-05-09 NOTE — Assessment & Plan Note (Signed)
CT abd/pelvis notes uncomplicated diverticulitis.  See phone note. Pt was treated with cipro/flagyl and instructed to f/u with Dr. Alwyn Ren in 2 days.

## 2011-05-09 NOTE — Progress Notes (Signed)
  Subjective:    Patient ID: Zachary Stone, male    DOB: [birth year], 55 y.o.   MRN: [id number]  HPI    Review of Systems     Objective:   Physical Exam        Assessment & Plan:

## 2011-05-09 NOTE — Progress Notes (Signed)
  Subjective:    Patient ID: Zachary Stone, male    DOB: 09/28/1956, 55 y.o.   MRN: [id number]  HPI    Review of Systems     Objective:   Physical Exam        Assessment & Plan:   

## 2011-05-09 NOTE — Progress Notes (Signed)
  Subjective:    Patient ID: Zachary Stone, male    DOB: 08/14/1956, 55 y.o.   MRN: [id number]  HPI ABDOMINAL PAIN Location: LLQ  Onset: 6/18   Radiation: no  Severity: up to 8 Quality: stabbing 6/18, now dull  Duration: resolving   Better with: Cipro & Flagyl  Rxed 6/18 ; also  NSAIDS  Worse with: movement; RLDP Symptoms Nausea/Vomiting: no  Diarrhea: yes  Constipation: no  Melena/BRBPR: no  Hematemesis: no  Anorexia: yes  Fever/Chills: no  Dysuria: no  But urine dark; stool NOT light Wt loss: yes, 3# on liquid diet    Past Surgeries: Colonoscopy 2008     Review of Systems R ear:"dullness of hearing, like water"  Intermittently  X 4 weeks     Objective:   Physical Exam General appearance is of good health and nourishment; no acute distress or increased work of breathing is present.  No  lymphadenopathy about the head, neck, or axilla noted.   Eyes: No conjunctival inflammation or lid edema is present. There is no scleral icterus.  Ears:  External ear exam shows no significant lesions or deformities.  Otoscopic examination reveals clear canals, tympanic membranes are intact bilaterally without bulging, retraction, inflammation or discharge. Normal tuning fork exam  Nose:  External nasal examination shows no deformity or inflammation. Nasal mucosa are pink and moist without lesions or exudates. No septal dislocation or dislocation.No obstruction to airflow.   Oral exam: Dental hygiene is good; lips and gums are healthy appearing.There is no oropharyngeal erythema or exudate noted.   Heart:  Normal rate and regular rhythm. S1 and S2 normal without gallop, murmur, click, rub or other extra sounds.   Lungs:Chest clear to auscultation; no wheezes, rhonchi,rales ,or rubs present.No increased work of breathing.    Abdomen: bowel sounds normal, soft  But tender LLQ  without masses, organomegaly or hernias noted.  No guarding or rebound  Extremities:  No cyanosis, edema, or  clubbing  noted    Skin: Warm & dry w/o jaundice or tenting.           Assessment & Plan:  #1Diverticulitis #2 Diarrhea, ? Due to liquid diet #3 Eustachian tube dysfunction Plan: advance diet; Immodium AD as needed; Align  Daily until stools normal. Stool for C difficle if diarrhea persists Flonase twice a day as needed

## 2011-05-21 ENCOUNTER — Other Ambulatory Visit: Payer: Self-pay | Admitting: Cardiology

## 2011-05-21 MED ORDER — CLOPIDOGREL BISULFATE 75 MG PO TABS: 75.0000 mg | ORAL_TABLET | Freq: Every day | ORAL | Status: DC

## 2011-05-21 NOTE — Telephone Encounter (Signed)
Called pt to get location of pharmacy. This pharmacy only shows up in other states and I was not aware if patient was traveling.  Left message on VCML.  Judithe Modest, CMA

## 2011-05-21 NOTE — Telephone Encounter (Signed)
Pt does not use [facility] as documented.  Pt uses [facility] in [place].  Faxed Rx to [phone].  Judithe Modest, CMA

## 2011-05-21 NOTE — Telephone Encounter (Signed)
plavix 75 mg. [facility].

## 2011-07-16 ENCOUNTER — Other Ambulatory Visit: Payer: Self-pay | Admitting: Internal Medicine

## 2011-07-16 MED ORDER — CLONAZEPAM 0.5 MG PO TABS: 0.5000 mg | ORAL_TABLET | Freq: Every evening | ORAL | Status: DC | PRN

## 2011-07-16 NOTE — Telephone Encounter (Signed)
RX sent

## 2011-07-26 ENCOUNTER — Ambulatory Visit (INDEPENDENT_AMBULATORY_CARE_PROVIDER_SITE_OTHER): Payer: BC Managed Care – PPO | Admitting: Family

## 2011-07-26 ENCOUNTER — Encounter: Payer: Self-pay | Admitting: Family

## 2011-07-26 DIAGNOSIS — H938X9 Other specified disorders of ear, unspecified ear: Secondary | ICD-10-CM

## 2011-07-26 NOTE — Patient Instructions (Signed)
Call if your symptoms worsen, or if not resolved in 1 month.

## 2011-07-26 NOTE — Progress Notes (Signed)
Subjective:    Patient ID: Zachary Stone, male    DOB: June 11, 1956, 55 y.o.   MRN: [id number]  HPI  55 yr old male with difficulty hearing out of the right ear.  Feels like it is clogged.  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  Denies pain in the right ear.  He has been treated with a nasal spray without improvement.  Notes mild allergy symptoms.  "seemed to start after I went down a water slide."   Review of Systems See HPI  Past Medical History  Diagnosis Date  . Diverticulosis     on CT scan 2009  . Diverticulitis     on CT 2012    History   Social History  . Marital Status: Married    Spouse Name: N/A    Number of Children: N/A  . Years of Education: N/A   Occupational History  . Not on file.   Social History Main Topics  . Smoking status: Never Smoker   . Smokeless tobacco: Not on file  . Alcohol Use: Yes  . Drug Use: No  . Sexually Active: Not on file   Other Topics Concern  . Not on file   Social History Narrative  . No narrative on file    Past Surgical History  Procedure Date  . Cholecystectomy   . Coronary angioplasty with stent placement     Dr.Stuckey  . Tonsillectomy   . Colonoscopy 2007    Neg, recheck 2017  . Vasectomy     Family History  Problem Relation Age of Onset  . Coronary artery disease Mother   . Skin cancer Maternal Uncle   . Hyperlipidemia      Allergies  Allergen Reactions  . Clams (Shellfish Allergy)   . Iodine     Patient has never had CT contrast to know if he is allergic to Iodine.  Only allergic to Clams and Morphine.  JWM 05/06/11:  Had Ct contrast today and did not have a reaction.  . Morphine     Current Outpatient Prescriptions on File Prior to Visit  Medication Sig Dispense Refill  . aspirin 81 MG tablet Take 81 mg by mouth daily.        . clonazePAM (KLONOPIN) 0.5 MG tablet Take 1 tablet (0.5 mg total) by mouth at bedtime as needed.  90 tablet  0  .  clopidogrel (PLAVIX) 75 MG tablet Take 1 tablet (75 mg total) by mouth daily.  90 tablet  4  . ezetimibe-simvastatin (VYTORIN) 10-20 MG per tablet Take 1 tablet by mouth at bedtime.        . Multiple Vitamin (MULTIVITAMIN) tablet Take 1 tablet by mouth daily.        . valsartan (DIOVAN) 80 MG tablet Take 1 tablet (80 mg total) by mouth daily.  90 tablet  4    BP 114/78  Pulse 60  Temp(Src) 98 F (36.7 C) (Oral)  Resp 16       Objective:   Physical Exam  Constitutional: He appears well-developed and well-nourished.  HENT:  Head: Normocephalic.  Right Ear: External ear normal.  Left Ear: External ear normal.       Clear fluid noted behind bilateral TM's without bulging or erythema.   Cardiovascular: Normal rate and regular rhythm.   No murmur heard. Pulmonary/Chest: Effort normal and breath sounds normal. No respiratory distress. He has no wheezes. He has  no rales. He exhibits no tenderness.          Assessment & Plan:

## 2011-07-29 DIAGNOSIS — H938X9 Other specified disorders of ear, unspecified ear: Secondary | ICD-10-CM | POA: Insufficient documentation

## 2011-07-29 NOTE — Assessment & Plan Note (Signed)
I recommended that the patient add claritin once daily. I suspect that his primary issue at this time is congestion due to seasonal allergies.  I did complete a basic hearing test today in the office which was normal in both ears.  If symptoms worsen or do not improve, he is instructed to contact us.

## 2011-09-09 ENCOUNTER — Telehealth: Payer: Self-pay | Admitting: Cardiology

## 2011-09-09 MED ORDER — CLOPIDOGREL BISULFATE 75 MG PO TABS: 75.0000 mg | ORAL_TABLET | Freq: Every day | ORAL | Status: DC

## 2011-09-09 MED ORDER — VALSARTAN 80 MG PO TABS: 80.0000 mg | ORAL_TABLET | Freq: Every day | ORAL | Status: DC

## 2011-09-09 MED ORDER — EZETIMIBE-SIMVASTATIN 10-20 MG PO TABS: 1.0000 | ORAL_TABLET | Freq: Every day | ORAL | Status: DC

## 2011-09-09 NOTE — Telephone Encounter (Signed)
I spoke with the pt and he would like a Rx for Plavix sent to [facility].  The pt also needs Rx's sent to [facility] for Plavix, Diovan and Vytorin.  All prescriptions were sent to pharmacy.  The pt said that [facility] said that Vytorin and Diovan would require a prior authorization.  I made the pt aware that the drug company would fax these requests to our office if needed.

## 2011-09-09 NOTE — Telephone Encounter (Signed)
Pt calling to speak with Lauren regarding pt RX. Please return pt call to discuss further.   Pt calling wanting 30 day supply of plavix called into [facility] in [place] and 90 day supply of plavix called into a mail order company---it was difficult for me to fully understand why the pt needed to speak with Lauren. Please return pt call to discuss further.

## 2011-09-13 ENCOUNTER — Telehealth: Payer: Self-pay | Admitting: *Deleted

## 2011-09-13 MED ORDER — ACYCLOVIR 800 MG PO TABS: 800.0000 mg | ORAL_TABLET | Freq: Two times a day (BID) | ORAL | Status: DC

## 2011-09-13 NOTE — Telephone Encounter (Signed)
#  30 OK 

## 2011-09-13 NOTE — Telephone Encounter (Signed)
Done, patient informed to check with pharm

## 2011-09-13 NOTE — Telephone Encounter (Signed)
Patient requesting RF of acyclovir 800 mg 1 BID. Last filled 2009 # 30 w/1 RF, for genital herpes. He currently has a flare and would like RF. Ok?

## 2011-09-17 ENCOUNTER — Telehealth: Payer: Self-pay | Admitting: Cardiology

## 2011-09-17 NOTE — Telephone Encounter (Signed)
Pt called about prior auth for his rx- diovan 80 mg tab, vytorin 10-20 mg tab.  [facility] -[id number] prior auth number.  Please call him back when done.

## 2011-09-19 NOTE — Telephone Encounter (Signed)
Prior authorization approved for 1 year through 09-18-2013 on Diovan 80 mg 1 QD and Vytorin 10-20mg  1 QD. Pharmacy and patient notified   Vikki Ports

## 2011-11-04 ENCOUNTER — Telehealth: Payer: Self-pay | Admitting: Internal Medicine

## 2011-11-04 NOTE — Telephone Encounter (Signed)
Patient states that he saw Melissa last week for "earache". He would like a referral to an ENT sometime early next year but not the first week of January.

## 2011-11-05 NOTE — Telephone Encounter (Signed)
I last saw him in early September.  He should be re-evaluated in the office first please.

## 2011-11-05 NOTE — Telephone Encounter (Signed)
Left message on machine to return my call. 

## 2011-11-06 NOTE — Telephone Encounter (Signed)
Call placed to patient at [phone], no answer. A detailed voice message was left informing patient to schedule office visit per Sandford Craze in order to proceed with ENT referral.

## 2011-11-29 ENCOUNTER — Ambulatory Visit (INDEPENDENT_AMBULATORY_CARE_PROVIDER_SITE_OTHER): Payer: BC Managed Care – PPO | Admitting: Family

## 2011-11-29 ENCOUNTER — Encounter: Payer: Self-pay | Admitting: Family

## 2011-11-29 DIAGNOSIS — H9313 Tinnitus, bilateral: Secondary | ICD-10-CM

## 2011-11-29 DIAGNOSIS — H9319 Tinnitus, unspecified ear: Secondary | ICD-10-CM

## 2011-11-29 DIAGNOSIS — H9190 Unspecified hearing loss, unspecified ear: Secondary | ICD-10-CM

## 2011-11-29 NOTE — Patient Instructions (Signed)
You will be contacted about your referral to ENT.   

## 2011-11-29 NOTE — Progress Notes (Signed)
Subjective:    Patient ID: Zachary Stone, male    DOB: November 26, 1955, 56 y.o.   MRN: [id number]  HPI  Mr.  Stone is a 56 yr old male who presents today to discuss concerns about possible hearing loss.  He reports a sense of ear fullness, especially on the right.  Denies associated pain.  He was seen last September for same and was given a trial of claritin which he tells me did not help. He notes that he often has to turn the TV up higher than other people in the room.  Symptoms started 2 years ago.  He also reports a history of tInnitus- which "has been going on for years." This started in his teens and he tells me that the ringing is constant.  Review of Systems    see HPI  Past Medical History  Diagnosis Date  . Diverticulosis     on CT scan 2009  . Diverticulitis     on CT 2012    History   Social History  . Marital Status: Married    Spouse Name: N/A    Number of Children: N/A  . Years of Education: N/A   Occupational History  . Not on file.   Social History Main Topics  . Smoking status: Never Smoker   . Smokeless tobacco: Not on file  . Alcohol Use: Yes  . Drug Use: No  . Sexually Active: Not on file   Other Topics Concern  . Not on file   Social History Narrative  . No narrative on file    Past Surgical History  Procedure Date  . Cholecystectomy   . Coronary angioplasty with stent placement     Dr.Stuckey  . Tonsillectomy   . Colonoscopy 2007    Neg, recheck 2017  . Vasectomy     Family History  Problem Relation Age of Onset  . Coronary artery disease Mother   . Skin cancer Maternal Uncle   . Hyperlipidemia      Allergies  Allergen Reactions  . Clams (Shellfish Allergy)   . Iodine     Patient has never had CT contrast to know if he is allergic to Iodine.  Only allergic to Clams and Morphine.  JWM 05/06/11:  Had Ct contrast today and did not have a reaction.  . Morphine     Current Outpatient Prescriptions on File Prior to Visit  Medication  Sig Dispense Refill  . aspirin 81 MG tablet Take 81 mg by mouth daily.        . clonazePAM (KLONOPIN) 0.5 MG tablet Take 1 tablet (0.5 mg total) by mouth at bedtime as needed.  90 tablet  0  . clopidogrel (PLAVIX) 75 MG tablet Take 1 tablet (75 mg total) by mouth daily.  90 tablet  3  . ezetimibe-simvastatin (VYTORIN) 10-20 MG per tablet Take 1 tablet by mouth at bedtime.  90 tablet  3  . Multiple Vitamin (MULTIVITAMIN) tablet Take 1 tablet by mouth daily.        . valsartan (DIOVAN) 80 MG tablet Take 1 tablet (80 mg total) by mouth daily.  90 tablet  3    BP 112/84  Pulse 72  Temp(Src) 98.1 F (36.7 C) (Oral)  Resp 16  Ht 5' 4.02" (1.626 m)  Wt 202 lb 1.3 oz (91.663 kg)  BMI 34.67 kg/m2    Objective:   Physical Exam  Constitutional: He appears well-developed and well-nourished. No distress.  HENT:  Head: Normocephalic and  atraumatic.  Right Ear: Tympanic membrane and ear canal normal.  Left Ear: Tympanic membrane and ear canal normal.  Cardiovascular: Normal rate and regular rhythm.   No murmur heard. Pulmonary/Chest: Effort normal and breath sounds normal. No respiratory distress. He has no wheezes. He has no rales. He exhibits no tenderness.  Psychiatric: He has a normal mood and affect. His behavior is normal. Judgment and thought content normal.          Assessment & Plan:  Difficulty hearing 4000Hz  left ear.

## 2011-12-02 DIAGNOSIS — H9313 Tinnitus, bilateral: Secondary | ICD-10-CM | POA: Insufficient documentation

## 2011-12-02 NOTE — Assessment & Plan Note (Signed)
Refer to ENT for evaluation of tinnitus and hearing.

## 2011-12-04 ENCOUNTER — Other Ambulatory Visit: Payer: Self-pay | Admitting: Internal Medicine

## 2011-12-04 MED ORDER — CLONAZEPAM 0.5 MG PO TABS: 0.5000 mg | ORAL_TABLET | Freq: Every evening | ORAL | Status: DC | PRN

## 2011-12-04 NOTE — Telephone Encounter (Signed)
The pt called and is hoping to get a refill of Klonopin .5mg  sent to the [facility] in [place].   Thanks!

## 2011-12-04 NOTE — Telephone Encounter (Signed)
Rx sent 

## 2012-01-10 ENCOUNTER — Telehealth: Payer: Self-pay | Admitting: Cardiology

## 2012-01-10 NOTE — Telephone Encounter (Signed)
Error  Patient found RX while on the phone

## 2012-02-28 ENCOUNTER — Telehealth: Payer: Self-pay | Admitting: Cardiology

## 2012-02-28 DIAGNOSIS — I10 Essential (primary) hypertension: Secondary | ICD-10-CM

## 2012-02-28 DIAGNOSIS — I2510 Atherosclerotic heart disease of native coronary artery without angina pectoris: Secondary | ICD-10-CM

## 2012-02-28 DIAGNOSIS — E785 Hyperlipidemia, unspecified: Secondary | ICD-10-CM

## 2012-02-28 NOTE — Telephone Encounter (Signed)
I spoke with the Zachary Stone and scheduled him to see Dr Riley Kill on 03/10/12.  The Zachary Stone will have labs drawn on 03/09/12.

## 2012-02-28 NOTE — Telephone Encounter (Signed)
New msg Pt has some questions about when he needs to be seen again. Please call

## 2012-03-03 ENCOUNTER — Ambulatory Visit: Payer: BC Managed Care – PPO | Admitting: Internal Medicine

## 2012-03-05 ENCOUNTER — Encounter: Payer: Self-pay | Admitting: Internal Medicine

## 2012-03-05 ENCOUNTER — Ambulatory Visit (INDEPENDENT_AMBULATORY_CARE_PROVIDER_SITE_OTHER): Payer: BC Managed Care – PPO | Admitting: Internal Medicine

## 2012-03-05 VITALS — BP 126/84 | HR 62 | Temp 98.4°F | Wt 191.6 lb

## 2012-03-05 DIAGNOSIS — R259 Unspecified abnormal involuntary movements: Secondary | ICD-10-CM

## 2012-03-05 DIAGNOSIS — R251 Tremor, unspecified: Secondary | ICD-10-CM

## 2012-03-05 DIAGNOSIS — R269 Unspecified abnormalities of gait and mobility: Secondary | ICD-10-CM

## 2012-03-05 MED ORDER — PROPRANOLOL HCL 20 MG PO TABS: 20.0000 mg | ORAL_TABLET | Freq: Three times a day (TID) | ORAL | Status: DC

## 2012-03-05 NOTE — Progress Notes (Signed)
  Subjective:    Patient ID: Zachary Stone, male    DOB: Apr 10, 1956, 56 y.o.   MRN: [id number]  HPI Pt describes constant tremors in entire body since age 23 or 5. Tremor is most noticeable in arms and hands. Fatigue and repeated motions make tremors worse. Excitement and lifting equipment makes it worse as well. Pt feels as though "sugar is low" when shakes becomes worse; pt doesnot check sugar at home.Drinking juice can help sometimes. Pt says the tremors have become worse recently and he is struggling to play his guitar. Pt states when he was a teenager a doctor told him he probably suffered a concussion at one time and the tremors will just get worse over time. He did sustain head injury on sled age 30 or 5 w/o LOC. No family history of Parkinson's disease. Some diabetes in family history among older relatives ( MGGM). He has had  pressure differential issues in his ears for 2 and 1/2; Dr. Jearld Fenton, ENT is to place a tube    Review of Systems Cardiac :PMH of  "arrhythmia" Neurologic : no  headache, numbness and tingling, weakness. Some change in coordination. He denies incontinence of urine or stool (gait/falling); he misses step going up stairs & knocks things over reaching for them Syncope:no Seizure activity:no Visual: no  change (blurred/double/loss) Hearing: severe tinnitus; some hearing loss with pressure issues R ear Nausea/sweating:no        Objective:   Physical Exam  Gen. appearance: Well-nourished, in no distress Eyes: Extraocular motion intact, field of vision normal, vision grossly intact. Unsustained horizontal and vertical nystagmus ENT: Canals essentially  clear, tympanic membranes normal. Some wax on L.Marland KitchenHearing grossly normal. Tuning fork exam is normal except he states with bone conduction he heard better in the right ear than the left. There was no lateralization with tuning fork but reported. Neck: Normal range of motion, no masses, normal thyroid Cardiovascular: Rate and  rhythm normal; no murmurs, gallops . S 4 Muscle skeletal: Range of motion, tone, &  strength normal Neuro:no cranial nerve deficit, deep tendon  reflexes normal, gait normal except there is decreased arm swing. Romberg finger to nose testing is normal except for some tremor of his hands. He has bilateral fine tremor with hands extended. Lymph: No cervical or axillary LA Skin: Warm and dry without suspicious lesions or rashes Psych: no anxiety or mood change. Normally interactive and cooperative.         Assessment & Plan:  #1 tremor, constant for 50 years but slightly worse recently. This is in the context of history of possible concussion at age 78 or 4. The tremor appears to be worse with intentional  activity.  #2 balance dysfunction related to inner ear issues. Otic tube placement plan. Mildly abnormal tuning fork exam with lateralization to the opposite side with bone conduction testing.  Plan: I'll ask him to share this report with Dr. Jearld Fenton. Imaging to include CAT scan may be indicated. Nonselective beta-blockade may be of benefit to control the tremor. Because of the atypical history; neurology evaluation indicated.

## 2012-03-05 NOTE — Patient Instructions (Signed)
Please do not use Q-tips as we discussed. Should wax build up occur, please put 2-3 drops of mineral oil in the ear at night and cover the canal with a  cotton ball. In the morning fill the canal with hydrogen peroxide & leave  for 10-15 minutes. Following this shower and use the thinnest washrag available to wick out the wax.  

## 2012-03-06 ENCOUNTER — Encounter: Payer: Self-pay | Admitting: Neurology

## 2012-03-09 ENCOUNTER — Other Ambulatory Visit (INDEPENDENT_AMBULATORY_CARE_PROVIDER_SITE_OTHER): Payer: BC Managed Care – PPO

## 2012-03-09 DIAGNOSIS — E785 Hyperlipidemia, unspecified: Secondary | ICD-10-CM

## 2012-03-09 DIAGNOSIS — I2510 Atherosclerotic heart disease of native coronary artery without angina pectoris: Secondary | ICD-10-CM

## 2012-03-09 DIAGNOSIS — I10 Essential (primary) hypertension: Secondary | ICD-10-CM

## 2012-03-09 LAB — HEPATIC FUNCTION PANEL
ALT: 30 U/L (ref 0–53)
AST: 25 U/L (ref 0–37)
Albumin: 4.2 g/dL (ref 3.5–5.2)
Alkaline Phosphatase: 52 U/L (ref 39–117)
Total Protein: 7.2 g/dL (ref 6.0–8.3)

## 2012-03-09 LAB — BASIC METABOLIC PANEL
BUN: 19 mg/dL (ref 6–23)
CO2: 28 mEq/L (ref 19–32)
Chloride: 104 mEq/L (ref 96–112)
Glucose, Bld: 92 mg/dL (ref 70–99)
Potassium: 4.1 mEq/L (ref 3.5–5.1)
Sodium: 138 mEq/L (ref 135–145)

## 2012-03-09 LAB — LIPID PANEL: Cholesterol: 87 mg/dL (ref 0–200)

## 2012-03-10 ENCOUNTER — Encounter: Payer: Self-pay | Admitting: Cardiology

## 2012-03-10 ENCOUNTER — Ambulatory Visit (INDEPENDENT_AMBULATORY_CARE_PROVIDER_SITE_OTHER): Payer: BC Managed Care – PPO | Admitting: Cardiology

## 2012-03-10 VITALS — BP 118/78 | HR 46 | Ht 64.75 in | Wt 192.4 lb

## 2012-03-10 DIAGNOSIS — E785 Hyperlipidemia, unspecified: Secondary | ICD-10-CM

## 2012-03-10 DIAGNOSIS — I10 Essential (primary) hypertension: Secondary | ICD-10-CM

## 2012-03-10 DIAGNOSIS — I2510 Atherosclerotic heart disease of native coronary artery without angina pectoris: Secondary | ICD-10-CM

## 2012-03-10 NOTE — Assessment & Plan Note (Signed)
Controlled.  

## 2012-03-10 NOTE — Assessment & Plan Note (Addendum)
Continues to remain stable.  Has first generation DES in the LAD associated with multiple non DES.  Therefore, continue as present.

## 2012-03-10 NOTE — Assessment & Plan Note (Signed)
At target.  Went on a new diet, and LDL is in the 30s.

## 2012-03-10 NOTE — Patient Instructions (Signed)
Your physician wants you to follow-up in: 6-8 MONTHS.  You will receive a reminder letter in the mail two months in advance. If you don't receive a letter, please call our office to schedule the follow-up appointment.  Your physician recommends that you continue on your current medications as directed. Please refer to the Current Medication list given to you today.

## 2012-03-10 NOTE — Progress Notes (Signed)
   HPI:  Doing well.  No major issues.  Feels good.  Started a new band.  Does have a tremor which he has had since childhood, but slightly worse now.  No chest pain.  We discussed his DAPT and reasons for it.    No progression of symptoms.  Lipid reviewed.    Current Outpatient Prescriptions  Medication Sig Dispense Refill  . aspirin 81 MG tablet Take 81 mg by mouth daily.        . clonazePAM (KLONOPIN) 0.5 MG tablet Take 1 tablet (0.5 mg total) by mouth at bedtime as needed.  90 tablet  0  . clopidogrel (PLAVIX) 75 MG tablet Take 1 tablet (75 mg total) by mouth daily.  90 tablet  3  . ezetimibe-simvastatin (VYTORIN) 10-20 MG per tablet Take 1 tablet by mouth at bedtime.  90 tablet  3  . Multiple Vitamin (MULTIVITAMIN) tablet Take 1 tablet by mouth daily.        . valsartan (DIOVAN) 80 MG tablet Take 1 tablet (80 mg total) by mouth daily.  90 tablet  3    Allergies  Allergen Reactions  . Clams (Shellfish Allergy)   . Iodine     Patient has never had CT contrast to know if he is allergic to Iodine.  Only allergic to Clams and Morphine.  JWM 05/06/11:  Had Ct contrast today and did not have a reaction.  . Morphine     Past Medical History  Diagnosis Date  . Diverticulosis     on CT scan 2009  . Diverticulitis     on CT 2012    Past Surgical History  Procedure Date  . Cholecystectomy   . Coronary angioplasty with stent placement     Dr.Munir Victorian  . Tonsillectomy   . Colonoscopy 2007    Negative recheck 2017  . Vasectomy     Family History  Problem Relation Age of Onset  . Coronary artery disease Mother   . Skin cancer Maternal Uncle   . Diabetes      MGGM    History   Social History  . Marital Status: Married    Spouse Name: N/A    Number of Children: N/A  . Years of Education: N/A   Occupational History  . Not on file.   Social History Main Topics  . Smoking status: Never Smoker   . Smokeless tobacco: Not on file  . Alcohol Use: Yes     rarely , 2-3 glasses  of wine / month  . Drug Use: No  . Sexually Active: Not on file   Other Topics Concern  . Not on file   Social History Narrative  . No narrative on file    ROS: Please see the HPI.  All other systems reviewed and negative.  PHYSICAL EXAM:  BP 118/78  Pulse 46  Ht 5' 4.75" (1.645 m)  Wt 192 lb 6.4 oz (87.272 kg)  BMI 32.26 kg/m2  General: Well developed, well nourished, in no acute distress. Head:  Normocephalic and atraumatic. Neck: no JVD Lungs: Clear to auscultation and percussion. Heart: Normal S1 and S2.  No murmur, rubs or gallops.  Extremities: No clubbing or cyanosis. No edema. Neurologic: Alert and oriented x 3.  EKG: Marked sinus bradycardia.    Anterior MI, old   ASSESSMENT AND PLAN:

## 2012-03-16 ENCOUNTER — Telehealth: Payer: Self-pay | Admitting: Cardiology

## 2012-03-16 NOTE — Telephone Encounter (Signed)
Lab results were given to pt. 

## 2012-03-16 NOTE — Telephone Encounter (Signed)
Fu call °Pt was returning your call °

## 2012-04-20 ENCOUNTER — Other Ambulatory Visit: Payer: Self-pay | Admitting: *Deleted

## 2012-04-20 NOTE — Telephone Encounter (Signed)
OK X1 

## 2012-04-20 NOTE — Telephone Encounter (Signed)
Last OV 03-10-12, last filled 12-04-11#90

## 2012-04-21 MED ORDER — CLONAZEPAM 0.5 MG PO TABS: 0.5000 mg | ORAL_TABLET | Freq: Every evening | ORAL | Status: DC | PRN

## 2012-04-21 NOTE — Telephone Encounter (Signed)
Rx sent 

## 2012-05-01 ENCOUNTER — Telehealth: Payer: Self-pay | Admitting: Cardiology

## 2012-05-01 NOTE — Telephone Encounter (Signed)
New msg [facility] calling about vytorin rx. Ref number [id number] please call back

## 2012-05-01 NOTE — Telephone Encounter (Signed)
[facility] called to find out if Dr Riley Kill would be willing to change Zachary Stone to Lovastatin, Pravastatin, Simvastatin or Atorvastatin.  They state the Vytorin is a covered medication but the others would save the pt quite a bit of money.

## 2012-05-04 NOTE — Telephone Encounter (Signed)
Left message for pt to call back.  It is up to the patient if he is interested in trying a different statin drug.

## 2012-05-04 NOTE — Telephone Encounter (Signed)
I spoke with the pt and he does not want to switch his cholesterol medication.  The pt did not prompt this question with [facility].  I spoke with [facility] and made them aware that the pt will remain on Vytorin.

## 2012-05-04 NOTE — Telephone Encounter (Signed)
F/U on previous call:  Returning call back to nurse Lauren.

## 2012-05-12 ENCOUNTER — Encounter: Payer: Self-pay | Admitting: Neurology

## 2012-05-12 ENCOUNTER — Ambulatory Visit (INDEPENDENT_AMBULATORY_CARE_PROVIDER_SITE_OTHER): Payer: BC Managed Care – PPO | Admitting: Neurology

## 2012-05-12 VITALS — BP 106/70 | HR 48 | Wt 173.0 lb

## 2012-05-12 DIAGNOSIS — R259 Unspecified abnormal involuntary movements: Secondary | ICD-10-CM

## 2012-05-12 MED ORDER — PRIMIDONE 50 MG PO TABS: ORAL_TABLET | ORAL | Status: DC

## 2012-05-12 NOTE — Progress Notes (Signed)
Dear Dr. Alwyn Ren,  Thank you for having me see Zachary Stone in consultation today at [facility] for his problem with essential tremor.  As you may recall, he is a 56 y.o. year old male with a history of tremor in his hands since he was a child.  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  He finds his dexterity is slightly worse with worsening of his hand writing.  The tremor sometimes involves his legs.  Does not drink enough alcohol to know if it helps.  Uses clonazepam for sleep, but has not noted effect on tremor and does not like to use as it gives him a headache.  You started him on propranolol and he is taking 10mg  bid and thinks it helps the tremor slightly.  However, it also makes him feel lightheaded at times.  He has chronically low HR in the 50s.  No change in gait.  He has had problems with the feeling of fluid in his right ear for which he has gotten tubes.  No headaches.  He has had chronic tinnitus for years.  Sometimes he feels that his thinking is not as good.   Past Medical History  Diagnosis Date  . Diverticulosis     on CT scan 2009  . Diverticulitis     on CT 2012    Past Surgical History  Procedure Date  . Cholecystectomy   . Coronary angioplasty with stent placement     Dr.Stuckey  . Tonsillectomy   . Colonoscopy 2007    Negative recheck 2017  . Vasectomy     History   Social History  . Marital Status: Married    Spouse Name: N/A    Number of Children: N/A  . Years of Education: N/A   Social History Main Topics  . Smoking status: Never Smoker   . Smokeless tobacco: Never Used  . Alcohol Use: Yes     2-3 glasses of wine / month  . Drug Use: No  . Sexually Active: None   Other Topics Concern  . None   Social History Narrative  . None  - no significant caffeine use.  Family History  Problem Relation Age of Onset  . Coronary artery disease Mother     . Skin cancer Maternal Uncle   . Diabetes      MGGM  - no family history of tremor  Current Outpatient Prescriptions on File Prior to Visit  Medication Sig Dispense Refill  . aspirin 81 MG tablet Take 81 mg by mouth daily.        . clonazePAM (KLONOPIN) 0.5 MG tablet Take 1 tablet (0.5 mg total) by mouth at bedtime as needed.  90 tablet  0  . clopidogrel (PLAVIX) 75 MG tablet Take 1 tablet (75 mg total) by mouth daily.  90 tablet  3  . ezetimibe-simvastatin (VYTORIN) 10-20 MG per tablet Take 1 tablet by mouth at bedtime.  90 tablet  3  . Multiple Vitamin (MULTIVITAMIN) tablet Take 1 tablet by mouth daily.        . valsartan (DIOVAN) 80 MG tablet Take 1 tablet (80 mg total) by mouth daily.  90 tablet  3  . primidone (MYSOLINE) 50 MG tablet increase to 4 tablets at night as directed.  120 tablet  4    Allergies  Allergen Reactions  . Clams (Shellfish Allergy)   . Iodine  Patient has never had CT contrast to know if he is allergic to Iodine.  Only allergic to Clams and Morphine.  JWM 05/06/11:  Had Ct contrast today and did not have a reaction.  . Morphine       ROS:  13 systems were reviewed and are notable for some new back pain.  All other review of systems are unremarkable.   Examination:  Filed Vitals:   05/12/12 0822  BP: 106/70  Pulse: 48  Weight: 173 lb (78.472 kg)     In general, well appearing man.  Cardiovascular: The patient has a regular rate and rhythm and no carotid bruits.  Fundoscopy:  Disks are flat. Vessel caliber within normal limits.  Mental status:   The patient is oriented to person, place and time. Recent and remote memory are intact. Attention span and concentration are normal. Language including repetition, naming, following commands are intact. Fund of knowledge of current and historical events, as well as vocabulary are normal.  Cranial Nerves: Pupils are equally round and reactive to light. Visual fields full to confrontation.  Extraocular movements are intact with mild bilateral end gaze nystagmus. Facial sensation and muscles of mastication are intact. Muscles of facial expression are symmetric. Hearing intact to bilateral finger rub. Tongue protrusion, uvula, palate midline.  Shoulder shrug intact  Motor:  The patient has normal bulk and tone, no pronator drift.  Mild mainly postural tremor, worse on the left.  5/5 muscle strength bilaterally.  Reflexes:   Biceps  Triceps Brachioradialis Knee Ankle  Right 2+  2+  2+   2+ 2+  Left  2+  2+  2+   2+ 2+  Toes down  Coordination:  Normal finger to nose.  No dysdiadokinesia.  Sensation is light touch.  Gait and Station are normal.  Tandem gait is intact.  Romberg is negative  Note he has normal baseline LFTs done recently.  Impression/Recs: 1.  Essential tremor - I think he is going to be limited by low HR in the use of propranolol.  I am going to try primidone 50mg  hs-> 200mg  hs.  He can stop the titration however at any time if he feels his tremor is adequately controlled.  He can stop the propranolol for now.      We will see the patient back in 2 months.  Thank you for having [place] see Zachary Stone in consultation.  Feel free to contact me with any questions.  Lupita Raider Modesto Charon, MD [facility], [place] [address] [place], [place] [address] Phone: [phone] Fax: [phone].

## 2012-05-12 NOTE — Patient Instructions (Signed)
start with 1 tab of primidone(50mg ) at night for 2 weeks, then increase to 2 tabs of primidone at night for 2 weeks, then increase to 3 tabs of primidone at night for 2 weeks, then increase to 4 tabs of primidone at night from then on.

## 2012-06-02 ENCOUNTER — Other Ambulatory Visit: Payer: Self-pay | Admitting: Internal Medicine

## 2012-06-30 ENCOUNTER — Telehealth: Payer: Self-pay | Admitting: Cardiology

## 2012-07-08 ENCOUNTER — Ambulatory Visit: Payer: BC Managed Care – PPO | Admitting: Neurology

## 2012-07-14 ENCOUNTER — Telehealth: Payer: Self-pay | Admitting: Internal Medicine

## 2012-07-14 MED ORDER — CLONAZEPAM 0.5 MG PO TABS: 0.5000 mg | ORAL_TABLET | Freq: Every evening | ORAL | Status: DC | PRN

## 2012-07-14 NOTE — Telephone Encounter (Signed)
Last filled 04/21/12 #90, last OV 03/05/12. Dr.Hopper please advise

## 2012-07-14 NOTE — Telephone Encounter (Signed)
RX Called in .

## 2012-07-14 NOTE — Telephone Encounter (Signed)
OK # 90 

## 2012-07-14 NOTE — Telephone Encounter (Signed)
Refill: Clonazepam 0.5mg  tab. Last fill 04-21-12

## 2012-08-17 ENCOUNTER — Encounter: Payer: Self-pay | Admitting: Cardiology

## 2012-08-17 NOTE — Telephone Encounter (Signed)
New Problem:    Called because they would like to offer the the alternatives of  lovastatin, pravastatin, simvastatin, atrovastatin for the patient's ezetimibe-simvastatin (VYTORIN) 10-20 MG per tablet to try and save them money.

## 2012-08-17 NOTE — Telephone Encounter (Signed)
This encounter was created in error - please disregard.

## 2012-08-24 ENCOUNTER — Other Ambulatory Visit: Payer: Self-pay | Admitting: Cardiology

## 2012-08-25 MED ORDER — CLOPIDOGREL BISULFATE 75 MG PO TABS: 75.0000 mg | ORAL_TABLET | Freq: Every day | ORAL | Status: DC

## 2012-11-05 ENCOUNTER — Telehealth: Payer: Self-pay | Admitting: Cardiology

## 2012-11-05 NOTE — Telephone Encounter (Signed)
Pt needs refill on plavix generic, diovan, vytorin 10/20 [facility] please call patient when this is done

## 2012-11-06 MED ORDER — VALSARTAN 80 MG PO TABS: 80.0000 mg | ORAL_TABLET | Freq: Every day | ORAL | Status: DC

## 2012-11-06 MED ORDER — CLOPIDOGREL BISULFATE 75 MG PO TABS: 75.0000 mg | ORAL_TABLET | Freq: Every day | ORAL | Status: DC

## 2012-11-06 MED ORDER — EZETIMIBE-SIMVASTATIN 10-20 MG PO TABS: 1.0000 | ORAL_TABLET | Freq: Every day | ORAL | Status: DC

## 2012-11-06 NOTE — Telephone Encounter (Signed)
RX sent into pharmacy. LMOM for pt.

## 2012-11-13 ENCOUNTER — Other Ambulatory Visit: Payer: Self-pay | Admitting: Cardiology

## 2012-11-13 MED ORDER — VALSARTAN 80 MG PO TABS: 80.0000 mg | ORAL_TABLET | Freq: Every day | ORAL | Status: DC

## 2012-11-13 MED ORDER — EZETIMIBE-SIMVASTATIN 10-20 MG PO TABS: 1.0000 | ORAL_TABLET | Freq: Every day | ORAL | Status: DC

## 2012-11-13 MED ORDER — CLOPIDOGREL BISULFATE 75 MG PO TABS: 75.0000 mg | ORAL_TABLET | Freq: Every day | ORAL | Status: DC

## 2012-11-13 NOTE — Telephone Encounter (Signed)
Rx sent to [facility] per pt request.  N/A at pt number.  Also left message for him regarding refill of meds and that he is due for an appt with Dr Riley Kill.

## 2012-11-13 NOTE — Telephone Encounter (Signed)
New Problem:    Patient called in because [facility] claims that they never received the orders for his medications clopidogrel (PLAVIX) 75 MG tablet, ezetimibe-simvastatin (VYTORIN) 10-20 MG per tablet, and valsartan (DIOVAN) 80 MG tablet [phone] option 2.  Patient would like another call to confirm.  Please call back.

## 2012-12-09 ENCOUNTER — Ambulatory Visit (INDEPENDENT_AMBULATORY_CARE_PROVIDER_SITE_OTHER): Payer: BC Managed Care – PPO | Admitting: Cardiology

## 2012-12-09 ENCOUNTER — Encounter: Payer: Self-pay | Admitting: Cardiology

## 2012-12-09 VITALS — BP 110/68 | HR 53 | Ht 64.0 in | Wt 173.0 lb

## 2012-12-09 DIAGNOSIS — E785 Hyperlipidemia, unspecified: Secondary | ICD-10-CM

## 2012-12-09 DIAGNOSIS — I2510 Atherosclerotic heart disease of native coronary artery without angina pectoris: Secondary | ICD-10-CM

## 2012-12-09 NOTE — Assessment & Plan Note (Signed)
Remains stable.  Has DES in both the LAD and RCA with diffuse disease.  LAD stent was placed overlapping diffuse disease in previously placed BMS.  Both are first generation devices.  We have reviewed antiplatelet strategy.  No bleeding issues. Will continue on long term DAPT for now in absence of bleeding due to nature of stent implants, particularly the LAD.  RCA should be stable.

## 2012-12-09 NOTE — Patient Instructions (Addendum)
Your physician wants you to follow-up in:  6 months with Dr. Excell Seltzer. You will receive a reminder letter in the mail two months in advance. If you don't receive a letter, please call our office to schedule the follow-up appointment.  Your physician recommends that you return for fasting lab work in:  Late February.  The lab opens at 7:30 every week day

## 2012-12-09 NOTE — Assessment & Plan Note (Signed)
Recheck lipid and liver.

## 2012-12-09 NOTE — Progress Notes (Signed)
HPI:  The patient returns in followup today. We have fairly lengthy discussion about his continued use of dual antiplatelet therapy.  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  Seems to get along quite well.  No frequent chest pain.  Also discussed use of Vytorin--and pending results of the IMPROVE IT study.    Current Outpatient Prescriptions  Medication Sig Dispense Refill  . aspirin 81 MG tablet Take 81 mg by mouth daily.        . clonazePAM (KLONOPIN) 0.5 MG tablet Take 1 tablet (0.5 mg total) by mouth at bedtime as needed.  90 tablet  0  . clopidogrel (PLAVIX) 75 MG tablet Take 1 tablet (75 mg total) by mouth daily.  90 tablet  0  . ezetimibe-simvastatin (VYTORIN) 10-20 MG per tablet Take 1 tablet by mouth at bedtime.  90 tablet  0  . Multiple Vitamin (MULTIVITAMIN) tablet Take 1 tablet by mouth daily.        . primidone (MYSOLINE) 50 MG tablet increase to 4 tablets at night as directed.  120 tablet  4  . propranolol (INDERAL) 20 MG tablet TAKE ONE TABLET BY MOUTH THREE TIMES DAILY  60 tablet  5  . valsartan (DIOVAN) 80 MG tablet Take 1 tablet (80 mg total) by mouth daily.  90 tablet  0    Allergies  Allergen Reactions  . Clams (Shellfish Allergy)   . Iodine     Patient has never had CT contrast to know if he is allergic to Iodine.  Only allergic to Clams and Morphine.  JWM 05/06/11:  Had Ct contrast today and did not have a reaction.  . Morphine     Past Medical History  Diagnosis Date  . Diverticulosis     on CT scan 2009  . Diverticulitis     on CT 2012    Past Surgical History  Procedure Date  . Cholecystectomy   . Coronary angioplasty with stent placement     Dr.Myrl Lazarus  . Tonsillectomy   . Colonoscopy 2007    Negative recheck 2017  . Vasectomy     Family History  Problem Relation Age of Onset  . Coronary artery disease Mother   . Skin cancer Maternal Uncle   . Diabetes      MGGM    History   Social History  . Marital Status: Married    Spouse  Name: N/A    Number of Children: N/A  . Years of Education: N/A   Occupational History  . Not on file.   Social History Main Topics  . Smoking status: Never Smoker   . Smokeless tobacco: Never Used  . Alcohol Use: Yes     Comment: 2-3 glasses of wine / month  . Drug Use: No  . Sexually Active: Not on file   Other Topics Concern  . Not on file   Social History Narrative  . No narrative on file    ROS: Please see the HPI.  All other systems reviewed and negative.  PHYSICAL EXAM:  BP 110/68  Pulse 53  Ht 5\' 4"  (1.626 m)  Wt 173 lb (78.472 kg)  BMI 29.70 kg/m2  SpO2 97%  General: Well developed, well nourished, in no acute distress. Head:  Normocephalic and atraumatic. Neck: no JVD Lungs: Clear to auscultation and percussion. Heart: Normal S1 and S2.  No murmur, rubs or gallops. Ocassional ectopy Abdomen:  Normal bowel sounds; soft; non tender; no organomegaly Pulses: Pulses normal in  all 4 extremities. Extremities: No clubbing or cyanosis. No edema. Neurologic: Alert and oriented x 3.  EKG:  NSR. Anterior MI, indeterminate age.  Occasional PVCs.    ASSESSMENT AND PLAN:

## 2013-01-05 ENCOUNTER — Other Ambulatory Visit: Payer: BC Managed Care – PPO

## 2013-01-21 ENCOUNTER — Telehealth: Payer: Self-pay | Admitting: Internal Medicine

## 2013-01-21 MED ORDER — CLONAZEPAM 0.5 MG PO TABS: 0.5000 mg | ORAL_TABLET | Freq: Every evening | ORAL | Status: DC | PRN

## 2013-01-21 NOTE — Telephone Encounter (Signed)
Patient aware Controlled Substance Contract to be sign and rx to be picked up   

## 2013-01-21 NOTE — Telephone Encounter (Signed)
Left message on VM informing patient he will need to stop by the office to sign a contract prior to receiving rx. Patient is also due for an OV, patient to schedule when he picks up rx/signs contract

## 2013-01-21 NOTE — Telephone Encounter (Signed)
refill  ClonazePAM (Tab) 0.5 MG Take 1 tablet (0.5 mg total) by mouth at bedtime as needed. last fill 8.27.13

## 2013-02-04 ENCOUNTER — Other Ambulatory Visit: Payer: Self-pay | Admitting: *Deleted

## 2013-02-04 MED ORDER — VALSARTAN 80 MG PO TABS: 80.0000 mg | ORAL_TABLET | Freq: Every day | ORAL | Status: DC

## 2013-02-04 MED ORDER — EZETIMIBE-SIMVASTATIN 10-20 MG PO TABS: 1.0000 | ORAL_TABLET | Freq: Every day | ORAL | Status: DC

## 2013-02-04 MED ORDER — CLOPIDOGREL BISULFATE 75 MG PO TABS: 75.0000 mg | ORAL_TABLET | Freq: Every day | ORAL | Status: DC

## 2013-02-04 NOTE — Telephone Encounter (Signed)
Fax Received. Refill Completed. Zachary Stone (R.M.A)   

## 2013-02-22 ENCOUNTER — Encounter: Payer: Self-pay | Admitting: Internal Medicine

## 2013-03-25 ENCOUNTER — Encounter: Payer: Self-pay | Admitting: Lab

## 2013-03-26 ENCOUNTER — Encounter: Payer: Self-pay | Admitting: Internal Medicine

## 2013-03-26 ENCOUNTER — Ambulatory Visit (INDEPENDENT_AMBULATORY_CARE_PROVIDER_SITE_OTHER): Payer: BC Managed Care – PPO | Admitting: Internal Medicine

## 2013-03-26 VITALS — BP 116/70 | HR 46 | Temp 98.2°F | Wt 175.0 lb

## 2013-03-26 DIAGNOSIS — H9190 Unspecified hearing loss, unspecified ear: Secondary | ICD-10-CM

## 2013-03-26 DIAGNOSIS — H9193 Unspecified hearing loss, bilateral: Secondary | ICD-10-CM | POA: Insufficient documentation

## 2013-03-26 NOTE — Progress Notes (Signed)
  Subjective:    Patient ID: Zachary Stone, male    DOB: [birth year], 57 y.o.   MRN: [id number]  HPI  He's had decreased hearing for several months superimposed on chronic tinnitus. 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 Chiefly this is his inability to hear harmonies during the recordings in addition to day-to-day hearing deficits.  Dr. Jearld Fenton placed an otic tube in 2013 for Eustachian tube dysfunction described as sensation of water in his ears. He does not believe that the otic tube made a significant difference. Audiology testing @ that time revealed significant hearing loss beyond will be expected for age related issues.  Review of Systems  He denies fever, chills, sweats, frontal headaches, nasal purulence, facial pain, dental pain, sore throat, or otic discharge.      Objective:   Physical Exam  He appears healthy and well-nourished in no distress  There is no lymphadenopathy about the neck or axilla  Following soaking and gavage both canals are clear with no exudates or other lesions. There is mild barotrauma related changes in the right ear.  A small otic tube was lavaged out. I cannot see a perforation in the right tympanic membrane.  He has decreased hearing to whisper bilaterally          Assessment & Plan:  #1 hearing loss acute on chronic with associated tinnitus.  #2 status post otic tube placement for eustachian tube symptoms with minimal subjective response.  Plan: A cerumen impaction regimen was discussed.  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

## 2013-03-26 NOTE — Patient Instructions (Addendum)
Please do not use Q-tips as we discussed. Should wax build up occur, please put 2-3 drops of mineral oil in the affected  ear at night to soften the wax .Cover the canal with a  cotton ball to prevent the oil from staining bed linens. In the morning fill the ear canal with hydrogen peroxide & lie in the opposite lateral decubitus position(on the side opposite the affected ear)  for 10-15 minutes. After allowing this period of time for the peroxide to dissolve the wax ;shower and use the thinnest washrag available to wick out the wax. If both ears are involved ; alternate this treatment from ear to ear each night until no wax is found on the washrag. 

## 2013-03-31 ENCOUNTER — Other Ambulatory Visit: Payer: Self-pay | Admitting: General Practice

## 2013-03-31 MED ORDER — ACYCLOVIR 800 MG PO TABS: 800.0000 mg | ORAL_TABLET | Freq: Two times a day (BID) | ORAL | Status: AC

## 2013-05-11 ENCOUNTER — Telehealth: Payer: Self-pay | Admitting: Internal Medicine

## 2013-05-11 NOTE — Telephone Encounter (Signed)
Patient states that at his visit on 03/26/2013 Dr. Alwyn Ren recommended to him a hearing specialist. Patient says he has lost the name and phone number but wants to know if Dr. Alwyn Ren can advise him again on who he recommends.

## 2013-05-11 NOTE — Telephone Encounter (Signed)
Audiologist @ [facility] on [address] across from [address](close to [employer])

## 2013-05-12 NOTE — Telephone Encounter (Signed)
LM @ (10:59am) asking the pt to RTC regarding name of Audiologist.//AB/CMA

## 2013-05-12 NOTE — Telephone Encounter (Signed)
Left Pt detail VM of physician name.Pt to return with any additional concerns

## 2013-05-12 NOTE — Telephone Encounter (Signed)
Sorry; Dr Ermalinda Barrios, [place]

## 2013-05-12 NOTE — Telephone Encounter (Signed)
Patient returned Angie's call. He states that is not the right doctor. He believes it was in [employer].

## 2013-06-01 ENCOUNTER — Other Ambulatory Visit: Payer: Self-pay | Admitting: Internal Medicine

## 2013-07-09 ENCOUNTER — Telehealth: Payer: Self-pay | Admitting: Cardiology

## 2013-07-09 NOTE — Telephone Encounter (Signed)
Follow up   Pt following up on previous message.

## 2013-07-09 NOTE — Telephone Encounter (Signed)
New problem   Pt need to speak to nurse concerning the was told you had a irregular heart rate started at 30 and it was just 40. Bp was 90/68 which was today. 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 Please call pt

## 2013-07-09 NOTE — Telephone Encounter (Signed)
I spoke with the pt and he had his annual wellness physical at work today.  The pt's BP was low 90/68 and pulse was in the 30-40 range.  The pt denies dizziness but does complain of fatigue. I reviewed the pt's medication list and he is not taking propranolol. The pt is not on any rate control drugs. The pt has not been monitoring his BP and pulse at home and he should be receiving a new BP cuff next Tuesday. I advised the pt that he can decrease his diovan to 40mg  daily if his BP is low. I have scheduled the pt to come into the office on 07/12/13 to see Tereso Newcomer PA-C.

## 2013-07-12 ENCOUNTER — Ambulatory Visit (INDEPENDENT_AMBULATORY_CARE_PROVIDER_SITE_OTHER): Payer: BC Managed Care – PPO | Admitting: Physician Assistant

## 2013-07-12 ENCOUNTER — Encounter: Payer: Self-pay | Admitting: Physician Assistant

## 2013-07-12 VITALS — BP 96/66 | HR 63 | Ht 64.0 in | Wt 175.0 lb

## 2013-07-12 DIAGNOSIS — I10 Essential (primary) hypertension: Secondary | ICD-10-CM

## 2013-07-12 DIAGNOSIS — R0683 Snoring: Secondary | ICD-10-CM

## 2013-07-12 DIAGNOSIS — R5383 Other fatigue: Secondary | ICD-10-CM

## 2013-07-12 DIAGNOSIS — E785 Hyperlipidemia, unspecified: Secondary | ICD-10-CM

## 2013-07-12 DIAGNOSIS — I2510 Atherosclerotic heart disease of native coronary artery without angina pectoris: Secondary | ICD-10-CM

## 2013-07-12 DIAGNOSIS — R5381 Other malaise: Secondary | ICD-10-CM

## 2013-07-12 DIAGNOSIS — R0609 Other forms of dyspnea: Secondary | ICD-10-CM

## 2013-07-12 DIAGNOSIS — R079 Chest pain, unspecified: Secondary | ICD-10-CM

## 2013-07-12 LAB — CBC WITH DIFFERENTIAL/PLATELET
Basophils Absolute: 0 10*3/uL (ref 0.0–0.1)
Eosinophils Absolute: 0.1 10*3/uL (ref 0.0–0.7)
HCT: 45.3 % (ref 39.0–52.0)
Hemoglobin: 15.5 g/dL (ref 13.0–17.0)
Lymphs Abs: 2.7 10*3/uL (ref 0.7–4.0)
MCHC: 34.3 g/dL (ref 30.0–36.0)
Neutro Abs: 3.9 10*3/uL (ref 1.4–7.7)
RDW: 12.8 % (ref 11.5–14.6)

## 2013-07-12 LAB — BASIC METABOLIC PANEL
CO2: 28 mEq/L (ref 19–32)
Chloride: 102 mEq/L (ref 96–112)
Glucose, Bld: 87 mg/dL (ref 70–99)
Potassium: 3.8 mEq/L (ref 3.5–5.1)
Sodium: 136 mEq/L (ref 135–145)

## 2013-07-12 NOTE — Progress Notes (Signed)
[address], [address] [place], [place]  [address] Phone: [phone] Fax:  [phone]  Date:  07/12/2013   ID:  Zachary Stone, DOB November 25, 1955, MRN [id number]  PCP:  Marga Melnick, MD  Cardiologist:  Dr.  Shawnie Pons => Dr. [name]   History of Present Illness: Zachary Stone is a 57 y.o. male who returns for evaluation of her bradycardia and hypotension.  He has a h/o CAD, s/p anterior MI in 2003 treated with a BMS to the LAD x2. He subsequently underwent DES placement to the RCA as well the LAD in January 2006.  Echo 9/03: Apical AK, EF 60%.  Last LHC 11/2010:  dLM 20, prox to mid LAD stents with mod ISR (40-50%), oD2 jailed (99% - non flow limiting), mCFX 30, OM2 30, pRCA stent ok with 20% ISR, EF 50-55%.  Myoview 11/2010: exercised 10 mins, anteroapical MI, no ischemia, EF 51%, low risk.  Last seen by Dr. Riley Kill 11/2012. He was doing well. Patient has a DES in the LAD and RCA (both first generation DES). Decision was made to continue dual antiplatelet therapy.  He recently had a low blood pressure (90/68) and low heart rate (30-40) noted at a wellness physical at work.  His checkup at work was a routine visit. After being told his blood pressure and heart rate, he did recall being fatigued over the past several months. He has not been exercising due to the heat of the summer. He does note decreased exercise tolerance. He does note increased dyspnea with strenuous activity. He has a fairly sedentary job. He is typically quite tired when he comes home. His wife has noted a history of snoring. He does note some soreness in his chest. This is a chronic symptom over the years. It comes and go without any aggravating factors. He denies exertional chest discomfort. He denies any symptoms reminiscent of his previous angina. He denies orthopnea, PND or edema. He denies syncope. He does have some orthostatic intolerance.  When he called in last week, he was told to decrease his  Diovan. He has been cutting the tablets in half. He feels that this may have helped his energy level somewhat.  Labs (11/11):  TSH 0.84 Labs (6/12):    Hgb 16.4 Labs (4/13):    K 4.1, Cr 1.0, ALT 30, LDL 38  Wt Readings from Last 3 Encounters:  03/26/13 175 lb (79.379 kg)  12/09/12 173 lb (78.472 kg)  05/12/12 173 lb (78.472 kg)     Past Medical History  Diagnosis Date  . Diverticulosis     on CT scan 2009  . Diverticulitis     on CT 2012  . Hyperlipidemia   . Coronary artery disease     s/p anterior MI in 2003 treated with a BMS to the LAD x2. He subsequently underwent DES placement to the RCA as well the LAD in January 2006.  Echo 9/03: Apical AK, EF 60%.  Last LHC 11/2010:  dLM 20, prox to mid LAD stents with mod ISR (40-50%), oD2 jailed (99% - non flow limiting), mCFX 30, OM2 30, pRCA stent ok with 20% ISR, EF 50-55%.  Myoview 11/2010: ant-apical MI, no isch, EF 51%, low risk  . Hypertension     Current Outpatient Prescriptions  Medication Sig Dispense Refill  . aspirin 81 MG tablet Take 81 mg by mouth daily.        . clonazePAM (KLONOPIN) 0.5 MG tablet TAKE ONE TABLET BY MOUTH  AT BEDTIME AS NEEDED  90 tablet  0  . clopidogrel (PLAVIX) 75 MG tablet Take 1 tablet (75 mg total) by mouth daily.  90 tablet  3  . ezetimibe-simvastatin (VYTORIN) 10-20 MG per tablet Take 1 tablet by mouth at bedtime.  90 tablet  3  . Multiple Vitamin (MULTIVITAMIN) tablet Take 1 tablet by mouth daily.        . valsartan (DIOVAN) 80 MG tablet Take 1 tablet (80 mg total) by mouth daily.  90 tablet  3   No current facility-administered medications for this visit.    Allergies:    Allergies  Allergen Reactions  . Clams [Shellfish Allergy]   . Iodine     Patient has never had CT contrast to know if he is allergic to Iodine.  Only allergic to Clams and Morphine.  JWM 05/06/11:  Had Ct contrast today and did not have a reaction.  . Morphine     Social History:  The patient  reports that he has never  smoked. He has never used smokeless tobacco. He reports that  drinks alcohol. He reports that he does not use illicit drugs.   ROS:  Please see the history of present illness.      All other systems reviewed and negative.   PHYSICAL EXAM: VS:  BP 100/69  Pulse 57  Ht 5\' 4"  (1.626 m)  Wt 175 lb (79.379 kg)  BMI 30.02 kg/m2  Filed Vitals:   07/12/13 1202 07/12/13 1203 07/12/13 1205 07/12/13 1208  BP: 100/68 103/69 93/69 96/66   Pulse: 64 60 66 63  Height:      Weight:         Well nourished, well developed, in no acute distress HEENT: normal Neck: no JVD Endocrine: No thyromegaly Cardiac:  normal S1, S2; RRR; no murmur Lungs:  clear to auscultation bilaterally, no wheezing, rhonchi or rales Abd: soft, nontender, no hepatomegaly Ext: no edema Skin: warm and dry Neuro:  CNs 2-12 intact, no focal abnormalities noted  EKG:  Sinus bradycardia, HR 57, anteroseptal Q waves, nonspecific ST-T wave changes, no change from prior tracing     ASSESSMENT AND PLAN:  1. Fatigue: Etiology not entirely clear. He did change to a low carb diet a couple of years ago. I suspect he may not need as much blood pressure medication as he once did. He also has a history of snoring and may indeed have sleep apnea. He has had some atypical chest symptoms. These do not seem to be escalating or reminiscent of his previous angina. However, with his history of CAD, I did suggest pursuing a stress test at this time. He prefers to hold off for now. We will obtain labs today: Basic metabolic panel, CBC, TSH. With his reported history of bradycardia with heart rates into the 30s, I have recommended pursuing Holter monitoring. He will wear a Holter monitor for 48 hours. I will also check orthostatic vital signs today. 2. CAD: As noted, he has had some atypical chest pains. We discussed pursuing stress testing at this time. He prefers to hold off. Continue aspirin, Plavix and statin. 3. Hypertension: Continue low-dose  Diovan for now. Check orthostatic vital signs today. We may need to take him off of ARB entirely if his pressures remain low. 4. Hyperlipidemia: Continue statin. 5. Snoring: Pursue workup as above. Consider arranging sleep study at followup. 6. Disposition:  F/u with Dr. [name] in 6-8 weeks.  I have encouraged him to return sooner if exercise  intolerance worsens so that we can pursue stress testing.   Signed, Tereso Newcomer, PA-C  07/12/2013 8:47 AM    Addendum 07/12/2013 5:34 PM: He did not have a significant drop in blood pressure from lying to standing. However, his blood pressure does tend to run quite low. I have asked him to stop the Diovan completely. We will see how he was doing in follow up. Consider adding a low dose of losartan in the future if his blood pressure will tolerate. Signed, Tereso Newcomer, PA-C   07/12/2013 5:34 PM

## 2013-07-12 NOTE — Patient Instructions (Addendum)
Will obtain labs today and call you with the results (bmet/cbc/tsh)  Your physician has recommended that you wear a holter monitor. Holter monitors are medical devices that record the heart's electrical activity. Doctors most often use these monitors to diagnose arrhythmias. Arrhythmias are problems with the speed or rhythm of the heartbeat. The monitor is a small, portable device. You can wear one while you do your normal daily activities. This is usually used to diagnose what is causing palpitations/syncope (passing out).  Your physician recommends that you schedule a follow-up appointment in: 6-8 weeks with Dr Excell Seltzer  HOLD YOUR DIOVAN UNTIL FOLLOW UP OFFICE VISIT

## 2013-07-13 ENCOUNTER — Telehealth: Payer: Self-pay | Admitting: Physician Assistant

## 2013-07-13 NOTE — Telephone Encounter (Signed)
Follow up  Pt states he is returning a call regarding his lab work.

## 2013-07-13 NOTE — Telephone Encounter (Signed)
Thank you Pat

## 2013-07-13 NOTE — Telephone Encounter (Signed)
Spoke with pt and reviewed lab results with him. 

## 2013-07-14 ENCOUNTER — Encounter (INDEPENDENT_AMBULATORY_CARE_PROVIDER_SITE_OTHER): Payer: BC Managed Care – PPO

## 2013-07-14 ENCOUNTER — Other Ambulatory Visit: Payer: Self-pay | Admitting: *Deleted

## 2013-07-14 ENCOUNTER — Encounter: Payer: Self-pay | Admitting: *Deleted

## 2013-07-14 DIAGNOSIS — R42 Dizziness and giddiness: Secondary | ICD-10-CM

## 2013-07-14 DIAGNOSIS — R5381 Other malaise: Secondary | ICD-10-CM

## 2013-07-14 DIAGNOSIS — I498 Other specified cardiac arrhythmias: Secondary | ICD-10-CM

## 2013-07-14 DIAGNOSIS — I495 Sick sinus syndrome: Secondary | ICD-10-CM

## 2013-07-14 NOTE — Progress Notes (Signed)
Patient ID: Zachary Stone, male   DOB: 03/06/1956, 57 y.o.   MRN: [id number] EVO 48 Hour Holter Monitor applied to patient.

## 2013-07-28 ENCOUNTER — Telehealth: Payer: Self-pay | Admitting: *Deleted

## 2013-07-28 DIAGNOSIS — I472 Ventricular tachycardia: Secondary | ICD-10-CM

## 2013-07-28 NOTE — Telephone Encounter (Signed)
Dr Excell Seltzer left msg with pt and Dr Excell Seltzer will try to contact him tomorrow regarding 48 hr holter./ taken to be scanned  Pt sinus brady/ sinus rhythm,  freq pvc's - ventricular bigeminy Short run of NSVT Recommended exercise stress myoview then EP evaluation.

## 2013-07-29 NOTE — Telephone Encounter (Signed)
Returning your call. °

## 2013-07-29 NOTE — Telephone Encounter (Signed)
Asked pt to call me back and ask for myself/ triage.

## 2013-07-29 NOTE — Telephone Encounter (Signed)
spoke with pt regarding 48 hr monitor results, stress myoview was explained and he verbalized understanding. Paperwork to go to [facility] to schedule test and an app with EP.

## 2013-08-03 NOTE — Telephone Encounter (Signed)
Pt scheduled for myoview on 08/10/13 and follow-up with Dr Graciela Husbands 08/30/13.

## 2013-08-10 ENCOUNTER — Ambulatory Visit (HOSPITAL_COMMUNITY): Payer: BC Managed Care – PPO | Attending: Cardiology | Admitting: Radiology

## 2013-08-10 VITALS — BP 146/96 | HR 48 | Ht 64.0 in | Wt 178.0 lb

## 2013-08-10 DIAGNOSIS — R002 Palpitations: Secondary | ICD-10-CM | POA: Insufficient documentation

## 2013-08-10 DIAGNOSIS — I472 Ventricular tachycardia: Secondary | ICD-10-CM

## 2013-08-10 DIAGNOSIS — R079 Chest pain, unspecified: Secondary | ICD-10-CM | POA: Insufficient documentation

## 2013-08-10 DIAGNOSIS — I4949 Other premature depolarization: Secondary | ICD-10-CM

## 2013-08-10 DIAGNOSIS — I2510 Atherosclerotic heart disease of native coronary artery without angina pectoris: Secondary | ICD-10-CM

## 2013-08-10 DIAGNOSIS — R5381 Other malaise: Secondary | ICD-10-CM | POA: Insufficient documentation

## 2013-08-10 MED ORDER — TECHNETIUM TC 99M SESTAMIBI GENERIC - CARDIOLITE
10.0000 | Freq: Once | INTRAVENOUS | Status: AC | PRN
  Administered 2013-08-10: 10 via INTRAVENOUS

## 2013-08-10 MED ORDER — TECHNETIUM TC 99M SESTAMIBI GENERIC - CARDIOLITE
30.0000 | Freq: Once | INTRAVENOUS | Status: AC | PRN
  Administered 2013-08-10: 30 via INTRAVENOUS

## 2013-08-10 NOTE — Progress Notes (Signed)
[facility] SITE 3 NUCLEAR MED [address] [place], [place] [address] [phone]    Cardiology Nuclear Med Study  Zachary Stone is a 57 y.o. male     MRN : [id number]     DOB: 1956/09/17  Procedure Date: 08/10/2013  Nuclear Med Background Indication for Stress Test:  Evaluation for Ischemia History:  '03 Echo:EF=60%; '03 AWMI>Stent-LAD x 2; '06 Stents-TCA/LAD; '12 QMV:[id number]-[id number] scar, no ischemia, EF=51>Cath:n/o CAD, EF=55% Cardiac Risk Factors: Family History - CAD, Hypertension and Lipids  Symptoms:  Chest Pain/Pressure/Tightness (last episode of chest discomfort was about 2-nights ago), Fatigue and Palpitations   Nuclear Pre-Procedure Caffeine/Decaff Intake:  None NPO After: 9:00pm   Lungs:  Clear. O2 Sat: 97% on room air. IV 0.9% NS with Angio Cath:  22g  IV Site: R Hand  IV Started by:  Bonnita Levan, RN  Chest Size (in):  46 Cup Size: n/a  Height: 5\' 4"  (1.626 m)  Weight:  178 lb (80.74 kg)  BMI:  Body mass index is 30.54 kg/(m^2). Tech Comments:  N/A    Nuclear Med Study 1 or 2 day study: 1 day  Stress Test Type:  Stress  Reading MD: Tobias Alexander, MD  Order Authorizing Provider:  [name], MD  Resting Radionuclide: Technetium 23m Sestamibi  Resting Radionuclide Dose: 10.6 mCi   Stress Radionuclide:  Technetium 46m Sestamibi  Stress Radionuclide Dose: 32.8 mCi           Stress Protocol Rest HR: 48 Stress HR: 142  Rest BP: 146/96 Stress BP: 170/83  Exercise Time (min): 10:01 METS: 11.7   Predicted Max HR: 161 bpm % Max HR: 88.2 bpm Rate Pressure Product: 32440   Dose of Adenosine (mg):  n/a Dose of Lexiscan: n/a mg  Dose of Atropine (mg): n/a Dose of Dobutamine: n/a mcg/kg/min (at max HR)  Stress Test Technologist: Smiley Houseman, CMA-N  Nuclear Technologist:  Domenic Polite, CNMT     Rest Procedure:  Myocardial perfusion imaging was performed at rest 45 minutes following the intravenous administration of Technetium 33m  Sestamibi.  Rest ECG: Sinus bradycardia, old anterior MI  Stress Procedure:  The patient exercised on the treadmill utilizing the Bruce Protocol for 10:01 minutes. The patient stopped due to fatigue and denied any chest pain.  Technetium 3m Sestamibi was injected at peak exercise and myocardial perfusion imaging was performed after a brief delay.  Stress ECG: No significant change from baseline ECG  QPS Raw Data Images:  There is no interference from nuclear activity from structures below the diaphragm.   Stress Images:  There is a decrease uptake in the apex and distal anterior walls.  Rest Images:  There is a decrease uptake in the apex and distal anterior walls.  Subtraction (SDS):  No evidence of ischemia. Transient Ischemic Dilatation (Normal <1.22):  n/a Lung/Heart Ratio (Normal <0.45):  0.58  Quantitative Gated Spect Images QGS EDV:  143 ml QGS ESV:  80 ml  Impression Exercise Capacity:  Excellent exercise capacity. BP Response:  Normal blood pressure response. Clinical Symptoms:  No significant symptoms noted. ECG Impression:  No significant ST segment change suggestive of ischemia. Comparison with Prior Nuclear Study: No images to compare  Overall Impression:  Low risk stress nuclear study with a small irreversible defect in the distal anterior and apicall walls consistent with prior myocardial infarction in the distal LAD territory. .  LV Ejection Fraction: 44%.  LV Wall Motion:  Dyskinesis of the LV apex.   [name],  Faustino Congress 08/10/2013

## 2013-08-11 ENCOUNTER — Encounter: Payer: Self-pay | Admitting: Physician Assistant

## 2013-08-12 ENCOUNTER — Telehealth: Payer: Self-pay | Admitting: *Deleted

## 2013-08-12 DIAGNOSIS — I2510 Atherosclerotic heart disease of native coronary artery without angina pectoris: Secondary | ICD-10-CM

## 2013-08-12 NOTE — Telephone Encounter (Signed)
pt notified about myoview results and the need for echo to assess EF. Advised I will have scheduling dept cb and schedule echo. Pt verbalized understanding to Plan of Care

## 2013-08-20 ENCOUNTER — Telehealth: Payer: Self-pay | Admitting: Cardiovascular Disease

## 2013-08-20 MED ORDER — VALSARTAN 80 MG PO TABS: 80.0000 mg | ORAL_TABLET | Freq: Every day | ORAL | Status: DC

## 2013-08-20 NOTE — Telephone Encounter (Signed)
New Problem:  Pt states he has been having high BP. Pt states  he checked his BP a week ago and it was 155/90. Yesterday it was 150/85. Today it was 155/94 . Pt states he would like to be advised on what to with his BP. Pt states he was taken off diovan 4 weeks ago b/c his BP was too low.Marland KitchenMarland Kitchen

## 2013-08-20 NOTE — Telephone Encounter (Signed)
Patient was in August 2014 and his Diovan was discontinued due to consistent low BP 90/50's. Patient was in last week and BP was elevated during Stress Test. Since then, patient has been checking BP twice daily with average results of 155/90 and highest reading of 155/94. Patient denies SOB, headache, or swelling. States he does feel "like my heart is having to work a little harder" and "feel a bit woozy at times, not dizzy though". Dr. Clifton James advises that patient should resume taking the Diovan 80mg  by mouth daily.  Patient is scheduled for ECHO on 08/26/13, has appts to see Dr. Graciela Husbands on 10/13 and Dr. Excell Seltzer on 09/08/13. Patient states he will continue to check his BP to discuss with Dr. Excell Seltzer. Patient encouraged to call office back for any further changes or if he notes that his BP is low 90/50's or he is symptomatic, including dizziness or lightheadedness. Patient agreed with treatment plan.

## 2013-08-26 ENCOUNTER — Ambulatory Visit (HOSPITAL_COMMUNITY): Payer: BC Managed Care – PPO | Attending: Internal Medicine | Admitting: Radiology

## 2013-08-26 ENCOUNTER — Telehealth: Payer: Self-pay | Admitting: Cardiovascular Disease

## 2013-08-26 ENCOUNTER — Other Ambulatory Visit (HOSPITAL_COMMUNITY): Payer: Self-pay | Admitting: Cardiovascular Disease

## 2013-08-26 DIAGNOSIS — E785 Hyperlipidemia, unspecified: Secondary | ICD-10-CM | POA: Insufficient documentation

## 2013-08-26 DIAGNOSIS — I2510 Atherosclerotic heart disease of native coronary artery without angina pectoris: Secondary | ICD-10-CM | POA: Insufficient documentation

## 2013-08-26 DIAGNOSIS — R42 Dizziness and giddiness: Secondary | ICD-10-CM | POA: Insufficient documentation

## 2013-08-26 DIAGNOSIS — R5381 Other malaise: Secondary | ICD-10-CM | POA: Insufficient documentation

## 2013-08-26 DIAGNOSIS — I10 Essential (primary) hypertension: Secondary | ICD-10-CM | POA: Insufficient documentation

## 2013-08-26 DIAGNOSIS — I252 Old myocardial infarction: Secondary | ICD-10-CM | POA: Insufficient documentation

## 2013-08-26 DIAGNOSIS — I25810 Atherosclerosis of coronary artery bypass graft(s) without angina pectoris: Secondary | ICD-10-CM

## 2013-08-26 NOTE — Telephone Encounter (Signed)
Per Dr Excell Seltzer the pt's Echo shows EF of 35-40%. This is new for the pt. Dr Excell Seltzer does not want the pt traveling at this time. Dr Excell Seltzer recommends that the pt have a cardiac catheterization performed. He would like the pt to see Dr Graciela Husbands as scheduled on 08/30/13 and then be scheduled for a cath.  I will forward this message to Dr Graciela Husbands and his nurse for follow-up.

## 2013-08-26 NOTE — Progress Notes (Signed)
Echocardiogram performed.  

## 2013-08-26 NOTE — Telephone Encounter (Signed)
Patient said his father in law past away this morning and he is concerned because he need to fly to get their and not really healthy enough to travel. He is here having a Echo done. Can you please speak with this patient before he leaves.

## 2013-08-26 NOTE — Telephone Encounter (Signed)
I spoke with the pt after his Echocardiogram and he states that his father-in-law passed away this morning in [place].  Due to the pt's recent health issues he is concerned as to whether he can travel.  The pt has been monitoring his BP and pulse at home.  BP range 120-145/60s and pulse in the 60s. The pt states he still has symptoms of feeling like he might pass out and this can be with movement or rest.  The pt is schedueld to see EP on 08/30/13. Will await Echo results to assess EF. Pt's contact: [phone]

## 2013-08-30 ENCOUNTER — Ambulatory Visit (INDEPENDENT_AMBULATORY_CARE_PROVIDER_SITE_OTHER): Payer: BC Managed Care – PPO | Admitting: Internal Medicine

## 2013-08-30 ENCOUNTER — Encounter: Payer: Self-pay | Admitting: Internal Medicine

## 2013-08-30 VITALS — BP 117/77 | HR 61 | Ht 65.0 in | Wt 181.5 lb

## 2013-08-30 DIAGNOSIS — R42 Dizziness and giddiness: Secondary | ICD-10-CM | POA: Insufficient documentation

## 2013-08-30 DIAGNOSIS — I25810 Atherosclerosis of coronary artery bypass graft(s) without angina pectoris: Secondary | ICD-10-CM

## 2013-08-30 DIAGNOSIS — I472 Ventricular tachycardia: Secondary | ICD-10-CM

## 2013-08-30 NOTE — Progress Notes (Signed)
ELECTROPHYSIOLOGY CONSULT NOTE  Patient ID: Zachary Stone, MRN: [id number], DOB/AGE: [birth year] 59 y.o. Admit date: (Not on file) Date of Consult: 08/30/2013  Primary Physician: Marga Melnick, MD Primary Cardiologist: [facility]  Chief Complaint: presyncope   HPI Zachary Stone is a 57 y.o. male  Seen for spells of presyncope in the setting of newly identified left ventricular dysfunction.  He was told at work that  his heart rate was in the 30s and 40s. He has episodes of lightheadedness that are quite brief and are associated with palpitations. These occur multiple times in the day. He has not had syncope.  He underwent a Holter monitor that demonstrated 21,000 PVCs over 48 hours comprising 13% of the beats. There were also episodes of nonsustained ventricular tachycardia some of which were polymorphic. Primary   PVC morphology was a left bundle branch superior axis.  Echocardiogram had demonstrated an EF of 35-40%. He underwent Myoview scanning with an EF of 44% a small reversible apical defect without ischemia   He has a history of coronary disease. He is prior anterior wall MI in 2003 treated with a bare-metal stenting. He subsequently underwent DES stenting of his RCA and LAD in 2006. Echo in 2003 demonstrated EF of 60% with apical akinesis. Myoview January 2012 demonstrated an anteroapical MI no ischemia and an EF of 51%.     Intercurrently he has developed symptoms of of shortness of breath with exertion it can come and go. He has not had chest discomfort.        Past Medical History  Diagnosis Date  . Diverticulosis     on CT scan 2009  . Diverticulitis     on CT 2012  . Hyperlipidemia   . Coronary artery disease     s/p anterior MI in 2003 treated with a BMS to the LAD x2. He subsequently underwent DES placement to the RCA as well the LAD in January 2006.  Echo 9/03: Apical AK, EF 60%.  Last LHC 11/2010:  dLM 20, prox to mid LAD stents with mod ISR (40-50%), oD2 jailed  (99% - non flow limiting), mCFX 30, OM2 30, pRCA stent ok with 20% ISR, EF 50-55%.  Myoview 11/2010: ant-apical MI, no isch, EF 51%, low risk;    . Hypertension   . Hx of cardiovascular stress test     ETT-Myoview (9/14):  Low risk, EF 44%, dAnt and apical scar, no ischemia      Surgical History:  Past Surgical History  Procedure Laterality Date  . Cholecystectomy    . Coronary angioplasty with stent placement      Dr.Stuckey  . Tonsillectomy    . Colonoscopy  2007    Negative recheck 2017  . Vasectomy       Home Meds: Prior to Admission medications   Medication Sig Start Date End Date Taking? Authorizing Provider  aspirin 81 MG tablet Take 81 mg by mouth daily.      Historical Provider, MD  clonazePAM (KLONOPIN) 0.5 MG tablet TAKE ONE TABLET BY MOUTH AT BEDTIME AS NEEDED 06/01/13   Pecola Lawless, MD  clopidogrel (PLAVIX) 75 MG tablet Take 1 tablet (75 mg total) by mouth daily. 02/04/13 02/04/14  Herby Abraham, MD  ezetimibe-simvastatin (VYTORIN) 10-20 MG per tablet Take 1 tablet by mouth at bedtime. 02/04/13   Herby Abraham, MD  Multiple Vitamin (MULTIVITAMIN) tablet Take 1 tablet by mouth daily.      Historical Provider, MD  valsartan (DIOVAN) 80  MG tablet Take 1 tablet (80 mg total) by mouth daily. 08/20/13   [name], MD     Allergies:  Allergies  Allergen Reactions  . Clams [Shellfish Allergy]   . Iodine     Patient has never had CT contrast to know if he is allergic to Iodine.  Only allergic to Clams and Morphine.  JWM 05/06/11:  Had Ct contrast today and did not have a reaction.  . Morphine     History   Social History  . Marital Status: Married    Spouse Name: N/A    Number of Children: N/A  . Years of Education: N/A   Occupational History  . Not on file.   Social History Main Topics  . Smoking status: Never Smoker   . Smokeless tobacco: Never Used  . Alcohol Use: Yes     Comment: 2-3 glasses of wine / month  . Drug Use: No  . Sexual Activity:  Not on file   Other Topics Concern  . Not on file   Social History Narrative  . No narrative on file     Family History  Problem Relation Age of Onset  . Coronary artery disease Mother   . Skin cancer Maternal Uncle   . Diabetes      MGGM     ROS:  Please see the history of present illness.    All other systems reviewed and negative.    Physical Exam:* BP 117/77  Pulse 61  Ht 5\' 5"  (1.651 m)  Wt 181 lb 8 oz (82.328 kg)  BMI 30.20 kg/m2  General: Well developed, well nourished male in no acute distress. Head: Normocephalic, atraumatic, sclera non-icteric, no xanthomas, nares are without discharge. EENT: normal Lymph Nodes:  none Back: without scoliosis/kyphosis  no CVA tendersness Neck: Negative for carotid bruits. JVD not elevated. Lungs: Clear bilaterally to auscultation without wheezes, rales, or rhonchi. Breathing is unlabored. Heart: Irregular rhythm with S1 S2.2/6 systolic murmur , rubs, or gallops appreciated. Abdomen: Soft, non-tender, non-distended with normoactive bowel sounds. No hepatomegaly. No rebound/guarding. No obvious abdominal masses. Msk:  Strength and tone appear normal for age. Extremities: No clubbing or cyanosis. No* * edema.  Distal pedal pulses are 2+ and equal bilaterally. Skin: Warm and Dry Neuro: Alert and oriented X 3. CN III-XII intact Grossly normal sensory and motor function . Psych:  Responds to questions appropriately with a normal affect.      Labs: Cardiac Enzymes No results found for this basename: CKTOTAL, CKMB, TROPONINI,  in the last 72 hours CBC Lab Results  Component Value Date   WBC 7.3 07/12/2013   HGB 15.5 07/12/2013   HCT 45.3 07/12/2013   MCV 92.7 07/12/2013   PLT 190.0 07/12/2013   PROTIME: No results found for this basename: LABPROT, INR,  in the last 72 hours Chemistry No results found for this basename: NA, K, CL, CO2, BUN, CREATININE, CALCIUM, LABALBU, PROT, BILITOT, ALKPHOS, ALT, AST, GLUCOSE,  in the last 168  hours Lipids Lab Results  Component Value Date   CHOL 87 03/09/2012   HDL 40.10 03/09/2012   LDLCALC 38 03/09/2012   TRIG 43.0 03/09/2012   BNP No results found for this basename: probnp   Miscellaneous No results found for this basename: DDIMER    Radiology/Studies:  No results found.  EKG:  NSR 50  16/09/41  Assessment and Plan:   Sherryl Manges

## 2013-08-30 NOTE — Assessment & Plan Note (Signed)
As above.

## 2013-08-30 NOTE — Patient Instructions (Signed)
Your physician has requested that you have a cardiac MRI. Cardiac MRI uses a computer to create images of your heart as its beating, producing both still and moving pictures of your heart and major blood vessels. For further information please visit [email or web address]. Please follow the instruction sheet given to you today for more information. - MACHINE IS BEING REPAIRED AT THE MOMENT, OFFICE WILL CALL YOU WHEN WE CAN SCHEDULE THIS.   Your physician has requested that you have a cardiac catheterization. Cardiac catheterization is used to diagnose and/or treat various heart conditions. Doctors may recommend this procedure for a number of different reasons. The most common reason is to evaluate chest pain. Chest pain can be a symptom of coronary artery disease (CAD), and cardiac catheterization can show whether plaque is narrowing or blocking your heart's arteries. This procedure is also used to evaluate the valves, as well as measure the blood flow and oxygen levels in different parts of your heart. For further information please visit [email or web address] - OFFICE WILL BE IN TOUCH TO SCHEDULE THIS PROCEDURE  Your physician recommends that you continue on your current medications as directed. Please refer to the Current Medication list given to you today.

## 2013-08-30 NOTE — Assessment & Plan Note (Signed)
The patient has nonsustained ventricular tachycardia and frequent ventricular ectopy comprising 13% of his heart beats. This raises a number of issues. The first is what is the cause of the nonsustained ventricular tachycardia. Morphologically these appear to be emerging right ventricular floor, there  also runs of nonsustained   ventricular tachycardia neither of which have the morphology of the dominant PVC.  There are symptoms associated with these related both probably to the bradycardia as well as the nonsustained ventricular arrhythmia. He has resting bradycardia. In the event that he  e does not have coronary disease that could potentially explain the change in voltage threshold, I would use mexiletine to suppress ventricular ectopy . he would also potentially be a candidate for catheter ablation of the dominant morphology which may have a role given his newly identified cardiomyopathy which is possibly secondary to the PVCs  We had a lengthy discussion regarding the above issues. Given the atypical site of origin of his ventricular ectopy and it being discordant from what is seen on his Myoview scan I recommended MRI scanning to explore the inferior wall of his heart morphologically to see if we can identify a potential secondary explanation. Currently the MRI scanner is not available

## 2013-08-30 NOTE — Telephone Encounter (Signed)
Dr. Graciela Husbands saw patient today - ok to go ahead with cath. Will forward to Dr. Earmon Phoenix nurse Julieta Gutting for scheduling of cath

## 2013-08-31 NOTE — Telephone Encounter (Signed)
This pt has not formally met Dr Excell Seltzer since Dr Riley Kill retired. The pt is scheduled to see Dr Excell Seltzer on 09/08/13. I will ask Dr Excell Seltzer if he would like to meet the pt in the office on 10/22 and then perform cath on 09/09/13 or meet him in short stay on 09/06/13 for cardiac catheterization.

## 2013-08-31 NOTE — Telephone Encounter (Signed)
I spoke with the pt and he will keep his appointment on 09/08/13 with Dr Excell Seltzer and we will plan for cardiac cath on 09/09/13.

## 2013-08-31 NOTE — Telephone Encounter (Signed)
Follow Up   Pt calling to schedule a cath

## 2013-08-31 NOTE — Telephone Encounter (Signed)
Would prefer 10/22 visit then 10/23 cath if ok with Zachary Stone. If we prefers to get cath done 10/20 I'm ok with that, too.

## 2013-08-31 NOTE — Telephone Encounter (Signed)
Left message for pt to call back  °

## 2013-09-06 ENCOUNTER — Telehealth: Payer: Self-pay | Admitting: Cardiovascular Disease

## 2013-09-06 NOTE — Telephone Encounter (Signed)
New problem:  Pt states his symptoms have gotten worse and he would like a sooner surgery date. Please advise

## 2013-09-06 NOTE — Telephone Encounter (Signed)
I spoke with the cardiac cath lab and the schedule if full today for in patient and out patient procedures.  Per Dr Excell Seltzer the pt can be admitted today and placed on cath schedule tomorrow or keep cath scheduled for Thursday.  I made the pt aware of his options and at this time he feels okay and is resting at home.  The pt would like to hold off on admission and keep cath scheduled for Thursday.  The pt said he will call EMS or our office if he has any change in symptoms and would like to be admitted.

## 2013-09-06 NOTE — Telephone Encounter (Signed)
I spoke with the pt and he left work today because he did not feel well.  The pt said he is out of breath, having stiffness in chest and headache. I will speak with Dr Excell Seltzer to see if the pt can be added to the cardiac cath schedule today.

## 2013-09-08 ENCOUNTER — Ambulatory Visit (INDEPENDENT_AMBULATORY_CARE_PROVIDER_SITE_OTHER): Payer: BC Managed Care – PPO | Admitting: Cardiovascular Disease

## 2013-09-08 ENCOUNTER — Encounter: Payer: Self-pay | Admitting: Cardiovascular Disease

## 2013-09-08 VITALS — BP 108/76 | HR 50 | Ht 64.0 in | Wt 181.0 lb

## 2013-09-08 DIAGNOSIS — I10 Essential (primary) hypertension: Secondary | ICD-10-CM

## 2013-09-08 DIAGNOSIS — I2510 Atherosclerotic heart disease of native coronary artery without angina pectoris: Secondary | ICD-10-CM

## 2013-09-08 LAB — BASIC METABOLIC PANEL
BUN: 18 mg/dL (ref 6–23)
Calcium: 9.4 mg/dL (ref 8.4–10.5)
GFR: 92.8 mL/min (ref >60.00)
Glucose, Bld: 99 mg/dL (ref 70–99)
Potassium: 4.1 mEq/L (ref 3.5–5.1)
Sodium: 139 mEq/L (ref 135–145)

## 2013-09-08 LAB — CBC WITH DIFFERENTIAL/PLATELET
Basophils Absolute: 0 10*3/uL (ref 0.0–0.1)
Eosinophils Relative: 2.6 % (ref 0.0–5.0)
HCT: 47.4 % (ref 39.0–52.0)
Hemoglobin: 16.2 g/dL (ref 13.0–17.0)
Lymphs Abs: 2.7 10*3/uL (ref 0.7–4.0)
MCHC: 34.1 g/dL (ref 30.0–36.0)
Monocytes Relative: 8.4 % (ref 3.0–12.0)
Neutro Abs: 3.8 10*3/uL (ref 1.4–7.7)
Neutrophils Relative %: 52.1 % (ref 43.0–77.0)
Platelets: 193 10*3/uL (ref 150.0–400.0)
RDW: 13.1 % (ref 11.5–14.6)
WBC: 7.3 10*3/uL (ref 4.5–10.5)

## 2013-09-08 LAB — PROTIME-INR: Prothrombin Time: 10.8 s (ref 10.2–12.4)

## 2013-09-08 NOTE — Progress Notes (Signed)
HPI:  57 year old gentleman presenting for followup evaluation. The patient has a history of coronary artery disease dating back to 2003 when he presented with an acute anterior wall MI treated with a bare-metal stenting of the LAD. He has gone on to require interventions of the right coronary artery and repeat PCI of the LAD. He returns today for discussion of cardiac catheterization. The patient's left ventricular ejection fraction previously has been normal, but a recent echocardiogram demonstrated an LVEF of 35-40%. A Myoview scan showed a reversible defect in the apex without significant ischemia and was considered low risk.  The patient is experiencing chest pains. He has 2 separate types of pains. He describes a "stiffness" in his chest that is self-limited. This is not associated with exertion. He also has sharp chest pains of sudden onset that occur with rest. He does experience some discomfort in his chest in his first 5 minutes of exercise but this resolves if he keeps going. The patient has noted increased fatigue with exercise and he does have exertional dyspnea as well. He denies palpitations, orthopnea, or PND. He admits to episodes of lightheadedness. He denies edema.  Outpatient Encounter Prescriptions as of 09/08/2013  Medication Sig Dispense Refill  . aspirin 81 MG tablet Take 81 mg by mouth daily.        . clonazePAM (KLONOPIN) 0.5 MG tablet TAKE ONE TABLET BY MOUTH AT BEDTIME AS NEEDED  90 tablet  0  . clopidogrel (PLAVIX) 75 MG tablet Take 1 tablet (75 mg total) by mouth daily.  90 tablet  3  . ezetimibe-simvastatin (VYTORIN) 10-20 MG per tablet Take 1 tablet by mouth at bedtime.  90 tablet  3  . Multiple Vitamin (MULTIVITAMIN) tablet Take 1 tablet by mouth daily.        . valsartan (DIOVAN) 80 MG tablet Take 1 tablet (80 mg total) by mouth daily.  90 tablet  1   No facility-administered encounter medications on file as of 09/08/2013.    Allergies  Allergen Reactions  .  Clams [Shellfish Allergy]   . Iodine     Patient has never had CT contrast to know if he is allergic to Iodine.  Only allergic to Clams and Morphine.  JWM 05/06/11:  Had Ct contrast today and did not have a reaction.  . Morphine     Past Medical History  Diagnosis Date  . Diverticulosis     on CT scan 2009  . Diverticulitis     on CT 2012  . Hyperlipidemia   . Coronary artery disease     s/p anterior MI in 2003 treated with a BMS to the LAD x2. He subsequently underwent DES placement to the RCA as well the LAD in January 2006.  Echo 9/03: Apical AK, EF 60%.  Last LHC 11/2010:  dLM 20, prox to mid LAD stents with mod ISR (40-50%), oD2 jailed (99% - non flow limiting), mCFX 30, OM2 30, pRCA stent ok with 20% ISR, EF 50-55%.  Myoview 11/2010: ant-apical MI, no isch, EF 51%, low risk;    . Hypertension   . Hx of cardiovascular stress test     ETT-Myoview (9/14):  Low risk, EF 44%, dAnt and apical scar, no ischemia    ROS: Negative except as per HPI  BP 108/76  Pulse 50  Ht 5\' 4"  (1.626 m)  Wt 181 lb (82.101 kg)  BMI 31.05 kg/m2  PHYSICAL EXAM: Pt is alert and oriented, NAD HEENT: normal Neck: JVP -  normal, carotids 2+= without bruits Lungs: CTA bilaterally CV: RRR without murmur or gallop Abd: soft, NT, Positive BS, no hepatomegaly Ext: no C/C/E, distal pulses intact and equal Skin: warm/dry no rash  EKG:  Sinus bradycardia 50 beats per minute, cannot rule out anteroseptal infarct age undetermined, inferior infarct age undetermined.  2-D echocardiogram 08/26/2012: Study Conclusions  - Left ventricle: The cavity size was normal. Wall thickness was normal. Systolic function was moderately reduced. The estimated ejection fraction was in the range of 35% to 40%. Akinesis of the mid-distalanteroseptal myocardium. - Left atrium: The atrium was mildly dilated. - Right ventricle: Systolic function was mildly to moderately reduced.  Myoview 08/11/2013: Impression  Exercise  Capacity: Excellent exercise capacity.  BP Response: Normal blood pressure response.  Clinical Symptoms: No significant symptoms noted.  ECG Impression: No significant ST segment change suggestive of ischemia.  Comparison with Prior Nuclear Study: No images to compare  Overall Impression: Low risk stress nuclear study with a small irreversible defect in the distal anterior and apicall walls consistent with prior myocardial infarction in the distal LAD territory. .  LV Ejection Fraction: 44%. LV Wall Motion: Dyskinesis of the LV apex.   ASSESSMENT AND PLAN: 1. Coronary atherosclerosis, native vessel. The patient is having increased chest pain, exertional dyspnea, and fatigue. He also has worsening of LV function, now with moderate left ventricular systolic dysfunction and an EF of 35-40%. I think cardiac catheterization is indicated to evaluate for progressive CAD in the setting of previous multivessel stenting. I have reviewed the risks, indications, and alternatives to cardiac catheterization and possible PCI. The patient understands and agrees to proceed. Will plan a right radial approach.  2. Cardiomyopathy. Patient recently evaluated by Dr. Graciela Husbands. He has both nonsustained ventricular tachycardia and frequent ventricular ectopy. Considerations for his cardiomyopathy include worsening ischemic disease versus ventricular ectopy. Further plans pending cardiac cath results tomorrow. Consideration for mexiletine noted. He is tolerating valsartan. I do not think we can add beta blocker because of his bradycardia.  3. Hyperlipidemia. Lipids from 2013 reviewed and demonstrated a cholesterol of 87, triglycerides 43, LDL 38, and HDL 40. He takes Vytorin 10/20 mg.  [name] 09/08/2013 10:16 AM

## 2013-09-08 NOTE — Patient Instructions (Signed)
Your physician has requested that you have a cardiac catheterization. Cardiac catheterization is used to diagnose and/or treat various heart conditions. Doctors may recommend this procedure for a number of different reasons. The most common reason is to evaluate chest pain. Chest pain can be a symptom of coronary artery disease (CAD), and cardiac catheterization can show whether plaque is narrowing or blocking your heart's arteries. This procedure is also used to evaluate the valves, as well as measure the blood flow and oxygen levels in different parts of your heart. For further information please visit [email or web address]. Please follow instruction sheet, as given.  Your physician recommends that you have lab work today: BMP, CBC and PT/INR   

## 2013-09-09 ENCOUNTER — Ambulatory Visit (HOSPITAL_COMMUNITY)
Admission: RE | Admit: 2013-09-09 | Discharge: 2013-09-09 | Disposition: A | Discharge status: Home / Self Care | Payer: BC Managed Care – PPO | Source: Ambulatory Visit | Attending: Cardiovascular Disease | Admitting: Cardiovascular Disease

## 2013-09-09 ENCOUNTER — Encounter (HOSPITAL_COMMUNITY)
Admission: RE | Disposition: A | Discharge status: Home / Self Care | Payer: Self-pay | Source: Ambulatory Visit | Attending: Cardiovascular Disease

## 2013-09-09 ENCOUNTER — Other Ambulatory Visit: Payer: Self-pay | Admitting: *Deleted

## 2013-09-09 DIAGNOSIS — R079 Chest pain, unspecified: Secondary | ICD-10-CM | POA: Insufficient documentation

## 2013-09-09 DIAGNOSIS — E785 Hyperlipidemia, unspecified: Secondary | ICD-10-CM | POA: Insufficient documentation

## 2013-09-09 DIAGNOSIS — R0989 Other specified symptoms and signs involving the circulatory and respiratory systems: Secondary | ICD-10-CM | POA: Insufficient documentation

## 2013-09-09 DIAGNOSIS — I10 Essential (primary) hypertension: Secondary | ICD-10-CM | POA: Insufficient documentation

## 2013-09-09 DIAGNOSIS — I472 Ventricular tachycardia, unspecified: Secondary | ICD-10-CM | POA: Insufficient documentation

## 2013-09-09 DIAGNOSIS — Z79899 Other long term (current) drug therapy: Secondary | ICD-10-CM | POA: Insufficient documentation

## 2013-09-09 DIAGNOSIS — I428 Other cardiomyopathies: Secondary | ICD-10-CM | POA: Insufficient documentation

## 2013-09-09 DIAGNOSIS — I4729 Other ventricular tachycardia: Secondary | ICD-10-CM | POA: Insufficient documentation

## 2013-09-09 DIAGNOSIS — R0609 Other forms of dyspnea: Secondary | ICD-10-CM | POA: Insufficient documentation

## 2013-09-09 DIAGNOSIS — I2510 Atherosclerotic heart disease of native coronary artery without angina pectoris: Secondary | ICD-10-CM

## 2013-09-09 DIAGNOSIS — Z9861 Coronary angioplasty status: Secondary | ICD-10-CM | POA: Insufficient documentation

## 2013-09-09 DIAGNOSIS — I4949 Other premature depolarization: Secondary | ICD-10-CM | POA: Insufficient documentation

## 2013-09-09 DIAGNOSIS — I519 Heart disease, unspecified: Secondary | ICD-10-CM | POA: Insufficient documentation

## 2013-09-09 DIAGNOSIS — I252 Old myocardial infarction: Secondary | ICD-10-CM | POA: Insufficient documentation

## 2013-09-09 HISTORY — PX: LEFT HEART CATHETERIZATION WITH CORONARY ANGIOGRAM: SHX5451

## 2013-09-09 SURGERY — LEFT HEART CATHETERIZATION WITH CORONARY ANGIOGRAM
Anesthesia: LOCAL

## 2013-09-09 MED ORDER — ASPIRIN 81 MG PO CHEW
81.0000 mg | CHEWABLE_TABLET | ORAL | Status: AC
  Administered 2013-09-09: 81 mg via ORAL

## 2013-09-09 MED ORDER — LIDOCAINE HCL (PF) 1 % IJ SOLN
INTRAMUSCULAR | Status: AC
  Filled 2013-09-09: qty 30

## 2013-09-09 MED ORDER — DIAZEPAM 5 MG PO TABS
ORAL_TABLET | ORAL | Status: AC
  Filled 2013-09-09: qty 1

## 2013-09-09 MED ORDER — SODIUM CHLORIDE 0.9 % IV SOLN: 250.0000 mL | INTRAVENOUS | Status: DC | PRN

## 2013-09-09 MED ORDER — NITROGLYCERIN 0.2 MG/ML ON CALL CATH LAB
INTRAVENOUS | Status: AC
  Filled 2013-09-09: qty 1

## 2013-09-09 MED ORDER — ACETAMINOPHEN 325 MG PO TABS: 650.0000 mg | ORAL_TABLET | ORAL | Status: DC | PRN

## 2013-09-09 MED ORDER — SODIUM CHLORIDE 0.9 % IV SOLN
INTRAVENOUS | Status: DC
  Administered 2013-09-09: 06:00:00 via INTRAVENOUS

## 2013-09-09 MED ORDER — SODIUM CHLORIDE 0.9 % IJ SOLN: 3.0000 mL | Freq: Two times a day (BID) | INTRAMUSCULAR | Status: DC

## 2013-09-09 MED ORDER — FENTANYL CITRATE 0.05 MG/ML IJ SOLN
INTRAMUSCULAR | Status: AC
  Filled 2013-09-09: qty 2

## 2013-09-09 MED ORDER — SODIUM CHLORIDE 0.9 % IV SOLN: 1.0000 mL/kg/h | INTRAVENOUS | Status: DC

## 2013-09-09 MED ORDER — ONDANSETRON HCL 4 MG/2ML IJ SOLN: 4.0000 mg | Freq: Four times a day (QID) | INTRAMUSCULAR | Status: DC | PRN

## 2013-09-09 MED ORDER — MIDAZOLAM HCL 2 MG/2ML IJ SOLN
INTRAMUSCULAR | Status: AC
  Filled 2013-09-09: qty 2

## 2013-09-09 MED ORDER — HEPARIN (PORCINE) IN NACL 2-0.9 UNIT/ML-% IJ SOLN
INTRAMUSCULAR | Status: AC
  Filled 2013-09-09: qty 1500

## 2013-09-09 MED ORDER — DIAZEPAM 5 MG PO TABS
5.0000 mg | ORAL_TABLET | ORAL | Status: AC
  Administered 2013-09-09: 5 mg via ORAL

## 2013-09-09 MED ORDER — ASPIRIN 81 MG PO CHEW
CHEWABLE_TABLET | ORAL | Status: AC
  Filled 2013-09-09: qty 1

## 2013-09-09 MED ORDER — MEXILETINE HCL 150 MG PO CAPS: 150.0000 mg | ORAL_CAPSULE | Freq: Two times a day (BID) | ORAL | Status: DC

## 2013-09-09 MED ORDER — SODIUM CHLORIDE 0.9 % IJ SOLN: 3.0000 mL | INTRAMUSCULAR | Status: DC | PRN

## 2013-09-09 NOTE — Progress Notes (Signed)
DR Excell Seltzer IN AND CHECKED RIGHT RADIAL SITE AND NO NEW ORDERS NOTED AND OK TO D/C HOME

## 2013-09-09 NOTE — Discharge Instructions (Signed)

## 2013-09-09 NOTE — H&P (View-Only) (Signed)
  HPI:  57-year-old gentleman presenting for followup evaluation. The patient has a history of coronary artery disease dating back to 2003 when he presented with an acute anterior wall MI treated with a bare-metal stenting of the LAD. He has gone on to require interventions of the right coronary artery and repeat PCI of the LAD. He returns today for discussion of cardiac catheterization. The patient's left ventricular ejection fraction previously has been normal, but a recent echocardiogram demonstrated an LVEF of 35-40%. A Myoview scan showed a reversible defect in the apex without significant ischemia and was considered low risk.  The patient is experiencing chest pains. He has 2 separate types of pains. He describes a "stiffness" in his chest that is self-limited. This is not associated with exertion. He also has sharp chest pains of sudden onset that occur with rest. He does experience some discomfort in his chest in his first 5 minutes of exercise but this resolves if he keeps going. The patient has noted increased fatigue with exercise and he does have exertional dyspnea as well. He denies palpitations, orthopnea, or PND. He admits to episodes of lightheadedness. He denies edema.  Outpatient Encounter Prescriptions as of 09/08/2013  Medication Sig Dispense Refill  . aspirin 81 MG tablet Take 81 mg by mouth daily.        . clonazePAM (KLONOPIN) 0.5 MG tablet TAKE ONE TABLET BY MOUTH AT BEDTIME AS NEEDED  90 tablet  0  . clopidogrel (PLAVIX) 75 MG tablet Take 1 tablet (75 mg total) by mouth daily.  90 tablet  3  . ezetimibe-simvastatin (VYTORIN) 10-20 MG per tablet Take 1 tablet by mouth at bedtime.  90 tablet  3  . Multiple Vitamin (MULTIVITAMIN) tablet Take 1 tablet by mouth daily.        . valsartan (DIOVAN) 80 MG tablet Take 1 tablet (80 mg total) by mouth daily.  90 tablet  1   No facility-administered encounter medications on file as of 09/08/2013.    Allergies  Allergen Reactions  .  Clams [Shellfish Allergy]   . Iodine     Patient has never had CT contrast to know if he is allergic to Iodine.  Only allergic to Clams and Morphine.  JWM 05/06/11:  Had Ct contrast today and did not have a reaction.  . Morphine     Past Medical History  Diagnosis Date  . Diverticulosis     on CT scan 2009  . Diverticulitis     on CT 2012  . Hyperlipidemia   . Coronary artery disease     s/p anterior MI in 2003 treated with a BMS to the LAD x2. He subsequently underwent DES placement to the RCA as well the LAD in January 2006.  Echo 9/03: Apical AK, EF 60%.  Last LHC 11/2010:  dLM 20, prox to mid LAD stents with mod ISR (40-50%), oD2 jailed (99% - non flow limiting), mCFX 30, OM2 30, pRCA stent ok with 20% ISR, EF 50-55%.  Myoview 11/2010: ant-apical MI, no isch, EF 51%, low risk;    . Hypertension   . Hx of cardiovascular stress test     ETT-Myoview (9/14):  Low risk, EF 44%, dAnt and apical scar, no ischemia    ROS: Negative except as per HPI  BP 108/76  Pulse 50  Ht 5' 4" (1.626 m)  Wt 181 lb (82.101 kg)  BMI 31.05 kg/m2  PHYSICAL EXAM: Pt is alert and oriented, NAD HEENT: normal Neck: JVP -   normal, carotids 2+= without bruits Lungs: CTA bilaterally CV: RRR without murmur or gallop Abd: soft, NT, Positive BS, no hepatomegaly Ext: no C/C/E, distal pulses intact and equal Skin: warm/dry no rash  EKG:  Sinus bradycardia 50 beats per minute, cannot rule out anteroseptal infarct age undetermined, inferior infarct age undetermined.  2-D echocardiogram 08/26/2012: Study Conclusions  - Left ventricle: The cavity size was normal. Wall thickness was normal. Systolic function was moderately reduced. The estimated ejection fraction was in the range of 35% to 40%. Akinesis of the mid-distalanteroseptal myocardium. - Left atrium: The atrium was mildly dilated. - Right ventricle: Systolic function was mildly to moderately reduced.  Myoview 08/11/2013: Impression  Exercise  Capacity: Excellent exercise capacity.  BP Response: Normal blood pressure response.  Clinical Symptoms: No significant symptoms noted.  ECG Impression: No significant ST segment change suggestive of ischemia.  Comparison with Prior Nuclear Study: No images to compare  Overall Impression: Low risk stress nuclear study with a small irreversible defect in the distal anterior and apicall walls consistent with prior myocardial infarction in the distal LAD territory. .  LV Ejection Fraction: 44%. LV Wall Motion: Dyskinesis of the LV apex.   ASSESSMENT AND PLAN: 1. Coronary atherosclerosis, native vessel. The patient is having increased chest pain, exertional dyspnea, and fatigue. He also has worsening of LV function, now with moderate left ventricular systolic dysfunction and an EF of 35-40%. I think cardiac catheterization is indicated to evaluate for progressive CAD in the setting of previous multivessel stenting. I have reviewed the risks, indications, and alternatives to cardiac catheterization and possible PCI. The patient understands and agrees to proceed. Will plan a right radial approach.  2. Cardiomyopathy. Patient recently evaluated by Dr. Klein. He has both nonsustained ventricular tachycardia and frequent ventricular ectopy. Considerations for his cardiomyopathy include worsening ischemic disease versus ventricular ectopy. Further plans pending cardiac cath results tomorrow. Consideration for mexiletine noted. He is tolerating valsartan. I do not think we can add beta blocker because of his bradycardia.  3. Hyperlipidemia. Lipids from 2013 reviewed and demonstrated a cholesterol of 87, triglycerides 43, LDL 38, and HDL 40. He takes Vytorin 10/20 mg.  Megin Consalvo 09/08/2013 10:16 AM      

## 2013-09-09 NOTE — Interval H&P Note (Signed)
History and Physical Interval Note:  09/09/2013 7:33 AM  Zachary Stone  has presented today for surgery, with the diagnosis of Chest pain  The various methods of treatment have been discussed with the patient and family. After consideration of risks, benefits and other options for treatment, the patient has consented to  Procedure(s): LEFT HEART CATHETERIZATION WITH CORONARY ANGIOGRAM (N/A) as a surgical intervention .  The patient's history has been reviewed, patient examined, no change in status, stable for surgery.  I have reviewed the patient's chart and labs.  Questions were answered to the patient's satisfaction.    Cath Lab Visit (complete for each Cath Lab visit)  Clinical Evaluation Leading to the Procedure:   ACS: no  Non-ACS:    Anginal Classification: CCS III  Anti-ischemic medical therapy: No Therapy  Non-Invasive Test Results: Low-risk stress test findings: cardiac mortality <1%/year  Prior CABG: No previous CABG         [name]

## 2013-09-09 NOTE — Progress Notes (Signed)
SWELLING NOTED AGAIN AND RODNEY IN TO HOLD PRESSURE AND REMOVED TR BAND

## 2013-09-09 NOTE — Progress Notes (Signed)
ADDED 3CC AIR IN TR BAND AND WILL WAIT 1HR TO REMOVE AIR

## 2013-09-09 NOTE — Progress Notes (Signed)
SWELLING NOTED AT TR BAND SITE AGAIN AND PRESSURE HELD FOR 15 MIN AND ARM SOFT AGAIN

## 2013-09-09 NOTE — CV Procedure (Signed)
    Cardiac Catheterization Procedure Note  Name: BOSTEN NEWSTROM MRN: [id number] DOB: June 26, 1956  Procedure: Left Heart Cath, Selective Coronary Angiography, LV angiography  Indication: Known CAD, progressive LV dysfunction with dyspnea and chest pain   Procedural Details: The right wrist was prepped, draped, and anesthetized with 1% lidocaine. Using the modified Seldinger technique, a 5 French sheath was introduced into the right radial artery. 3 mg of verapamil was administered through the sheath, weight-based unfractionated heparin was administered intravenously. Standard Judkins catheters were used for selective coronary angiography and left ventriculography. Catheter exchanges were performed over an exchange length guidewire. There were no immediate procedural complications. A TR band was used for radial hemostasis at the completion of the procedure.  The patient was transferred to the post catheterization recovery area for further monitoring.  Procedural Findings: Hemodynamics: AO 99/60 LV 93/12  Coronary angiography: Coronary dominance: right  Left mainstem: Patent without significant stenosis. Divides into the LAD and LCx  Left anterior descending (LAD): The vessel is extensively stented. The proximal LAD has 30-40% stenosis prior to the stented segment. There is 50% stenosis after the first septal perforator. This area is unchanged from the previous study and does not appear flow-limiting. The distal stent has mild diffuse ISR of 30-40%. The apical LAD is patent. The first diagonal has 50% ostial stenosis.   Left circumflex (LCx): Patent vessel with mild luminal irregularities. OM1 and OM2 are patent with luminal irregularity but no significant stenosis.  Right coronary artery (RCA): Dominant vessel. The proximal stent has no significant restenosis. The PDA is patent without significant disease.  Left ventriculography: Left ventricular systolic function is moderately reduced with  apical akinesis and anterolateral hypokinesis. The LVEF is estimated at 35-40%.  Final Conclusions:   1. Continued wide patency of the RCA stent 2. Moderate mid-LAD stenosis unchanged from previous study 3. Patent LCx 4. Moderate segmental LV dysfunction  Recommendations: Discussed with Dr Graciela Husbands. Pt with high PVC burden. No clear coronary explanation for worsening LV function. Will start mexilitine and repeat holter in 2 weeks to reassess PVC burden. Med Rx for CAD.  [name] 09/09/2013, 8:35 AM

## 2013-09-09 NOTE — Progress Notes (Signed)
RECEIVED FROM CATH LAB AND CALLED CATH LAB STAFF AND REPORT RECEIVED THAT TR BAND HAS 18CC IN IT AND SWELLING NOTED AND DR COOPER AWARE.SWELLING NOTED PROXIMALLY TO TR BAND AND CATH LAB STAFF TO COME CHECK ARM AND RENEE,RN HOLDING PRESSURE PROXIMALLY TO TR BAND; DR COOPER IN AND MOVED TR BAND AND ASHLEY HELD PRESSURE ON TR BAND X 15 MIN AND SWELLING DECREASED AND 13CC IN TR BAND PER ASHLEY

## 2013-09-17 ENCOUNTER — Encounter: Payer: Self-pay | Admitting: Internal Medicine

## 2013-09-24 ENCOUNTER — Other Ambulatory Visit: Payer: Self-pay | Admitting: Internal Medicine

## 2013-09-24 NOTE — Telephone Encounter (Signed)
clonazePAM (KLONOPIN) 0.5 MG tablet Last OV: 03/26/13 Last refill: 06/01/2013 #90

## 2013-09-24 NOTE — Telephone Encounter (Signed)
OK X1 

## 2013-09-29 ENCOUNTER — Encounter: Payer: Self-pay | Admitting: *Deleted

## 2013-09-29 ENCOUNTER — Telehealth: Payer: Self-pay | Admitting: *Deleted

## 2013-09-29 ENCOUNTER — Encounter (INDEPENDENT_AMBULATORY_CARE_PROVIDER_SITE_OTHER): Payer: BC Managed Care – PPO

## 2013-09-29 DIAGNOSIS — R42 Dizziness and giddiness: Secondary | ICD-10-CM

## 2013-09-29 DIAGNOSIS — I472 Ventricular tachycardia, unspecified: Secondary | ICD-10-CM

## 2013-09-29 NOTE — Telephone Encounter (Signed)
Patient called our office to notify us that his insurance will no longer pay for his vytorin or diovan starting in December. It was his understanding that we would not be contacted by them about this change, but wanted to know what he needed to do about this in order to stay on these meds. He has an appointment on the 18th with Dr Graciela Husbands and he will bring a copy of the paper they sent him just in case we don't receive one.

## 2013-09-29 NOTE — Progress Notes (Signed)
Patient ID: Zachary Stone, male   DOB: 30-Jun-1956, 57 y.o.   MRN: [id number] E-Cardio 48 hour holter monitor applied to patient.

## 2013-09-30 ENCOUNTER — Telehealth: Payer: Self-pay | Admitting: Cardiovascular Disease

## 2013-09-30 NOTE — Telephone Encounter (Signed)
Walk In Pt Form " Approval Request" will hold For Zachary Stone On Friday 09/30/13/KM

## 2013-09-30 NOTE — Telephone Encounter (Signed)
Patient is coming in to office today with his wife and I will get copy of letter from [facility] regarding change in formulary for next year.

## 2013-10-05 ENCOUNTER — Ambulatory Visit (INDEPENDENT_AMBULATORY_CARE_PROVIDER_SITE_OTHER): Payer: BC Managed Care – PPO | Admitting: Internal Medicine

## 2013-10-05 ENCOUNTER — Encounter: Payer: Self-pay | Admitting: Internal Medicine

## 2013-10-05 VITALS — BP 121/80 | HR 62 | Ht 64.0 in | Wt 179.8 lb

## 2013-10-05 DIAGNOSIS — R42 Dizziness and giddiness: Secondary | ICD-10-CM

## 2013-10-05 DIAGNOSIS — I43 Cardiomyopathy in diseases classified elsewhere: Secondary | ICD-10-CM | POA: Insufficient documentation

## 2013-10-05 DIAGNOSIS — I498 Other specified cardiac arrhythmias: Secondary | ICD-10-CM

## 2013-10-05 DIAGNOSIS — I472 Ventricular tachycardia: Secondary | ICD-10-CM

## 2013-10-05 DIAGNOSIS — R001 Bradycardia, unspecified: Secondary | ICD-10-CM | POA: Insufficient documentation

## 2013-10-05 DIAGNOSIS — I4949 Other premature depolarization: Secondary | ICD-10-CM

## 2013-10-05 DIAGNOSIS — R000 Tachycardia, unspecified: Secondary | ICD-10-CM

## 2013-10-05 DIAGNOSIS — I25810 Atherosclerosis of coronary artery bypass graft(s) without angina pectoris: Secondary | ICD-10-CM

## 2013-10-05 DIAGNOSIS — I519 Heart disease, unspecified: Secondary | ICD-10-CM

## 2013-10-05 DIAGNOSIS — I493 Ventricular premature depolarization: Secondary | ICD-10-CM

## 2013-10-05 MED ORDER — ATORVASTATIN CALCIUM 80 MG PO TABS: 80.0000 mg | ORAL_TABLET | Freq: Every day | ORAL | Status: DC

## 2013-10-05 MED ORDER — LOSARTAN POTASSIUM 50 MG PO TABS: 50.0000 mg | ORAL_TABLET | Freq: Every day | ORAL | Status: DC

## 2013-10-05 NOTE — Assessment & Plan Note (Addendum)
Marked reduction in PVC burden and decreased symptoms Will reassess LV function by echo in a few weeks and then have him followup with Dr [facility]  If LV function is still low, would do MRI otherwise not  Continue mexilitene

## 2013-10-05 NOTE — Assessment & Plan Note (Signed)
The patient has sinus bradycardia minute heart rate excursion by Holter monitor. However, is not clear that is why. When he comes back to see Dr. Dewayne Hatch C., if he continues to have symptoms and dyspnea on exertion, suggest exercise testing for chronotropic competence assessment

## 2013-10-05 NOTE — Assessment & Plan Note (Signed)
Continue general therapy; however, if there is insurance issue with Vytorin. Not withstanding the recent results from improve-it will change for high-dose statins. In addition, there are insurance concerns regarding valsartan; we'll change him to losartan.

## 2013-10-05 NOTE — Assessment & Plan Note (Addendum)
We'll continue him on ARB; his relative bradycardia precludes beta blockers.  We will recheck a echocardiogram in about 4 weeks as there has been a significant interval decrease in  ventricular ectopy burden from 13--4%

## 2013-10-05 NOTE — Progress Notes (Signed)
Patient Care Team: Pecola Lawless, MD as PCP - General   HPI  Zachary Stone is a 57 y.o. male Seen in followup for presyncope in the setting of recently identified left ventricular dysfunction.  Holter monitor had demonstrated 21,000 PVCs over 48 hours comprising 13% of the beats. There were also episodes of nonsustained ventricular tachycardia some of which were polymorphic. Primary PVC morphology was a left bundle branch superior axis.   He underwent catheterization demonstrated no obstructive coronary disease and he was started on mexiletine. Repeat Holter monitor has shown significant interval decrease in PVC burden now down to about 4% coronary disease was stable with a mid LAD mild ISR  previously identified without change and patency of his RCA stent.  He is feeling much better. There is less "fogginess of thinking". There is less fatigue. He is tolerating mexiletine without nausea.  Echocardiogram had demonstrated an EF of 35-40%.  He underwent Myoview scanning with an EF of 44% a small reversible apical defect without ischemia   . Echo in 2003 demonstrated EF of 60% with apical akinesis. Myoview January 2012 demonstrated an anteroapical MI no ischemia and an EF of 51%.      Past Medical History  Diagnosis Date  . Diverticulosis     on CT scan 2009  . Diverticulitis     on CT 2012  . Hyperlipidemia   . Coronary artery disease     s/p anterior MI in 2003 treated with a BMS to the LAD x2. He subsequently underwent DES placement to the RCA as well the LAD in January 2006.  Echo 9/03: Apical AK, EF 60%.  Last LHC 11/2010:  dLM 20, prox to mid LAD stents with mod ISR (40-50%), oD2 jailed (99% - non flow limiting), mCFX 30, OM2 30, pRCA stent ok with 20% ISR, EF 50-55%.  Myoview 11/2010: ant-apical MI, no isch, EF 51%, low risk;    . Hypertension   . Hx of cardiovascular stress test     ETT-Myoview (9/14):  Low risk, EF 44%, dAnt and apical scar, no ischemia    Past  Surgical History  Procedure Laterality Date  . Cholecystectomy    . Coronary angioplasty with stent placement      Dr.Stuckey  . Tonsillectomy    . Colonoscopy  2007    Negative recheck 2017  . Vasectomy      Current Outpatient Prescriptions  Medication Sig Dispense Refill  . aspirin 81 MG tablet Take 81 mg by mouth daily.        . clonazePAM (KLONOPIN) 0.5 MG tablet TAKE ONE(1) TABLET AT BEDTIME AS NEEDED.  90 tablet  0  . clopidogrel (PLAVIX) 75 MG tablet Take 1 tablet (75 mg total) by mouth daily.  90 tablet  3  . ezetimibe-simvastatin (VYTORIN) 10-20 MG per tablet Take 1 tablet by mouth at bedtime.  90 tablet  3  . mexiletine (MEXITIL) 150 MG capsule Take 1 capsule (150 mg total) by mouth 2 (two) times daily.  60 capsule  11  . Multiple Vitamin (MULTIVITAMIN) tablet Take 1 tablet by mouth daily.        . valsartan (DIOVAN) 80 MG tablet Take 1 tablet (80 mg total) by mouth daily.  90 tablet  1   No current facility-administered medications for this visit.    Allergies  Allergen Reactions  . Clams [Shellfish Allergy] Diarrhea and Nausea And Vomiting  . Iodine     Patient has never  had CT contrast to know if he is allergic to Iodine.  Only allergic to Clams and Morphine.  JWM 05/06/11:  Had Ct contrast today and did not have a reaction.  . Morphine     Heart races    Review of Systems negative except from HPI and PMH  Physical Exam BP 121/80  Pulse 62  Ht 5\' 4"  (1.626 m)  Wt 179 lb 12.8 oz (81.557 kg)  BMI 30.85 kg/m2 Well developed and well nourished in no acute distress HENT normal E scleral and icterus clear Neck Supple JVP flat; carotids brisk and full Clear to ausculation  Regular rate and rhythm, no murmurs gallops or rub Soft with active bowel sounds No clubbing cyanosis none Edema Alert and oriented, grossly normal motor and sensory function Skin Warm and Dry  Holter monitor dated 09/29/2013 demonstrated a minimum heart rate of 40 in maximum heart rate of  110 with a mean of 65. Total QRS 93,000 PVCs 3800 were various morphologies of ventricular ectopy.  Assessment and  Plan

## 2013-10-05 NOTE — Patient Instructions (Addendum)
Your physician has recommended you make the following change in your medication:  1) Stop Vytorin 2) Start Atorvastatin 80 mg daily 3) Stop Valsartan 4) Start Losartan 50 mg daily  Your physician has requested that you have an echocardiogram in 4 weeks. Echocardiography is a painless test that uses sound waves to create images of your heart. It provides your doctor with information about the size and shape of your heart and how well your heart's chambers and valves are working. This procedure takes approximately one hour. There are no restrictions for this procedure.  Your physician recommends that you schedule a follow-up appointment in: 6 weeks with Dr. Excell Seltzer.

## 2013-11-02 ENCOUNTER — Ambulatory Visit (HOSPITAL_COMMUNITY): Payer: BC Managed Care – PPO | Attending: Cardiology | Admitting: Radiology

## 2013-11-02 ENCOUNTER — Encounter: Payer: Self-pay | Admitting: Cardiology

## 2013-11-02 DIAGNOSIS — I519 Heart disease, unspecified: Secondary | ICD-10-CM

## 2013-11-02 DIAGNOSIS — I2510 Atherosclerotic heart disease of native coronary artery without angina pectoris: Secondary | ICD-10-CM | POA: Insufficient documentation

## 2013-11-02 DIAGNOSIS — I10 Essential (primary) hypertension: Secondary | ICD-10-CM | POA: Insufficient documentation

## 2013-11-02 DIAGNOSIS — I4949 Other premature depolarization: Secondary | ICD-10-CM | POA: Insufficient documentation

## 2013-11-02 DIAGNOSIS — I428 Other cardiomyopathies: Secondary | ICD-10-CM | POA: Insufficient documentation

## 2013-11-02 DIAGNOSIS — I25810 Atherosclerosis of coronary artery bypass graft(s) without angina pectoris: Secondary | ICD-10-CM

## 2013-11-02 DIAGNOSIS — E785 Hyperlipidemia, unspecified: Secondary | ICD-10-CM | POA: Insufficient documentation

## 2013-11-02 NOTE — Progress Notes (Signed)
Echocardiogram performed.  

## 2013-11-16 ENCOUNTER — Other Ambulatory Visit: Payer: Self-pay | Admitting: *Deleted

## 2013-11-16 DIAGNOSIS — I472 Ventricular tachycardia: Secondary | ICD-10-CM

## 2013-11-20 ENCOUNTER — Encounter (HOSPITAL_COMMUNITY): Payer: Self-pay | Admitting: Emergency Medicine

## 2013-11-20 ENCOUNTER — Emergency Department (HOSPITAL_COMMUNITY)
Admission: EM | Admit: 2013-11-20 | Discharge: 2013-11-21 | Disposition: A | Discharge status: Home / Self Care | Payer: BC Managed Care – PPO | Attending: Emergency Medicine | Admitting: Emergency Medicine

## 2013-11-20 DIAGNOSIS — Z79899 Other long term (current) drug therapy: Secondary | ICD-10-CM | POA: Insufficient documentation

## 2013-11-20 DIAGNOSIS — Y999 Unspecified external cause status: Secondary | ICD-10-CM | POA: Insufficient documentation

## 2013-11-20 DIAGNOSIS — Z951 Presence of aortocoronary bypass graft: Secondary | ICD-10-CM | POA: Insufficient documentation

## 2013-11-20 DIAGNOSIS — W19XXXA Unspecified fall, initial encounter: Secondary | ICD-10-CM

## 2013-11-20 DIAGNOSIS — E785 Hyperlipidemia, unspecified: Secondary | ICD-10-CM | POA: Insufficient documentation

## 2013-11-20 DIAGNOSIS — I10 Essential (primary) hypertension: Secondary | ICD-10-CM | POA: Insufficient documentation

## 2013-11-20 DIAGNOSIS — IMO0001 Reserved for inherently not codable concepts without codable children: Secondary | ICD-10-CM | POA: Insufficient documentation

## 2013-11-20 DIAGNOSIS — S99929A Unspecified injury of unspecified foot, initial encounter: Principal | ICD-10-CM

## 2013-11-20 DIAGNOSIS — S99919A Unspecified injury of unspecified ankle, initial encounter: Principal | ICD-10-CM

## 2013-11-20 DIAGNOSIS — S8992XA Unspecified injury of left lower leg, initial encounter: Secondary | ICD-10-CM

## 2013-11-20 DIAGNOSIS — S8990XA Unspecified injury of unspecified lower leg, initial encounter: Secondary | ICD-10-CM | POA: Insufficient documentation

## 2013-11-20 DIAGNOSIS — W108XXA Fall (on) (from) other stairs and steps, initial encounter: Secondary | ICD-10-CM | POA: Insufficient documentation

## 2013-11-20 DIAGNOSIS — Y939 Activity, unspecified: Secondary | ICD-10-CM | POA: Insufficient documentation

## 2013-11-20 DIAGNOSIS — Z7982 Long term (current) use of aspirin: Secondary | ICD-10-CM | POA: Insufficient documentation

## 2013-11-20 DIAGNOSIS — Z7902 Long term (current) use of antithrombotics/antiplatelets: Secondary | ICD-10-CM | POA: Insufficient documentation

## 2013-11-20 DIAGNOSIS — I2510 Atherosclerotic heart disease of native coronary artery without angina pectoris: Secondary | ICD-10-CM | POA: Insufficient documentation

## 2013-11-20 DIAGNOSIS — Y929 Unspecified place or not applicable: Secondary | ICD-10-CM | POA: Insufficient documentation

## 2013-11-20 NOTE — ED Notes (Addendum)
Pt was carrying a cat down the stairs and tripped on the seconded stair. Denies LOC or dizzness. Pt is wake and alert with complaints of left side pain.  Pt received 25mcg  of fentanyl via EMS prior to arrival.

## 2013-11-21 ENCOUNTER — Emergency Department (HOSPITAL_COMMUNITY): Payer: BC Managed Care – PPO

## 2013-11-21 MED ORDER — FENTANYL CITRATE 0.05 MG/ML IJ SOLN
100.0000 ug | Freq: Once | INTRAMUSCULAR | Status: AC
  Administered 2013-11-21: 100 ug via INTRAVENOUS
  Filled 2013-11-21: qty 2

## 2013-11-21 MED ORDER — ONDANSETRON HCL 4 MG/2ML IJ SOLN
4.0000 mg | Freq: Once | INTRAMUSCULAR | Status: AC
  Administered 2013-11-21: 4 mg via INTRAVENOUS
  Filled 2013-11-21: qty 2

## 2013-11-21 MED ORDER — HYDROMORPHONE HCL PF 1 MG/ML IJ SOLN
1.0000 mg | Freq: Once | INTRAMUSCULAR | Status: AC
  Administered 2013-11-21: 1 mg via INTRAVENOUS
  Filled 2013-11-21: qty 1

## 2013-11-21 MED ORDER — OXYCODONE-ACETAMINOPHEN 5-325 MG PO TABS: 2.0000 | ORAL_TABLET | ORAL | Status: DC | PRN

## 2013-11-21 NOTE — ED Provider Notes (Signed)
CSN: [id number]     Arrival date & time 11/20/13  2254 History   First MD Initiated Contact with Patient 11/21/13 0044     Chief Complaint  Patient presents with  . Fall   (Consider location/radiation/quality/duration/timing/severity/associated sxs/prior Treatment) HPI Comments: Patient is a 58 year old male who presents after a mechanical fall that occurred prior to arrival. Patient reports he was carrying his cat down the stairs when he missed a step and fell down the last 2 stairs. Since the fall, patient complains of left thigh pain. The pain is throbbing and severe and radiates to his left hip. Movement and palpation makes the pain worse. No alleviating factors. Patient denies chest pain, dizziness prior to the fall. Patient denies head trauma or LOC. Patient denies any other injury.   Patient is a 58 y.o. male presenting with fall.  Fall Associated symptoms include arthralgias and myalgias. Pertinent negatives include no abdominal pain, chest pain, chills, fatigue, fever, nausea, neck pain, vomiting or weakness.    Past Medical History  Diagnosis Date  . Diverticulosis     on CT scan 2009  . Diverticulitis     on CT 2012  . Hyperlipidemia   . Coronary artery disease     s/p anterior MI in 2003 treated with a BMS to the LAD x2. He subsequently underwent DES placement to the RCA as well the LAD in January 2006.  Echo 9/03: Apical AK, EF 60%.  Last LHC 11/2010:  dLM 20, prox to mid LAD stents with mod ISR (40-50%), oD2 jailed (99% - non flow limiting), mCFX 30, OM2 30, pRCA stent ok with 20% ISR, EF 50-55%.  Myoview 11/2010: ant-apical MI, no isch, EF 51%, low risk;    . Hypertension   . Hx of cardiovascular stress test     ETT-Myoview (9/14):  Low risk, EF 44%, dAnt and apical scar, no ischemia   Past Surgical History  Procedure Laterality Date  . Cholecystectomy    . Coronary angioplasty with stent placement      Dr.Stuckey  . Tonsillectomy    . Colonoscopy  2007    Negative  recheck 2017  . Vasectomy     Family History  Problem Relation Age of Onset  . Coronary artery disease Mother   . Skin cancer Maternal Uncle   . Diabetes      MGGM   History  Substance Use Topics  . Smoking status: Never Smoker   . Smokeless tobacco: Never Used  . Alcohol Use: Yes     Comment: 2-3 glasses of wine / month    Review of Systems  Constitutional: Negative for fever, chills and fatigue.  HENT: Negative for trouble swallowing.   Eyes: Negative for visual disturbance.  Respiratory: Negative for shortness of breath.   Cardiovascular: Negative for chest pain and palpitations.  Gastrointestinal: Negative for nausea, vomiting, abdominal pain and diarrhea.  Genitourinary: Negative for dysuria and difficulty urinating.  Musculoskeletal: Positive for arthralgias and myalgias. Negative for neck pain.  Skin: Negative for color change.  Neurological: Negative for dizziness and weakness.  Psychiatric/Behavioral: Negative for dysphoric mood.    Allergies  Clams; Iodine; and Morphine  Home Medications   Current Outpatient Rx  Name  Route  Sig  Dispense  Refill  . aspirin 81 MG tablet   Oral   Take 81 mg by mouth daily.           . clonazePAM (KLONOPIN) 0.5 MG tablet  TAKE ONE(1) TABLET AT BEDTIME AS NEEDED.   90 tablet   0   . clopidogrel (PLAVIX) 75 MG tablet   Oral   Take 1 tablet (75 mg total) by mouth daily.   90 tablet   3   . ezetimibe-simvastatin (VYTORIN) 10-20 MG per tablet   Oral   Take 1 tablet by mouth daily.         Marland Kitchen losartan (COZAAR) 50 MG tablet   Oral   Take 1 tablet (50 mg total) by mouth daily.   30 tablet   11   . mexiletine (MEXITIL) 150 MG capsule   Oral   Take 1 capsule (150 mg total) by mouth 2 (two) times daily.   60 capsule   11   . Multiple Vitamin (MULTIVITAMIN) tablet   Oral   Take 1 tablet by mouth daily.           Marland Kitchen atorvastatin (LIPITOR) 80 MG tablet   Oral   Take 1 tablet (80 mg total) by mouth  daily.   30 tablet   11    BP 111/69  Pulse 79  Temp(Src) 97.6 F (36.4 C) (Oral)  Resp 16  SpO2 98% Physical Exam  Nursing note and vitals reviewed. Constitutional: He is oriented to person, place, and time. He appears well-developed and well-nourished. No distress.  HENT:  Head: Normocephalic and atraumatic.  Eyes: Conjunctivae and EOM are normal.  Neck: Normal range of motion.  Cardiovascular: Normal rate, regular rhythm and intact distal pulses.  Exam reveals no gallop and no friction rub.   No murmur heard. Pulmonary/Chest: Effort normal and breath sounds normal. He has no wheezes. He has no rales. He exhibits no tenderness.  Abdominal: Soft. He exhibits no distension. There is no tenderness. There is no rebound.  No abdominal tenderness to palpation.   Musculoskeletal: Normal range of motion.  Left mid thigh tenderness to palpation. No obvious deformity. Limited ROM of left hip and left knee due to pain.   Neurological: He is alert and oriented to person, place, and time. Coordination normal.  Speech is goal-oriented. Distal left leg sensation intact.   Skin: Skin is warm and dry.  Psychiatric: He has a normal mood and affect. His behavior is normal.    ED Course  Procedures (including critical care time) Labs Review Labs Reviewed - No data to display Imaging Review Dg Pelvis 1-2 Views  11/21/2013   CLINICAL DATA:  Fall.  Pelvic pain.  EXAM: PELVIS - 1-2 VIEW  COMPARISON:  None.  FINDINGS: There is no evidence of pelvic fracture or diastasis. No other pelvic bone lesions are seen.  IMPRESSION: Negative.   Electronically Signed   By: Earle Gell M.D.   On: 11/21/2013 01:21   Dg Femur Left  11/21/2013   CLINICAL DATA:  Fall.  Left femur injury and pain.  EXAM: LEFT FEMUR - 2 VIEW  COMPARISON:  None.  FINDINGS: There is no evidence of fracture or other focal bone lesions. A small radiodensity is seen in the superficial soft tissues of the anterior distal thigh. This may  represent soft tissue calcification although a small radiopaque foreign body cannot be excluded.  IMPRESSION: No evidence of femur fracture.  Small radiodensity in superficial soft tissues of anterior distal thigh. Although this may represent soft tissue calcification, a small radiopaque foreign body cannot be excluded. Recommend clinical correlation for puncture wound or laceration in this region.   Electronically Signed   By: Earle Gell  M.D.   On: 11/21/2013 01:24    EKG Interpretation   None       MDM   1. Fall, initial encounter   2. Left leg injury, initial encounter     12:53 AM Left femur and left hip xray pending. Patient received fentanyl for pain. Intact distal pulses and sensation of left leg. Vitals stable and patient afebrile. Patient denies any other injury.   1:58 AM Xrays unremarkable for acute changes of the bones. Patient will have crutches for comfort and dilaudid before discharge. Patient will have prescription percocet for pain relief. No further evaluation needed at this time. Vitals stable and patient afebrile.     Alvina Chou, PA-C 11/21/13 [phone]

## 2013-11-21 NOTE — ED Provider Notes (Signed)
Medical screening examination/treatment/procedure(s) were performed by non-physician practitioner and as supervising physician I was immediately available for consultation/collaboration.    Kalman Drape, MD 11/21/13 [phone]

## 2013-11-21 NOTE — Discharge Instructions (Signed)
Take Percocet as needed for pain. Apply ice to your injury. Follow up with your doctor if symptoms do not improve. Return to the ED with worsening or concerning symptoms.

## 2013-11-26 ENCOUNTER — Encounter: Payer: Self-pay | Admitting: Cardiovascular Disease

## 2013-11-26 ENCOUNTER — Ambulatory Visit (INDEPENDENT_AMBULATORY_CARE_PROVIDER_SITE_OTHER): Payer: BC Managed Care – PPO | Admitting: Cardiovascular Disease

## 2013-11-26 VITALS — BP 123/78 | HR 61 | Ht 64.0 in | Wt 183.8 lb

## 2013-11-26 DIAGNOSIS — I2510 Atherosclerotic heart disease of native coronary artery without angina pectoris: Secondary | ICD-10-CM

## 2013-11-26 DIAGNOSIS — I10 Essential (primary) hypertension: Secondary | ICD-10-CM

## 2013-11-26 LAB — BASIC METABOLIC PANEL
BUN: 15 mg/dL (ref 6–23)
CHLORIDE: 106 meq/L (ref 96–112)
CO2: 27 mEq/L (ref 19–32)
Calcium: 9.3 mg/dL (ref 8.4–10.5)
Creatinine, Ser: 1 mg/dL (ref 0.4–1.5)
GFR: 86.01 mL/min (ref >60.00)
Glucose, Bld: 92 mg/dL (ref 70–99)
Potassium: 4.2 mEq/L (ref 3.5–5.1)
Sodium: 138 mEq/L (ref 135–145)

## 2013-11-26 NOTE — Patient Instructions (Signed)
Your physician recommends that you have lab work today in preparation for your MRI ([facility])  Your physician recommends that you continue on your current medications as directed. Please refer to the Current Medication list given to you today.  Your physician wants you to follow-up in: 6 months with Dr. Burt Knack.  You will receive a reminder letter in the mail two months in advance. If you don't receive a letter, please call our office to schedule the follow-up appointment.  Your physician has requested that you have a cardiac MRI. Cardiac MRI uses a computer to create images of your heart as its beating, producing both still and moving pictures of your heart and major blood vessels. For further information please visit [email or web address] Please follow the instruction sheet given to you today for more information. This is scheduled for Thurs. 1/29

## 2013-11-26 NOTE — Progress Notes (Signed)
HPI:  Zachary Stone presents for followup evaluation. He has coronary artery disease and has undergone multiple PCI procedures. He initially presented in 2003 with an anterior wall infarction. He underwent bare-metal stenting of the LAD. He has also undergone PCI of the RCA and a repeat PCI of the LAD. Because of chest pain and a decrease in his LV function, he underwent cardiac catheterization in October 2014 and this demonstrated continued patency of his RCA stent, moderate mid LAD stenosis with no change from his previous catheterizations, and a widely patent left circumflex. He was also noted to have moderate segmental LV systolic dysfunction. At that time the patient was started on mexiletine because of a high PVC burden. A Holter monitor after initiation of mexiletine demonstrated marked reduction in PVCs.  The patient is doing very well. He feels much better since being on mexiletine and states that his PVCs are less than they have ever been. He denies shortness of breath, lightheadedness, edema, or weakness. He injured his left thigh and hasn't been doing much exercise because of that. He has had occasional chest pains with no change in pattern.  Outpatient Encounter Prescriptions as of 11/26/2013  Medication Sig  . aspirin 81 MG tablet Take 81 mg by mouth daily.    Marland Kitchen atorvastatin (LIPITOR) 80 MG tablet Take 1 tablet (80 mg total) by mouth daily.  . clonazePAM (KLONOPIN) 0.5 MG tablet TAKE ONE(1) TABLET AT BEDTIME AS NEEDED.  Marland Kitchen clopidogrel (PLAVIX) 75 MG tablet Take 1 tablet (75 mg total) by mouth daily.  Marland Kitchen ezetimibe-simvastatin (VYTORIN) 10-20 MG per tablet Take 1 tablet by mouth daily.  Marland Kitchen losartan (COZAAR) 50 MG tablet Take 1 tablet (50 mg total) by mouth daily.  Marland Kitchen mexiletine (MEXITIL) 150 MG capsule Take 1 capsule (150 mg total) by mouth 2 (two) times daily.  . Multiple Vitamin (MULTIVITAMIN) tablet Take 1 tablet by mouth daily.    Marland Kitchen oxyCODONE-acetaminophen (PERCOCET/ROXICET) 5-325 MG per  tablet Take 2 tablets by mouth every 4 (four) hours as needed for severe pain.    Allergies  Allergen Reactions  . Clams [Shellfish Allergy] Diarrhea and Nausea And Vomiting  . Iodine     Patient has never had CT contrast to know if he is allergic to Iodine.  Only allergic to Clams and Morphine.  JWM 05/06/11:  Had Ct contrast today and did not have a reaction.  . Morphine     Heart races    Past Medical History  Diagnosis Date  . Diverticulosis     on CT scan 2009  . Diverticulitis     on CT 2012  . Hyperlipidemia   . Coronary artery disease     s/p anterior MI in 2003 treated with a BMS to the LAD x2. He subsequently underwent DES placement to the RCA as well the LAD in January 2006.  Echo 9/03: Apical AK, EF 60%.  Last LHC 11/2010:  dLM 20, prox to mid LAD stents with mod ISR (40-50%), oD2 jailed (99% - non flow limiting), mCFX 30, OM2 30, pRCA stent ok with 20% ISR, EF 50-55%.  Myoview 11/2010: ant-apical MI, no isch, EF 51%, low risk;    . Hypertension   . Hx of cardiovascular stress test     ETT-Myoview (9/14):  Low risk, EF 44%, dAnt and apical scar, no ischemia    ROS: Negative except as per HPI  BP 123/78  Pulse 61  Ht 5\' 4"  (1.626 m)  Wt 183 lb 12.8  oz (83.371 kg)  BMI 31.53 kg/m2  PHYSICAL EXAM: Pt is alert and oriented, NAD HEENT: normal Neck: JVP - normal, carotids 2+= without bruits Lungs: CTA bilaterally CV: RRR without murmur or gallop Abd: soft, NT, Positive BS, no hepatomegaly Ext: no C/C/E, distal pulses intact and equal Skin: warm/dry no rash  Cardiac catheterization 09/09/2013: AO 99/60  LV 93/12  Coronary angiography:  Coronary dominance: right  Left mainstem: Patent without significant stenosis. Divides into the LAD and LCx  Left anterior descending (LAD): The vessel is extensively stented. The proximal LAD has 30-40% stenosis prior to the stented segment. There is 50% stenosis after the first septal perforator. This area is unchanged from the  previous study and does not appear flow-limiting. The distal stent has mild diffuse ISR of 30-40%. The apical LAD is patent. The first diagonal has 50% ostial stenosis.  Left circumflex (LCx): Patent vessel with mild luminal irregularities. OM1 and OM2 are patent with luminal irregularity but no significant stenosis.  Right coronary artery (RCA): Dominant vessel. The proximal stent has no significant restenosis. The PDA is patent without significant disease.  Left ventriculography: Left ventricular systolic function is moderately reduced with apical akinesis and anterolateral hypokinesis. The LVEF is estimated at 35-40%.  Final Conclusions:  1. Continued wide patency of the RCA stent  2. Moderate mid-LAD stenosis unchanged from previous study  3. Patent LCx  4. Moderate segmental LV dysfunction  Recommendations: Discussed with Dr Caryl Comes. Pt with high PVC burden. No clear coronary explanation for worsening LV function. Will start mexilitine and repeat holter in 2 weeks to reassess PVC burden. Med Rx for CAD.   2-D echocardiogram 11/02/2013: Study Conclusions  - Left ventricle: The cavity size was normal. Wall thickness was normal. Systolic function was moderately reduced. The estimated ejection fraction was in the range of 35% to 40%. There is akinesis of the apical myocardium. Features are consistent with a pseudonormal left ventricular filling pattern, with concomitant abnormal relaxation and increased filling pressure (grade 2 diastolic dysfunction). - Aortic root: The aortic root was mildly dilated. - Mitral valve: Mild regurgitation. - Left atrium: The atrium was mildly dilated. Impressions:  - Compared to study of 08/26/13, LV function appears to be similar.  ASSESSMENT AND PLAN: 1. Cardiomyopathy. The patient's echocardiogram shows stable moderate LV dysfunction. This is despite significant reduction in his PVCs. He is going to undergo a cardiac MRI for further assessment. His  medicine regimen is stable and he will continue the same. No beta blocker because of significant bradycardia.  2. Coronary artery disease, native vessel. The patient is stable with occasional angina, CCS class II. Cardiac cath results reviewed again. No change in medical therapy. He remains on long-term dual antiplatelet therapy with aspirin and Plavix.  3. Symptomatic PVCs. Much better on mexiletine. Cardiac MRI pending.  4. Hyperlipidemia. The patient takes Vytorin. Lipids have been at goal with 2013 results showing a total cholesterol of 87, triglycerides 43, HDL 40, and LDL 38.  Sherren Mocha 11/26/2013 9:59 AM

## 2013-12-01 ENCOUNTER — Encounter: Payer: Self-pay | Admitting: Internal Medicine

## 2013-12-08 ENCOUNTER — Telehealth: Payer: Self-pay | Admitting: Cardiovascular Disease

## 2013-12-08 NOTE — Telephone Encounter (Signed)
New problem   Pt stated someone called him to go over his MRI but he couldn't understand name.

## 2013-12-08 NOTE — Telephone Encounter (Signed)
Spoke with patient and read instructions for patient's upcoming cardiac MRI scheduled for 1/29.  Patient verbalized understanding.

## 2013-12-14 ENCOUNTER — Ambulatory Visit (INDEPENDENT_AMBULATORY_CARE_PROVIDER_SITE_OTHER): Payer: BC Managed Care – PPO | Admitting: Internal Medicine

## 2013-12-14 ENCOUNTER — Encounter: Payer: Self-pay | Admitting: Internal Medicine

## 2013-12-14 VITALS — BP 101/61 | HR 68 | Temp 98.4°F | Wt 182.0 lb

## 2013-12-14 DIAGNOSIS — S46909A Unspecified injury of unspecified muscle, fascia and tendon at shoulder and upper arm level, unspecified arm, initial encounter: Secondary | ICD-10-CM

## 2013-12-14 DIAGNOSIS — S99919A Unspecified injury of unspecified ankle, initial encounter: Secondary | ICD-10-CM

## 2013-12-14 DIAGNOSIS — S8992XA Unspecified injury of left lower leg, initial encounter: Secondary | ICD-10-CM

## 2013-12-14 DIAGNOSIS — S8990XA Unspecified injury of unspecified lower leg, initial encounter: Secondary | ICD-10-CM

## 2013-12-14 DIAGNOSIS — S4980XA Other specified injuries of shoulder and upper arm, unspecified arm, initial encounter: Secondary | ICD-10-CM

## 2013-12-14 DIAGNOSIS — S99929A Unspecified injury of unspecified foot, initial encounter: Secondary | ICD-10-CM

## 2013-12-14 DIAGNOSIS — S4992XA Unspecified injury of left shoulder and upper arm, initial encounter: Secondary | ICD-10-CM

## 2013-12-14 NOTE — Progress Notes (Signed)
Pre visit review using our clinic review tool, if applicable. No additional management support is needed unless otherwise documented below in the visit note. 

## 2013-12-14 NOTE — Patient Instructions (Signed)
Use an anti-inflammatory cream such as Aspercreme or Zostrix cream twice a day to the affected area as needed. In lieu of this warm moist compresses or  hot water bottle can be used. Do not apply ice . 

## 2013-12-14 NOTE — Progress Notes (Signed)
   Subjective:    Patient ID: Zachary Stone, male    DOB: 09/13/1956, 58 y.o.   MRN: [id number]  HPI  When he was descending stairs carrying pet cat he mistepped striking corner of the wall against his left knee. He was seen in the emergency room 11/21/13; films of the pelvis and knees were negative. L shoulder also injured.No cardiac or neurologic prodrome prior to fall. Residual L knee but improved w/o treatment except icing.L shoulder pain unchanged but worse with elevation & posterior rotation of LUE. Character of pain is described as soreness. Severity of pain up to 4 level.  Radiation  of pain from knee to ankle. Duration of shoulder pain is 20 seconds with ROM ; L knee pain lasts 10 sec descending stairs.   Past medical history of injury to or surgery on the arm/leg:repetitive injury to shoulder due to swimming.           Review of Systems Negative symptoms /signs :  Redness Swelling Joint stiffness Skin color or temperature change Fever, chills, sweats, change in weight  Muscle cramps or pain Weakness Numbness Tingling Loss of control of bowels,urine: stool/urine)  Enlarged  Lymph nodes Abnormal bruising or bleeding       Objective:   Physical Exam   Gen.: Healthy and well-nourished in appearance. Alert, appropriate and cooperative throughout exam.  Neck: No deformities, masses, or tenderness noted. Range of motion slightly decreased.                            Musculoskeletal/extremities: No deformity or scoliosis noted of  the thoracic or lumbar spine. No clubbing, cyanosis, edema, or significant extremity  deformity noted. 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 Nail health good. Able to lie down & sit up w/o help. Negative SLR bilaterally. Slight crepitus L knee w/o effusion. Pain at left anterior shoulder with range of motion opposition.  Neurologic: Alert and oriented x3. Deep tendon reflexes symmetrical  and normal. .Gait including heel & toe walking normal.        Skin: Intact without suspicious lesions or rashes.Ecchymosis 3X2 cm L medial popliteal space Lymph: No cervical, axillary lymphadenopathy present. Psych: Mood and affect are normal. Normally interactive                                                                                     Assessment & Plan:  #1 blunt trauma left knee; no evidence of effusion or neuromuscular deficit  #2 blunt trauma with left anterior shoulder; tendinitis suggested.  Plan: See orders

## 2013-12-16 ENCOUNTER — Ambulatory Visit: Payer: BC Managed Care – PPO | Attending: Internal Medicine | Admitting: Physical Therapy

## 2013-12-16 ENCOUNTER — Ambulatory Visit (HOSPITAL_COMMUNITY): Admission: RE | Admit: 2013-12-16 | Payer: BC Managed Care – PPO | Source: Ambulatory Visit

## 2013-12-16 DIAGNOSIS — M25519 Pain in unspecified shoulder: Secondary | ICD-10-CM | POA: Insufficient documentation

## 2013-12-16 DIAGNOSIS — IMO0001 Reserved for inherently not codable concepts without codable children: Secondary | ICD-10-CM | POA: Insufficient documentation

## 2013-12-16 DIAGNOSIS — R609 Edema, unspecified: Secondary | ICD-10-CM | POA: Insufficient documentation

## 2013-12-16 DIAGNOSIS — M25569 Pain in unspecified knee: Secondary | ICD-10-CM | POA: Insufficient documentation

## 2013-12-20 ENCOUNTER — Ambulatory Visit: Payer: BC Managed Care – PPO | Attending: Internal Medicine | Admitting: Physical Therapy

## 2013-12-20 DIAGNOSIS — R609 Edema, unspecified: Secondary | ICD-10-CM | POA: Insufficient documentation

## 2013-12-20 DIAGNOSIS — M25519 Pain in unspecified shoulder: Secondary | ICD-10-CM | POA: Insufficient documentation

## 2013-12-20 DIAGNOSIS — M25569 Pain in unspecified knee: Secondary | ICD-10-CM | POA: Insufficient documentation

## 2013-12-20 DIAGNOSIS — IMO0001 Reserved for inherently not codable concepts without codable children: Secondary | ICD-10-CM | POA: Insufficient documentation

## 2013-12-23 ENCOUNTER — Encounter: Payer: Self-pay | Admitting: Internal Medicine

## 2013-12-24 ENCOUNTER — Ambulatory Visit: Payer: BC Managed Care – PPO | Admitting: Physical Therapy

## 2013-12-27 ENCOUNTER — Ambulatory Visit: Payer: BC Managed Care – PPO | Admitting: Physical Therapy

## 2013-12-31 ENCOUNTER — Ambulatory Visit: Payer: BC Managed Care – PPO | Admitting: Physical Therapy

## 2014-01-04 ENCOUNTER — Ambulatory Visit (HOSPITAL_COMMUNITY)
Admission: RE | Admit: 2014-01-04 | Discharge: 2014-01-04 | Disposition: A | Discharge status: Home / Self Care | Payer: BC Managed Care – PPO | Source: Ambulatory Visit | Attending: Internal Medicine | Admitting: Internal Medicine

## 2014-01-04 DIAGNOSIS — I472 Ventricular tachycardia, unspecified: Secondary | ICD-10-CM

## 2014-01-04 DIAGNOSIS — I519 Heart disease, unspecified: Secondary | ICD-10-CM | POA: Insufficient documentation

## 2014-01-04 DIAGNOSIS — I079 Rheumatic tricuspid valve disease, unspecified: Secondary | ICD-10-CM | POA: Insufficient documentation

## 2014-01-04 DIAGNOSIS — I4949 Other premature depolarization: Secondary | ICD-10-CM | POA: Insufficient documentation

## 2014-01-04 DIAGNOSIS — I2510 Atherosclerotic heart disease of native coronary artery without angina pectoris: Secondary | ICD-10-CM

## 2014-01-04 DIAGNOSIS — I252 Old myocardial infarction: Secondary | ICD-10-CM | POA: Insufficient documentation

## 2014-01-04 DIAGNOSIS — G259 Extrapyramidal and movement disorder, unspecified: Secondary | ICD-10-CM | POA: Insufficient documentation

## 2014-01-04 MED ORDER — GADOBENATE DIMEGLUMINE 529 MG/ML IV SOLN
25.0000 mL | Freq: Once | INTRAVENOUS | Status: AC
  Administered 2014-01-04: 25 mL via INTRAVENOUS

## 2014-01-14 ENCOUNTER — Encounter: Payer: Self-pay | Admitting: Physician Assistant

## 2014-01-14 ENCOUNTER — Ambulatory Visit (INDEPENDENT_AMBULATORY_CARE_PROVIDER_SITE_OTHER): Payer: BC Managed Care – PPO | Admitting: Physician Assistant

## 2014-01-14 VITALS — BP 116/76 | HR 82 | Temp 99.8°F | Resp 16 | Ht 64.0 in | Wt 180.0 lb

## 2014-01-14 DIAGNOSIS — J029 Acute pharyngitis, unspecified: Secondary | ICD-10-CM

## 2014-01-14 DIAGNOSIS — B9789 Other viral agents as the cause of diseases classified elsewhere: Principal | ICD-10-CM

## 2014-01-14 DIAGNOSIS — J069 Acute upper respiratory infection, unspecified: Secondary | ICD-10-CM

## 2014-01-14 DIAGNOSIS — R509 Fever, unspecified: Secondary | ICD-10-CM

## 2014-01-14 MED ORDER — BENZONATATE 200 MG PO CAPS: 200.0000 mg | ORAL_CAPSULE | Freq: Two times a day (BID) | ORAL | Status: DC | PRN

## 2014-01-14 NOTE — Patient Instructions (Signed)
Increase fluid intake.  Take Tessalon Perles as directed for cough.  Rest.  Use Saline nasal spray. Take a daily clatirin and a daily Multivitamin.  Take Plain Mucinex for congestion.  Place a humidifier in the bedroom.  Read information below on viral illnesses.  Please call or return to clinic if symptoms are not improving by Monday.  Viral Infections A virus is a type of germ. Viruses can cause:  Minor sore throats.  Aches and pains.  Headaches.  Runny nose.  Rashes.  Watery eyes.  Tiredness.  Coughs.  Loss of appetite.  Feeling sick to your stomach (nausea).  Throwing up (vomiting).  Watery poop (diarrhea). HOME CARE   Only take medicines as told by your doctor.  Drink enough water and fluids to keep your pee (urine) clear or pale yellow. Sports drinks are a good choice.  Get plenty of rest and eat healthy. Soups and broths with crackers or rice are fine. GET HELP RIGHT AWAY IF:   You have a very bad headache.  You have shortness of breath.  You have chest pain or neck pain.  You have an unusual rash.  You cannot stop throwing up.  You have watery poop that does not stop.  You cannot keep fluids down.  You or your child has a temperature by mouth above 102 F (38.9 C), not controlled by medicine.  Your baby is older than 3 months with a rectal temperature of 102 F (38.9 C) or higher.  Your baby is 51 months old or younger with a rectal temperature of 100.4 F (38 C) or higher. MAKE SURE YOU:   Understand these instructions.  Will watch this condition.  Will get help right away if you are not doing well or get worse. Document Released: 10/17/2008 Document Revised: 01/27/2012 Document Reviewed: 03/12/2011 [facility] Patient Information 2014 [place], [place].

## 2014-01-14 NOTE — Progress Notes (Signed)
Patient presents to clinic today c/o 2 days of dry cough, fever, fatigue, rhinorrhea and fatigue. Patient states he symptoms came on suddenly.  Denies muscle aches, chills, N/V/D.  Patient is febrile.  Denies pleuritic chest pain or shortness of breath.  Endorses some chest wall tenderness secondary to persistent cough.  Patient denies recent travel.  Endorses sick contact.  Is unsure of flu exposure.  Past Medical History  Diagnosis Date  . Diverticulosis     on CT scan 2009  . Diverticulitis     on CT 2012  . Hyperlipidemia   . Coronary artery disease     s/p anterior MI in 2003 treated with a BMS to the LAD x2. He subsequently underwent DES placement to the RCA as well the LAD in January 2006.  Echo 9/03: Apical AK, EF 60%.  Last LHC 11/2010:  dLM 20, prox to mid LAD stents with mod ISR (40-50%), oD2 jailed (99% - non flow limiting), mCFX 30, OM2 30, pRCA stent ok with 20% ISR, EF 50-55%.  Myoview 11/2010: ant-apical MI, no isch, EF 51%, low risk;    . Hypertension   . Hx of cardiovascular stress test     ETT-Myoview (9/14):  Low risk, EF 44%, dAnt and apical scar, no ischemia    Current Outpatient Prescriptions on File Prior to Visit  Medication Sig Dispense Refill  . aspirin 81 MG tablet Take 81 mg by mouth daily.        Marland Kitchen atorvastatin (LIPITOR) 80 MG tablet Take 1 tablet (80 mg total) by mouth daily.  30 tablet  11  . clonazePAM (KLONOPIN) 0.5 MG tablet TAKE ONE(1) TABLET AT BEDTIME AS NEEDED.  90 tablet  0  . clopidogrel (PLAVIX) 75 MG tablet Take 1 tablet (75 mg total) by mouth daily.  90 tablet  3  . ezetimibe-simvastatin (VYTORIN) 10-20 MG per tablet Take 1 tablet by mouth daily.      Marland Kitchen losartan (COZAAR) 50 MG tablet Take 1 tablet (50 mg total) by mouth daily.  30 tablet  11  . mexiletine (MEXITIL) 150 MG capsule Take 1 capsule (150 mg total) by mouth 2 (two) times daily.  60 capsule  11  . Multiple Vitamin (MULTIVITAMIN) tablet Take 1 tablet by mouth daily.         No current  facility-administered medications on file prior to visit.    Allergies  Allergen Reactions  . Clams [Shellfish Allergy] Diarrhea and Nausea And Vomiting  . Iodine     Patient has never had CT contrast to know if he is allergic to Iodine.  Only allergic to Clams and Morphine.  JWM 05/06/11:  Had Ct contrast today and did not have a reaction.  . Morphine     Heart races    Family History  Problem Relation Age of Onset  . Coronary artery disease Mother   . Skin cancer Maternal Uncle   . Diabetes      MGGM    History   Social History  . Marital Status: Married    Spouse Name: N/A    Number of Children: N/A  . Years of Education: N/A   Social History Main Topics  . Smoking status: Never Smoker   . Smokeless tobacco: Never Used  . Alcohol Use: Yes     Comment: 2-3 glasses of wine / month  . Drug Use: No  . Sexual Activity: None   Other Topics Concern  . None   Social History Narrative  .  None   Review of Systems - See HPI.  All other ROS are negative.  BP 116/76  Pulse 82  Temp(Src) 99.8 F (37.7 C) (Oral)  Resp 16  Ht 5\' 4"  (1.626 m)  Wt 180 lb (81.647 kg)  BMI 30.88 kg/m2  SpO2 95%  Physical Exam  Vitals reviewed. Constitutional: He is oriented to person, place, and time and well-developed, well-nourished, and in no distress.  HENT:  Head: Normocephalic and atraumatic.  Right Ear: External ear normal.  Left Ear: External ear normal.  Nose: Nose normal.  Mouth/Throat: Oropharynx is clear and moist. No oropharyngeal exudate.  TM within normal limits bilaterally.  No TTP of sinuses noted on examination.  Eyes: Conjunctivae are normal. Pupils are equal, round, and reactive to light.  Neck: Neck supple.  Cardiovascular: Normal rate, regular rhythm, normal heart sounds and intact distal pulses.   No murmur heard. Pulmonary/Chest: Effort normal and breath sounds normal. No respiratory distress. He has no wheezes. He has no rales. He exhibits no tenderness.   Lymphadenopathy:    He has no cervical adenopathy.  Neurological: He is alert and oriented to person, place, and time.  Skin: Skin is warm and dry. No rash noted.  Psychiatric: Affect normal.    Recent Results (from the past 2160 hour(s))  BASIC METABOLIC PANEL     Status: None   Collection Time    11/26/13 10:07 AM      Result Value Ref Range   Sodium 138  135 - 145 mEq/L   Potassium 4.2  3.5 - 5.1 mEq/L   Chloride 106  96 - 112 mEq/L   CO2 27  19 - 32 mEq/L   Glucose, Bld 92  70 - 99 mg/dL   BUN 15  6 - 23 mg/dL   Creatinine, Ser 1.0  0.4 - 1.5 mg/dL   Calcium 9.3  8.4 - 10.5 mg/dL   GFR 86.01  >60.00 mL/min   Assessment/Plan: Viral URI with cough Flu swab negative.  Symptoms consistent with viral etiology. Increase fluid intake.  Rest.  Saline nasal spray.  Daily claritin.  Humidifier in bedroom.  Rx Tessalon Perles for cough.  F/U if symptoms not improving by Monday.

## 2014-01-14 NOTE — Progress Notes (Signed)
Pre visit review using our clinic review tool, if applicable. No additional management support is needed unless otherwise documented below in the visit note/SLS  

## 2014-01-14 NOTE — Addendum Note (Signed)
Addended by: Rockwell Germany on: 01/14/2014 04:49 PM   Modules accepted: Orders

## 2014-01-14 NOTE — Assessment & Plan Note (Signed)
Flu swab negative.  Symptoms consistent with viral etiology. Increase fluid intake.  Rest.  Saline nasal spray.  Daily claritin.  Humidifier in bedroom.  Rx Tessalon Perles for cough.  F/U if symptoms not improving by Monday.

## 2014-02-14 ENCOUNTER — Telehealth: Payer: Self-pay | Admitting: Cardiovascular Disease

## 2014-02-14 ENCOUNTER — Other Ambulatory Visit: Payer: Self-pay | Admitting: *Deleted

## 2014-02-14 MED ORDER — CLOPIDOGREL BISULFATE 75 MG PO TABS: 75.0000 mg | ORAL_TABLET | Freq: Every day | ORAL | Status: DC

## 2014-02-14 NOTE — Telephone Encounter (Signed)
New problem   Pt need new prescription for Clopidotrel 75 mg. Pt is completely out and it says no refill left on bottle.

## 2014-02-14 NOTE — Telephone Encounter (Signed)
Per January office note with Dr. Burt Knack, patient is on long term dual antiplatelet therapy. Script for Plavix sent to patient's [facility] in [place].

## 2014-03-18 ENCOUNTER — Telehealth: Payer: Self-pay | Admitting: Cardiovascular Disease

## 2014-03-18 DIAGNOSIS — I10 Essential (primary) hypertension: Secondary | ICD-10-CM

## 2014-03-18 DIAGNOSIS — E7800 Pure hypercholesterolemia, unspecified: Secondary | ICD-10-CM

## 2014-03-18 DIAGNOSIS — Z79899 Other long term (current) drug therapy: Secondary | ICD-10-CM

## 2014-03-18 DIAGNOSIS — I2510 Atherosclerotic heart disease of native coronary artery without angina pectoris: Secondary | ICD-10-CM

## 2014-03-18 NOTE — Telephone Encounter (Signed)
Pt would like to have lab work done (lipid profile, LFT, BMET) one week prior appointment. Pt has an office visit on 06/10/14 at 8:45 AM with Dr. Burt Knack. Pt is aware that this message will be send to MD for recommendations.

## 2014-03-18 NOTE — Telephone Encounter (Signed)
New message         Pt would like to know if you want him to have blood drawn when he comes in for his 6 month f/u in July.

## 2014-03-18 NOTE — Telephone Encounter (Signed)
This is fine.-thx 

## 2014-03-18 NOTE — Telephone Encounter (Signed)
Pt is aware of lab appointment on 06/03/14.

## 2014-03-18 NOTE — Telephone Encounter (Signed)
Left pt a detail message; that Dr. Burt Knack agreed for him to get blood work a week prior his  office visit on 06/10/14. Labs orders are in EPIC. Appointment was made for pt on 06/03/14 in AM.

## 2014-06-03 ENCOUNTER — Other Ambulatory Visit (INDEPENDENT_AMBULATORY_CARE_PROVIDER_SITE_OTHER): Payer: BC Managed Care – PPO

## 2014-06-03 DIAGNOSIS — I10 Essential (primary) hypertension: Secondary | ICD-10-CM

## 2014-06-03 DIAGNOSIS — E7800 Pure hypercholesterolemia, unspecified: Secondary | ICD-10-CM

## 2014-06-03 DIAGNOSIS — Z79899 Other long term (current) drug therapy: Secondary | ICD-10-CM

## 2014-06-03 DIAGNOSIS — I2510 Atherosclerotic heart disease of native coronary artery without angina pectoris: Secondary | ICD-10-CM

## 2014-06-03 LAB — LIPID PANEL
CHOL/HDL RATIO: 2
Cholesterol: 95 mg/dL (ref 0–200)
HDL: 38.3 mg/dL — ABNORMAL LOW (ref >39.00)
LDL Cholesterol: 49 mg/dL (ref 0–99)
NONHDL: 56.7
Triglycerides: 38 mg/dL (ref 0.0–149.0)
VLDL: 7.6 mg/dL (ref 0.0–40.0)

## 2014-06-03 LAB — BASIC METABOLIC PANEL
BUN: 12 mg/dL (ref 6–23)
CHLORIDE: 105 meq/L (ref 96–112)
CO2: 31 meq/L (ref 19–32)
Calcium: 8.8 mg/dL (ref 8.4–10.5)
Creatinine, Ser: 0.9 mg/dL (ref 0.4–1.5)
GFR: 87.99 mL/min (ref >60.00)
Glucose, Bld: 91 mg/dL (ref 70–99)
Potassium: 3.5 mEq/L (ref 3.5–5.1)
SODIUM: 139 meq/L (ref 135–145)

## 2014-06-03 LAB — HEPATIC FUNCTION PANEL
ALBUMIN: 3.6 g/dL (ref 3.5–5.2)
ALT: 27 U/L (ref 0–53)
AST: 21 U/L (ref 0–37)
Alkaline Phosphatase: 67 U/L (ref 39–117)
Bilirubin, Direct: 0.2 mg/dL (ref 0.0–0.3)
Total Bilirubin: 1.2 mg/dL (ref 0.2–1.2)
Total Protein: 6.3 g/dL (ref 6.0–8.3)

## 2014-06-10 ENCOUNTER — Encounter: Payer: Self-pay | Admitting: Cardiovascular Disease

## 2014-06-10 ENCOUNTER — Ambulatory Visit (INDEPENDENT_AMBULATORY_CARE_PROVIDER_SITE_OTHER): Payer: BC Managed Care – PPO | Admitting: Cardiovascular Disease

## 2014-06-10 VITALS — BP 127/77 | HR 59 | Ht 64.0 in | Wt 186.8 lb

## 2014-06-10 DIAGNOSIS — I252 Old myocardial infarction: Secondary | ICD-10-CM

## 2014-06-10 NOTE — Patient Instructions (Signed)
Your physician recommends that you continue on your current medications as directed. Please refer to the Current Medication list given to you today.   Your physician wants you to follow-up in 1 year with Dr. Burt Knack.  You will receive a reminder letter in the mail two months in advance. If you don't receive a letter, please call our office to schedule the follow-up appointment @ [phone]   A copy of labs were given today at office visit

## 2014-06-10 NOTE — Progress Notes (Signed)
HPI:  Mr. Zachary Stone presents for followup evaluation. He is now 58 years old, followed for coronary artery disease with hx of multiple PCI procedures. He initially presented in 2003 with an anterior wall infarction. He underwent bare-metal stenting of the LAD. He has also undergone PCI of the RCA and a repeat PCI of the LAD. Because of chest pain and a decrease in his LV function, he underwent cardiac catheterization in October 2014 and this demonstrated continued patency of his RCA stent, moderate mid LAD stenosis with no change from his previous catheterizations, and a widely patent left circumflex. He was also noted to have moderate segmental LV systolic dysfunction. At that time the patient was started on mexiletine because of a high PVC burden. A Holter monitor after initiation of mexiletine demonstrated marked reduction in PVCs.  The patient is doing well. He has not been exercising routinely. He had an injury after falling down some stairs about 6 months ago. He feels like it's taken this long to recover from that. He denies chest pain, chest pressure, or shortness of breath. He's had no leg swelling, lightheadedness, or syncope. His palpitations have been markedly diminished since taking mexiletine.  Outpatient Encounter Prescriptions as of 06/10/2014  Medication Sig  . aspirin 81 MG tablet Take 81 mg by mouth daily.    Marland Kitchen atorvastatin (LIPITOR) 80 MG tablet Take 1 tablet (80 mg total) by mouth daily.  . clonazePAM (KLONOPIN) 0.5 MG tablet TAKE ONE(1) TABLET AT BEDTIME AS NEEDED.  Marland Kitchen clopidogrel (PLAVIX) 75 MG tablet Take 1 tablet (75 mg total) by mouth daily.  Marland Kitchen losartan (COZAAR) 50 MG tablet Take 1 tablet (50 mg total) by mouth daily.  Marland Kitchen mexiletine (MEXITIL) 150 MG capsule Take 1 capsule (150 mg total) by mouth 2 (two) times daily.  . Multiple Vitamin (MULTIVITAMIN) tablet Take 1 tablet by mouth daily.    . [DISCONTINUED] benzonatate (TESSALON) 200 MG capsule Take 1 capsule (200 mg total) by  mouth 2 (two) times daily as needed for cough.  . [DISCONTINUED] ezetimibe-simvastatin (VYTORIN) 10-20 MG per tablet Take 1 tablet by mouth daily.    Allergies  Allergen Reactions  . Clams [Shellfish Allergy] Diarrhea and Nausea And Vomiting  . Iodine     Patient has never had CT contrast to know if he is allergic to Iodine.  Only allergic to Clams and Morphine.  JWM 05/06/11:  Had Ct contrast today and did not have a reaction.  . Morphine     Heart races    Past Medical History  Diagnosis Date  . Diverticulosis     on CT scan 2009  . Diverticulitis     on CT 2012  . Hyperlipidemia   . Coronary artery disease     s/p anterior MI in 2003 treated with a BMS to the LAD x2. He subsequently underwent DES placement to the RCA as well the LAD in January 2006.  Echo 9/03: Apical AK, EF 60%.  Last LHC 11/2010:  dLM 20, prox to mid LAD stents with mod ISR (40-50%), oD2 jailed (99% - non flow limiting), mCFX 30, OM2 30, pRCA stent ok with 20% ISR, EF 50-55%.  Myoview 11/2010: ant-apical MI, no isch, EF 51%, low risk;    . Hypertension   . Hx of cardiovascular stress test     ETT-Myoview (9/14):  Low risk, EF 44%, dAnt and apical scar, no ischemia    ROS: Negative except as per HPI  BP 127/77  Pulse 59  Ht 5\' 4"  (1.626 m)  Wt 186 lb 12.8 oz (84.732 kg)  BMI 32.05 kg/m2  PHYSICAL EXAM: Pt is alert and oriented, NAD HEENT: normal Neck: JVP - normal, carotids 2+= without bruits Lungs: CTA bilaterally CV: RRR without murmur or gallop Abd: soft, NT, Positive BS, no hepatomegaly Ext: no C/C/E, distal pulses intact and equal Skin: warm/dry no rash  EKG:  Sinus rhythm 59 beats per minute, occasional PVC, inferior MI age undetermined, anteroseptal MI age undetermined.  Cardiac MRI 01/04/2014: IMPRESSION:  1. There is moderate LV systolic dysfunction (LVEF 41%) with  evidence of prior infarct in the mid LAD and distal PDA territory  with poor prognosis for recovery.  Regional wall motion  abnormalities as described above with a flow  stasis in the apical portion of the ventricle. NO obvious thrombus.  2. Normal RV size and function, no evidence of ARVC.  Ena Dawley  Lipids: Lipid Panel     Component Value Date/Time   CHOL 95 06/03/2014 0739   TRIG 38.0 06/03/2014 0739   HDL 38.30* 06/03/2014 0739   CHOLHDL 2 06/03/2014 0739   VLDL 7.6 06/03/2014 0739   LDLCALC 49 06/03/2014 0739   ASSESSMENT AND PLAN: 1. Ischemic cardiomyopathy. The patient has moderate LV dysfunction by cardiac MRI. He is managed with losartan. He is unable to take beta blockers because of marked bradycardia. The patient is New [place] Heart Association functional class I. I will see him back in one year for followup.  2. Coronary artery disease with old MI. He is treated with aspirin and atorvastatin. He takes Plavix after multiple PCI procedures. He's having no anginal symptoms, CCS class I.  3. Hyperlipidemia. Recent lipids reviewed as above. The patient was given a copy of his labs. He remains on high-dose atorvastatin.  4. 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 He history with mexiletine with excellent symptomatic improvement.  For followup I will see him back in one year.  Sherren Mocha 06/10/2014 9:11 AM

## 2014-07-29 ENCOUNTER — Other Ambulatory Visit: Payer: Self-pay | Admitting: *Deleted

## 2014-07-29 MED ORDER — MEXILETINE HCL 150 MG PO CAPS: 150.0000 mg | ORAL_CAPSULE | Freq: Two times a day (BID) | ORAL | Status: DC

## 2014-08-17 ENCOUNTER — Other Ambulatory Visit: Payer: Self-pay

## 2014-08-17 MED ORDER — LOSARTAN POTASSIUM 50 MG PO TABS: 50.0000 mg | ORAL_TABLET | Freq: Every day | ORAL | Status: DC

## 2014-08-25 ENCOUNTER — Other Ambulatory Visit: Payer: Self-pay | Admitting: *Deleted

## 2014-08-25 MED ORDER — ATORVASTATIN CALCIUM 80 MG PO TABS: 80.0000 mg | ORAL_TABLET | Freq: Every day | ORAL | Status: DC

## 2014-09-13 ENCOUNTER — Encounter: Payer: Self-pay | Admitting: Gastroenterology

## 2014-10-25 ENCOUNTER — Ambulatory Visit: Payer: BC Managed Care – PPO | Admitting: Family Medicine

## 2014-10-27 ENCOUNTER — Encounter (HOSPITAL_COMMUNITY): Payer: Self-pay | Admitting: Cardiovascular Disease

## 2014-11-03 ENCOUNTER — Other Ambulatory Visit: Payer: Self-pay | Admitting: *Deleted

## 2014-11-03 MED ORDER — MEXILETINE HCL 150 MG PO CAPS
150.0000 mg | ORAL_CAPSULE | Freq: Two times a day (BID) | ORAL | Status: DC
Start: 2014-11-03 — End: 2015-01-27

## 2015-01-25 ENCOUNTER — Other Ambulatory Visit: Payer: Self-pay | Admitting: Cardiovascular Disease

## 2015-01-27 ENCOUNTER — Other Ambulatory Visit: Payer: Self-pay | Admitting: *Deleted

## 2015-01-27 MED ORDER — MEXILETINE HCL 150 MG PO CAPS: 150.0000 mg | ORAL_CAPSULE | Freq: Two times a day (BID) | ORAL | Status: DC

## 2015-01-27 MED ORDER — ATORVASTATIN CALCIUM 80 MG PO TABS: 80.0000 mg | ORAL_TABLET | Freq: Every day | ORAL | Status: DC

## 2015-04-11 ENCOUNTER — Other Ambulatory Visit: Payer: Self-pay | Admitting: *Deleted

## 2015-04-11 MED ORDER — MEXILETINE HCL 150 MG PO CAPS: 150.0000 mg | ORAL_CAPSULE | Freq: Two times a day (BID) | ORAL | Status: DC

## 2015-05-29 ENCOUNTER — Other Ambulatory Visit: Payer: Self-pay | Admitting: Cardiovascular Disease

## 2015-05-29 MED ORDER — ATORVASTATIN CALCIUM 80 MG PO TABS: 80.0000 mg | ORAL_TABLET | Freq: Every day | ORAL | Status: DC

## 2015-05-31 ENCOUNTER — Other Ambulatory Visit: Payer: Self-pay | Admitting: *Deleted

## 2015-05-31 MED ORDER — LOSARTAN POTASSIUM 50 MG PO TABS: 50.0000 mg | ORAL_TABLET | Freq: Every day | ORAL | Status: DC

## 2015-06-08 ENCOUNTER — Telehealth: Payer: Self-pay

## 2015-06-08 NOTE — Telephone Encounter (Signed)
Called pharmacy to make sure patient has enough Losartan 50 mg to carry him through his office visit 08-10-15.

## 2015-06-15 ENCOUNTER — Other Ambulatory Visit: Payer: Self-pay

## 2015-06-15 MED ORDER — MEXILETINE HCL 150 MG PO CAPS: 150.0000 mg | ORAL_CAPSULE | Freq: Two times a day (BID) | ORAL | Status: DC

## 2015-07-04 ENCOUNTER — Telehealth: Payer: Self-pay

## 2015-07-04 NOTE — Telephone Encounter (Signed)
Approved      Disp Refills Start End    clopidogrel (PLAVIX) 75 MG tablet 90 tablet 1 01/25/2015     Sig:  TAKE 1 TABLET BY MOUTH DAILY    Class:  Normal    DAW:  No    Authorizing Provider:  Sherren Mocha, MD    Ordering User:  Carey called in about Plavix refill--explained it was refilled untill September--pharmacist said ok and that he would like a call back from the Nurse  [place]

## 2015-07-04 NOTE — Telephone Encounter (Signed)
I spoke with Carmell Austria the pharmacist and the pt will run out of his current supply of plavix on 08/08/15.  The pt has a pending appointment on 08/10/15 with Dr Burt Knack. Per Carmell Austria the [facility] system shows that the pt's plavix does have an additional 3 refills remaining.  Carmell Austria cannot find any documentation in her system as to who called our office.

## 2015-07-06 ENCOUNTER — Other Ambulatory Visit: Payer: Self-pay

## 2015-07-06 MED ORDER — CLOPIDOGREL BISULFATE 75 MG PO TABS
75.0000 mg | ORAL_TABLET | Freq: Every day | ORAL | Status: DC
Start: 2015-07-06 — End: 2015-08-29

## 2015-08-01 ENCOUNTER — Other Ambulatory Visit: Payer: Self-pay

## 2015-08-01 NOTE — Telephone Encounter (Signed)
Approved      Disp Refills Start End    clopidogrel (PLAVIX) 75 MG tablet 30 tablet 1 07/06/2015     Sig - Route:  Take 1 tablet (75 mg total) by mouth daily. - Oral    Class:  Normal    DAW:  No    Authorizing Provider:  Sherren Mocha, MD    Ordering User:  Velna Ochs, [place], [place]

## 2015-08-04 ENCOUNTER — Other Ambulatory Visit: Payer: Self-pay

## 2015-08-10 ENCOUNTER — Encounter: Payer: Self-pay | Admitting: Cardiovascular Disease

## 2015-08-10 ENCOUNTER — Ambulatory Visit (INDEPENDENT_AMBULATORY_CARE_PROVIDER_SITE_OTHER): Payer: BLUE CROSS/BLUE SHIELD | Admitting: Cardiovascular Disease

## 2015-08-10 VITALS — BP 110/74 | HR 52 | Ht 64.25 in | Wt 194.0 lb

## 2015-08-10 DIAGNOSIS — I5022 Chronic systolic (congestive) heart failure: Secondary | ICD-10-CM | POA: Diagnosis not present

## 2015-08-10 DIAGNOSIS — R001 Bradycardia, unspecified: Secondary | ICD-10-CM

## 2015-08-10 DIAGNOSIS — I252 Old myocardial infarction: Secondary | ICD-10-CM

## 2015-08-10 DIAGNOSIS — R000 Tachycardia, unspecified: Secondary | ICD-10-CM | POA: Diagnosis not present

## 2015-08-10 DIAGNOSIS — I43 Cardiomyopathy in diseases classified elsewhere: Secondary | ICD-10-CM

## 2015-08-10 MED ORDER — MEXILETINE HCL 150 MG PO CAPS: 150.0000 mg | ORAL_CAPSULE | Freq: Two times a day (BID) | ORAL | Status: DC

## 2015-08-10 NOTE — Patient Instructions (Addendum)
Medication Instructions:  Your physician recommends that you continue on your current medications as directed. Please refer to the Current Medication list given to you today.    Labwork: NONE ORDER TODAY    Testing/Procedures: NONE ORDER TODAY    Follow-Up:  Your physician wants you to follow-up in: ONE YEAR WITH DR COOPER  You will receive a reminder letter in the mail two months in advance. If you don't receive a letter, please call our office to schedule the follow-up appointment.    Any Other Special Instructions Will Be Listed Below (If Applicable).           

## 2015-08-10 NOTE — Progress Notes (Signed)
Cardiology Office Note Date:  08/10/2015   ID:  Zachary Stone, DOB 1956/08/24, MRN [id number]  PCP:  Unice Cobble, MD  Cardiologist:  Sherren Mocha, MD    Chief Complaint  Patient presents with  . Coronary Artery Disease    History of Present Illness: Zachary Stone is a 59 y.o. male who presents for follow-up of CAD. The patient has undergone multiple PCI procedures. He initially presented in 2003 with an anterior wall infarction. He underwent bare-metal stenting of the LAD. He has also undergone PCI of the RCA and a repeat PCI of the LAD. Because of chest pain and a decrease in his LV function, he underwent cardiac catheterization in October 2014 and this demonstrated continued patency of his RCA stent, moderate mid LAD stenosis with no change from his previous catheterizations, and a widely patent left circumflex. He was also noted to have moderate segmental LV systolic dysfunction. At that time the patient was started on mexiletine because of a high PVC burden. A Holter monitor after initiation of mexiletine demonstrated marked reduction in PVCs.  He is physically active. Uses a rowing machine 4 days/week for 40 minutes. No exertional symptoms. Denies chest pain or pressure, dyspnea, or edema. No palpitations. Continues on Mexilitine for PVC suppression. 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 He is having no problems at present.  Past Medical History  Diagnosis Date  . Diverticulosis     on CT scan 2009  . Diverticulitis     on CT 2012  . Hyperlipidemia   . Coronary artery disease     s/p anterior MI in 2003 treated with a BMS to the LAD x2. He subsequently underwent DES placement to the RCA as well the LAD in January 2006.  Echo 9/03: Apical AK, EF 60%.  Last LHC 11/2010:  dLM 20, prox to mid LAD stents with mod ISR (40-50%), oD2 jailed (99% - non flow limiting), mCFX 30, OM2 30, pRCA stent ok with 20% ISR, EF 50-55%.  Myoview 11/2010: ant-apical MI,  no isch, EF 51%, low risk;    . Hypertension   . Hx of cardiovascular stress test     ETT-Myoview (9/14):  Low risk, EF 44%, dAnt and apical scar, no ischemia    Past Surgical History  Procedure Laterality Date  . Cholecystectomy    . Coronary angioplasty with stent placement      Dr.Stuckey  . Tonsillectomy    . Colonoscopy  2007    Negative recheck 2017  . Vasectomy    . Left heart catheterization with coronary angiogram N/A 09/09/2013    Procedure: LEFT HEART CATHETERIZATION WITH CORONARY ANGIOGRAM;  Surgeon: Blane Ohara, MD;  Location: [facility] CATH LAB;  Service: Cardiovascular;  Laterality: N/A;    Current Outpatient Prescriptions  Medication Sig Dispense Refill  . aspirin 81 MG tablet Take 81 mg by mouth daily.      Marland Kitchen atorvastatin (LIPITOR) 80 MG tablet Take 1 tablet (80 mg total) by mouth daily. 90 tablet 0  . clopidogrel (PLAVIX) 75 MG tablet Take 1 tablet (75 mg total) by mouth daily. 30 tablet 1  . losartan (COZAAR) 50 MG tablet Take 1 tablet (50 mg total) by mouth daily. 90 tablet 0  . mexiletine (MEXITIL) 150 MG capsule Take 1 capsule (150 mg total) by mouth 2 (two) times daily. 180 capsule 4  . Multiple Vitamin (MULTIVITAMIN) tablet Take 1 tablet by mouth daily.      Marland Kitchen  terazosin (HYTRIN) 2 MG capsule Take 1 capsule by mouth daily.  2   No current facility-administered medications for this visit.    Allergies:   Clams; Iodine; and Morphine   Social History:  The patient  reports that he has never smoked. He has never used smokeless tobacco. He reports that he drinks alcohol. He reports that he does not use illicit drugs.   Family History:  The patient's  family history includes Coronary artery disease in his mother; Diabetes in an other family member; Skin cancer in his maternal uncle.    ROS:  Please see the history of present illness.  Otherwise, review of systems is positive for snoring.  All other systems are reviewed and negative.    PHYSICAL EXAM: VS:  BP  110/74 mmHg  Pulse 52  Ht 5' 4.25" (1.632 m)  Wt 194 lb (87.998 kg)  BMI 33.04 kg/m2 , BMI Body mass index is 33.04 kg/(m^2). GEN: Well nourished, well developed, in no acute distress HEENT: normal Neck: no JVD, no masses. No carotid bruits Cardiac: RRR without murmur or gallop                Respiratory:  clear to auscultation bilaterally, normal work of breathing GI: soft, nontender, nondistended, + BS MS: no deformity or atrophy Ext: no pretibial edema, pedal pulses 2+= bilaterally Skin: warm and dry, no rash Neuro:  Strength and sensation are intact Psych: euthymic mood, full affect  EKG:  EKG is ordered today. The ekg ordered today shows  Sinus bradycardia 52 bpm, age-indeterminate anteroseptal infarct, age indeterminate inferior infarct.  Recent Labs: No results found for requested labs within last 365 days.   Lipid Panel     Component Value Date/Time   CHOL 95 06/03/2014 0739   TRIG 38.0 06/03/2014 0739   HDL 38.30* 06/03/2014 0739   CHOLHDL 2 06/03/2014 0739   VLDL 7.6 06/03/2014 0739   LDLCALC 49 06/03/2014 0739      Wt Readings from Last 3 Encounters:  08/10/15 194 lb (87.998 kg)  06/10/14 186 lb 12.8 oz (84.732 kg)  01/14/14 180 lb (81.647 kg)     Cardiac Studies Reviewed: Cardiac MRI  01/04/2014: FINDINGS: 1. Left ventricle has normal size and thickness with moderately reduced left ventricular function (LVEF 41%). There is akinesis of the apical anterior and inferior walls, mid antero and inferoseptal walls, dyskinesis of the apical septal wall and true apex and hypokinesis of the mid anterior wall. No obvious thrombus but flow stasis was seen in the LV apex.  The measurements are as follows:  LVEDV: 188 ml  LVESV: 111 ml  SV 77 ml  CO: 5.5 ml/min  Myo mass: 146 g  2. There is late gadolinium enhancement in the following territories:  Apical anterior wall: 50-75% (poor prognosis after revascularization)  Apical septal  wall: 75-100 % (poor prognosis)  Apical inferior wall: 50-75% (poor prognosis)  True apex: 75-100 % (poor prognosis)  Mid anterior: 0-25% (good prognosis)  Mid anteroseptal wall: 75-100% (poor prognosis)  Mid inferoseptal: 50-75% (fair prognosis)  3. Right ventricle has normal size, thickness and systolic function (RVEF 37%) with no regional wall motion abnormalities.  The measurements are as follows:  RVEDV: 158 ml  RVESV: 80 ml  SV 78 ml  RVCO: 5.6 ml/min  4. Trace mitral regurgitation, mild tricuspid regurgitation.  5. Mildly dilated left atrium. PA = 29 mm, Aortic root 40 mm, STJ 27 mm, ascending aorta 30 mm  IMPRESSION: 1. There is  moderate LV systolic dysfunction (LVEF 41%) with evidence of prior infarct in the mid LAD and distal PDA territory with poor prognosis for recovery.  Regional wall motion abnormalities as described above with a flow stasis in the apical portion of the ventricle. NO obvious thrombus.  2. Normal RV size and function, no evidence of ARVC.   ASSESSMENT AND PLAN: 1.  CAD, native vessel, with old MI:  The patient is stable without symptoms of angina. He will continue on his current medical program. He remains on long-term dual antiplatelets therapy after multiple stents have been placed. He reports no bleeding problems.  2. Chronic systolic heart failure secondary to underlying ischemic cardiomyopathy: The patient has been intolerant to beta blockers because of marked bradycardia. He has been managed with an ARB and has not had congestive symptoms requiring diuretic therapy.  3. Hyperlipidemia:  Lipids now followed by his primary physician. He just had labs 2 weeks ago. I reviewed last years blood work and his lipids have been at goal. Discussed increasing exercise and working on lifestyle modification.  4. Symptomatic PVCs:  Stable with no evidence of recurrence. Mexiletine prescription renewed.   Current  medicines are reviewed with the patient today.  The patient does not have concerns regarding medicines.  Labs/ tests ordered today include:   Orders Placed This Encounter  Procedures  . EKG 12-Lead    Disposition:   FU one year  Signed, Sherren Mocha, MD  08/10/2015 1:16 PM    [address], [place], [place]  [address] Phone: [phone]; Fax: [phone]

## 2015-08-22 ENCOUNTER — Other Ambulatory Visit: Payer: Self-pay | Admitting: *Deleted

## 2015-08-22 MED ORDER — ATORVASTATIN CALCIUM 80 MG PO TABS: 80.0000 mg | ORAL_TABLET | Freq: Every day | ORAL | Status: DC

## 2015-08-28 ENCOUNTER — Telehealth: Payer: Self-pay | Admitting: Pediatrics

## 2015-08-28 ENCOUNTER — Telehealth: Payer: Self-pay | Admitting: Cardiovascular Disease

## 2015-08-28 NOTE — Telephone Encounter (Signed)
Error

## 2015-08-28 NOTE — Telephone Encounter (Signed)
New Message      Pharmacy calling with questions about pt's prescriptions. Please call back and advise.

## 2015-08-28 NOTE — Telephone Encounter (Signed)
I spoke with the pharmacy and they requested a refill on the pt's plavix. Order given for #30 and 11 additional refills.

## 2015-08-29 ENCOUNTER — Other Ambulatory Visit: Payer: Self-pay | Admitting: Cardiovascular Disease

## 2015-08-29 MED ORDER — CLOPIDOGREL BISULFATE 75 MG PO TABS: 75.0000 mg | ORAL_TABLET | Freq: Every day | ORAL | Status: DC

## 2015-10-31 ENCOUNTER — Telehealth: Payer: Self-pay

## 2015-10-31 NOTE — Telephone Encounter (Signed)
Left Voice Mail for pt to call back.   RE: Flu Vaccine for 2016  

## 2015-11-06 ENCOUNTER — Other Ambulatory Visit: Payer: Self-pay | Admitting: *Deleted

## 2015-11-06 MED ORDER — LOSARTAN POTASSIUM 50 MG PO TABS: 50.0000 mg | ORAL_TABLET | Freq: Every day | ORAL | Status: DC

## 2016-01-19 ENCOUNTER — Other Ambulatory Visit: Payer: Self-pay | Admitting: *Deleted

## 2016-01-19 MED ORDER — CLOPIDOGREL BISULFATE 75 MG PO TABS: 75.0000 mg | ORAL_TABLET | Freq: Every day | ORAL | Status: DC

## 2016-06-22 ENCOUNTER — Other Ambulatory Visit: Payer: Self-pay | Admitting: Cardiovascular Disease

## 2016-07-18 ENCOUNTER — Other Ambulatory Visit: Payer: Self-pay | Admitting: Cardiovascular Disease

## 2016-09-12 ENCOUNTER — Ambulatory Visit: Payer: BLUE CROSS/BLUE SHIELD | Admitting: Cardiovascular Disease

## 2016-09-27 ENCOUNTER — Encounter (INDEPENDENT_AMBULATORY_CARE_PROVIDER_SITE_OTHER): Payer: Self-pay

## 2016-09-27 ENCOUNTER — Encounter: Payer: Self-pay | Admitting: Cardiovascular Disease

## 2016-09-27 ENCOUNTER — Ambulatory Visit (INDEPENDENT_AMBULATORY_CARE_PROVIDER_SITE_OTHER): Payer: BLUE CROSS/BLUE SHIELD | Admitting: Cardiovascular Disease

## 2016-09-27 VITALS — BP 120/70 | HR 54 | Ht 65.0 in | Wt 177.2 lb

## 2016-09-27 DIAGNOSIS — I2510 Atherosclerotic heart disease of native coronary artery without angina pectoris: Secondary | ICD-10-CM | POA: Diagnosis not present

## 2016-09-27 DIAGNOSIS — I252 Old myocardial infarction: Secondary | ICD-10-CM

## 2016-09-27 NOTE — Progress Notes (Signed)
Cardiology Office Note Date:  09/27/2016   ID:  Zachary Stone, DOB 1956/06/09, MRN [id number]  PCP:  Smothers, Andree Elk, NP  Cardiologist:  Zachary Mocha, MD    Chief Complaint  Patient presents with  . Coronary Artery Disease   History of Present Illness: Zachary Stone is a 60 y.o. male who presents for follow-up of CAD. The patient has undergone multiple PCI procedures. He initially presented in 2003 with an anterior wall infarction. He underwent bare-metal stenting of the LAD. He has also undergone PCI of the RCA and a repeat PCI of the LAD. Because of chest pain and a decrease in his LV function, he underwent cardiac catheterization in 2014 and this demonstrated continued patency of his RCA stent, moderate mid LAD stenosis with no change from his previous catheterizations, and a widely patent left circumflex. He was also noted to have moderate segmental LV systolic dysfunction. At that time the patient was started on mexiletine because of a high PVC burden. A Holter monitor after initiation of mexiletine demonstrated marked reduction in PVCs.  States he's having more 'twinges' than he was having last year. These occur on the left side. No association with physical activity and last less than 20 seconds. No dyspnea, fatigue, orthopnea, PND. Few palpitations but less than in the past. He feels mexilitine has worked very well for him.   He had gained 20# but has been able to lose weight again with diet and exercise. He feels much better since getting his weight back off. Using a rowing machine 3x/week without exertional symptoms.  Past Medical History:  Diagnosis Date  . Coronary artery disease    s/p anterior MI in 2003 treated with a BMS to the LAD x2. He subsequently underwent DES placement to the RCA as well the LAD in January 2006.  Echo 9/03: Apical AK, EF 60%.  Last LHC 11/2010:  dLM 20, prox to mid LAD stents with mod ISR (40-50%), oD2 jailed (99% - non flow limiting), mCFX 30, OM2  30, pRCA stent ok with 20% ISR, EF 50-55%.  Myoview 11/2010: ant-apical MI, no isch, EF 51%, low risk;    . Diverticulitis    on CT 2012  . Diverticulosis    on CT scan 2009  . Hx of cardiovascular stress test    ETT-Myoview (9/14):  Low risk, EF 44%, dAnt and apical scar, no ischemia  . Hyperlipidemia   . Hypertension     Past Surgical History:  Procedure Laterality Date  . CHOLECYSTECTOMY    . COLONOSCOPY  2007   Negative recheck 2017  . CORONARY ANGIOPLASTY WITH STENT PLACEMENT     Dr.Stuckey  . LEFT HEART CATHETERIZATION WITH CORONARY ANGIOGRAM N/A 09/09/2013   Procedure: LEFT HEART CATHETERIZATION WITH CORONARY ANGIOGRAM;  Surgeon: Zachary Ohara, MD;  Location: [facility] CATH LAB;  Service: Cardiovascular;  Laterality: N/A;  . TONSILLECTOMY    . VASECTOMY      Current Outpatient Prescriptions  Medication Sig Dispense Refill  . aspirin 81 MG tablet Take 81 mg by mouth daily.      Marland Kitchen atorvastatin (LIPITOR) 80 MG tablet Take 1 tablet (80 mg total) by mouth daily. 90 tablet 0  . clopidogrel (PLAVIX) 75 MG tablet Take 1 tablet (75 mg total) by mouth daily. 30 tablet 6  . losartan (COZAAR) 50 MG tablet Take 1 tablet (50 mg total) by mouth daily. 90 tablet 0  . mexiletine (MEXITIL) 150 MG capsule Take 1 capsule by mouth twice daily.  180 capsule 0  . Multiple Vitamin (MULTIVITAMIN) tablet Take 1 tablet by mouth daily.       No current facility-administered medications for this visit.     Allergies:   Clams [shellfish allergy]; Iodine; and Morphine   Social History:  The patient  reports that he has never smoked. He has never used smokeless tobacco. He reports that he drinks alcohol. He reports that he does not use drugs.   Family History:  The patient's  family history includes Coronary artery disease in his mother; Skin cancer in his maternal uncle.    ROS:  Please see the history of present illness.  Otherwise, review of systems is positive for hearing loss, visual disturbance,  back pain, easy bruising, irregular heart beats.  All other systems are reviewed and negative.    PHYSICAL EXAM: VS:  BP 120/70   Pulse (!) 54   Ht 5\' 5"  (1.651 m)   Wt 80.4 kg (177 lb 3.2 oz)   BMI 29.49 kg/m  , BMI Body mass index is 29.49 kg/m. GEN: Well nourished, well developed, in no acute distress  HEENT: normal  Neck: no JVD, no masses. No carotid bruits Cardiac: RRR without murmur or gallop                Respiratory:  clear to auscultation bilaterally, normal work of breathing GI: soft, nontender, nondistended, + BS MS: no deformity or atrophy  Ext: no pretibial edema, pedal pulses 2+= bilaterally Skin: warm and dry, no rash Neuro:  Strength and sensation are intact Psych: euthymic mood, full affect  EKG:  EKG is ordered today. The ekg ordered today shows sinus bradycardia 54 bpm, age undetermined inferior MI, age-indeterminate anterior infarct  Recent Labs: No results found for requested labs within last 8760 hours.   Lipid Panel     Component Value Date/Time   CHOL 95 06/03/2014 0739   TRIG 38.0 06/03/2014 0739   HDL 38.30 (L) 06/03/2014 0739   CHOLHDL 2 06/03/2014 0739   VLDL 7.6 06/03/2014 0739   LDLCALC 49 06/03/2014 0739      Wt Readings from Last 3 Encounters:  09/27/16 80.4 kg (177 lb 3.2 oz)  08/10/15 88 kg (194 lb)  06/10/14 84.7 kg (186 lb 12.8 oz)     Cardiac Studies Reviewed: Echo 11-02-2013: Study Conclusions  - Left ventricle: The cavity size was normal. Wall thickness was normal. Systolic function was moderately reduced. The estimated ejection fraction was in the range of 35% to 40%. There is akinesis of the apical myocardium. Features are consistent with a pseudonormal left ventricular filling pattern, with concomitant abnormal relaxation and increased filling pressure (grade 2 diastolic dysfunction). - Aortic root: The aortic root was mildly dilated. - Mitral valve: Mild regurgitation. - Left atrium: The atrium was  mildly dilated. Impressions:  - Compared to study of 08/26/13, LV function appears to be similar.  Cardiac MR 01-04-2014: FINDINGS: 1. Left ventricle has normal size and thickness with moderately reduced left ventricular function (LVEF 41%). There is akinesis of the apical anterior and inferior walls, mid antero and inferoseptal walls, dyskinesis of the apical septal wall and true apex and hypokinesis of the mid anterior wall. No obvious thrombus but flow stasis was seen in the LV apex.  The measurements are as follows:  LVEDV:  188 ml  LVESV:  111 ml  SV 77 ml  CO:  5.5 ml/min  Myo mass:  146 g  2. There is late gadolinium enhancement in the  following territories:  Apical anterior wall: 50-75% (poor prognosis after revascularization)  Apical septal wall:  75-100 % (poor prognosis)  Apical inferior wall:  50-75% (poor prognosis)  True apex: 75-100 % (poor prognosis)  Mid anterior:  0-25% (good prognosis)  Mid anteroseptal wall:  75-100% (poor prognosis)  Mid inferoseptal:  50-75% (fair prognosis)  3. Right ventricle has normal size, thickness and systolic function (RVEF [id number]) with no regional wall motion abnormalities.  The measurements are as follows:  RVEDV:  158 ml  RVESV:  80 ml  SV 78 ml  RVCO:  5.6 ml/min  4.  Trace mitral regurgitation, mild tricuspid regurgitation.  5. Mildly dilated left atrium. PA = 29 mm, Aortic root 40 mm, STJ 27 mm, ascending aorta 30 mm  IMPRESSION: 1. There is moderate LV systolic dysfunction (LVEF 41%) with evidence of prior infarct in the mid LAD and distal PDA territory with poor prognosis for recovery.  Regional wall motion abnormalities as described above with a flow stasis in the apical portion of the ventricle. NO obvious thrombus.  2.  Normal RV size and function, no evidence of ARVC.  ASSESSMENT AND PLAN: 1.  CAD, native vessel, with old MI: doing well with no exertional angina.  Fleeting chest pains noted buty improved since he has lost weight again. No indication for further testing at this point. Continue current Rx at this point - on appropriate Rx.   2. Chronic systolic heart failure, NYHA I: Pt on losartan. Bradycardia with beta-blockers. Continue same Rx. No functional limitation at present. BP well-controlled.   3. Hyperlipidemia: treated with a high-intensity statin (atorva 80 mg)  4. Symptomatic PVC's: well-controlled on mexilitine  Current medicines are reviewed with the patient today.  The patient does not have concerns regarding medicines.  Labs/ tests ordered today include:   Orders Placed This Encounter  Procedures  . EKG 12-Lead    Disposition:   FU one year  Signed, Zachary Mocha, MD  09/27/2016 5:04 PM    [facility] [address], [place], [place]  [address] Phone: [phone]; Fax: [phone]

## 2016-09-27 NOTE — Patient Instructions (Signed)
Medication Instructions:  Your physician recommends that you continue on your current medications as directed. Please refer to the Current Medication list given to you today.   Labwork: None Ordered   Testing/Procedures: None Ordered   Follow-Up: Your physician wants you to follow-up in: 1 year with Dr. Cooper.  You will receive a reminder letter in the mail two months in advance. If you don't receive a letter, please call our office to schedule the follow-up appointment.   If you need a refill on your cardiac medications before your next appointment, please call your pharmacy.   Thank you for choosing [facility]! Michelle Swinyer, RN [phone]    

## 2016-10-04 ENCOUNTER — Other Ambulatory Visit: Payer: Self-pay | Admitting: *Deleted

## 2016-10-04 MED ORDER — MEXILETINE HCL 150 MG PO CAPS
150.0000 mg | ORAL_CAPSULE | Freq: Two times a day (BID) | ORAL | 3 refills | Status: DC
Start: 2016-10-04 — End: 2017-09-29

## 2016-10-11 ENCOUNTER — Other Ambulatory Visit: Payer: Self-pay | Admitting: Cardiovascular Disease

## 2016-10-14 ENCOUNTER — Other Ambulatory Visit: Payer: Self-pay | Admitting: *Deleted

## 2016-10-14 MED ORDER — ATORVASTATIN CALCIUM 80 MG PO TABS: 80.0000 mg | ORAL_TABLET | Freq: Every day | ORAL | 3 refills | Status: DC

## 2016-10-17 ENCOUNTER — Other Ambulatory Visit: Payer: Self-pay | Admitting: Cardiovascular Disease

## 2016-11-11 ENCOUNTER — Emergency Department (HOSPITAL_COMMUNITY): Payer: BLUE CROSS/BLUE SHIELD

## 2016-11-11 ENCOUNTER — Inpatient Hospital Stay (HOSPITAL_COMMUNITY)
Admission: EM | Admit: 2016-11-11 | Discharge: 2016-11-13 | DRG: 251 | Disposition: A | Discharge status: Home / Self Care | Payer: BLUE CROSS/BLUE SHIELD | Attending: Internal Medicine | Admitting: Internal Medicine

## 2016-11-11 ENCOUNTER — Encounter (HOSPITAL_COMMUNITY): Payer: Self-pay | Admitting: Emergency Medicine

## 2016-11-11 DIAGNOSIS — E785 Hyperlipidemia, unspecified: Secondary | ICD-10-CM | POA: Diagnosis present

## 2016-11-11 DIAGNOSIS — I493 Ventricular premature depolarization: Secondary | ICD-10-CM

## 2016-11-11 DIAGNOSIS — T82855A Stenosis of coronary artery stent, initial encounter: Secondary | ICD-10-CM | POA: Diagnosis not present

## 2016-11-11 DIAGNOSIS — I25110 Atherosclerotic heart disease of native coronary artery with unstable angina pectoris: Secondary | ICD-10-CM | POA: Diagnosis not present

## 2016-11-11 DIAGNOSIS — I2510 Atherosclerotic heart disease of native coronary artery without angina pectoris: Secondary | ICD-10-CM

## 2016-11-11 DIAGNOSIS — I5022 Chronic systolic (congestive) heart failure: Secondary | ICD-10-CM

## 2016-11-11 DIAGNOSIS — Z91013 Allergy to seafood: Secondary | ICD-10-CM

## 2016-11-11 DIAGNOSIS — I252 Old myocardial infarction: Secondary | ICD-10-CM

## 2016-11-11 DIAGNOSIS — Z885 Allergy status to narcotic agent status: Secondary | ICD-10-CM

## 2016-11-11 DIAGNOSIS — I4729 Other ventricular tachycardia: Secondary | ICD-10-CM

## 2016-11-11 DIAGNOSIS — I200 Unstable angina: Secondary | ICD-10-CM

## 2016-11-11 DIAGNOSIS — Z888 Allergy status to other drugs, medicaments and biological substances status: Secondary | ICD-10-CM

## 2016-11-11 DIAGNOSIS — R000 Tachycardia, unspecified: Secondary | ICD-10-CM

## 2016-11-11 DIAGNOSIS — Z8249 Family history of ischemic heart disease and other diseases of the circulatory system: Secondary | ICD-10-CM

## 2016-11-11 DIAGNOSIS — I110 Hypertensive heart disease with heart failure: Secondary | ICD-10-CM | POA: Diagnosis present

## 2016-11-11 DIAGNOSIS — I43 Cardiomyopathy in diseases classified elsewhere: Secondary | ICD-10-CM | POA: Diagnosis present

## 2016-11-11 DIAGNOSIS — Z79899 Other long term (current) drug therapy: Secondary | ICD-10-CM

## 2016-11-11 DIAGNOSIS — Z7902 Long term (current) use of antithrombotics/antiplatelets: Secondary | ICD-10-CM

## 2016-11-11 DIAGNOSIS — I472 Ventricular tachycardia: Secondary | ICD-10-CM

## 2016-11-11 DIAGNOSIS — I10 Essential (primary) hypertension: Secondary | ICD-10-CM | POA: Diagnosis present

## 2016-11-11 DIAGNOSIS — Z9861 Coronary angioplasty status: Secondary | ICD-10-CM

## 2016-11-11 DIAGNOSIS — Y838 Other surgical procedures as the cause of abnormal reaction of the patient, or of later complication, without mention of misadventure at the time of the procedure: Secondary | ICD-10-CM | POA: Diagnosis present

## 2016-11-11 DIAGNOSIS — Z7982 Long term (current) use of aspirin: Secondary | ICD-10-CM

## 2016-11-11 DIAGNOSIS — E876 Hypokalemia: Secondary | ICD-10-CM | POA: Diagnosis present

## 2016-11-11 DIAGNOSIS — R0789 Other chest pain: Secondary | ICD-10-CM | POA: Diagnosis not present

## 2016-11-11 DIAGNOSIS — I255 Ischemic cardiomyopathy: Secondary | ICD-10-CM

## 2016-11-11 HISTORY — DX: Other ventricular tachycardia: I47.29

## 2016-11-11 HISTORY — DX: Ventricular premature depolarization: I49.3

## 2016-11-11 HISTORY — DX: Chronic systolic (congestive) heart failure: I50.22

## 2016-11-11 HISTORY — DX: Bradycardia, unspecified: R00.1

## 2016-11-11 HISTORY — DX: Hypokalemia: E87.6

## 2016-11-11 HISTORY — DX: Ventricular tachycardia: I47.2

## 2016-11-11 LAB — BASIC METABOLIC PANEL
Anion gap: 6 (ref 5–15)
BUN: 16 mg/dL (ref 6–20)
CALCIUM: 8.8 mg/dL — AB (ref 8.9–10.3)
CO2: 23 mmol/L (ref 22–32)
Chloride: 109 mmol/L (ref 101–111)
Creatinine, Ser: 0.87 mg/dL (ref 0.61–1.24)
GFR calc Af Amer: >60 mL/min (ref >60)
GLUCOSE: 97 mg/dL (ref 65–99)
Potassium: 3.6 mmol/L (ref 3.5–5.1)
Sodium: 138 mmol/L (ref 135–145)

## 2016-11-11 LAB — CBC
HEMATOCRIT: 44.6 % (ref 39.0–52.0)
Hemoglobin: 16.3 g/dL (ref 13.0–17.0)
MCH: 32.7 pg (ref 26.0–34.0)
MCHC: 36.5 g/dL — AB (ref 30.0–36.0)
MCV: 89.4 fL (ref 78.0–100.0)
Platelets: 174 10*3/uL (ref 150–400)
RBC: 4.99 MIL/uL (ref 4.22–5.81)
RDW: 12.3 % (ref 11.5–15.5)
WBC: 7.4 10*3/uL (ref 4.0–10.5)

## 2016-11-11 LAB — TROPONIN I

## 2016-11-11 LAB — I-STAT TROPONIN, ED
Troponin i, poc: 0 ng/mL (ref 0.00–0.08)
Troponin i, poc: 0 ng/mL (ref 0.00–0.08)

## 2016-11-11 MED ORDER — SODIUM CHLORIDE 0.9 % IV SOLN: 250.0000 mL | INTRAVENOUS | Status: DC | PRN

## 2016-11-11 MED ORDER — SODIUM CHLORIDE 0.9% FLUSH
3.0000 mL | Freq: Two times a day (BID) | INTRAVENOUS | Status: DC
  Administered 2016-11-11 – 2016-11-12 (×2): 3 mL via INTRAVENOUS

## 2016-11-11 MED ORDER — NITROGLYCERIN 0.4 MG SL SUBL: 0.4000 mg | SUBLINGUAL_TABLET | SUBLINGUAL | Status: DC | PRN

## 2016-11-11 MED ORDER — ASPIRIN 81 MG PO CHEW
243.0000 mg | CHEWABLE_TABLET | Freq: Once | ORAL | Status: AC
  Administered 2016-11-11: 243 mg via ORAL
  Filled 2016-11-11: qty 3

## 2016-11-11 MED ORDER — HEPARIN BOLUS VIA INFUSION
4000.0000 [IU] | Freq: Once | INTRAVENOUS | Status: AC
  Administered 2016-11-11: 4000 [IU] via INTRAVENOUS
  Filled 2016-11-11: qty 4000

## 2016-11-11 MED ORDER — ATORVASTATIN CALCIUM 80 MG PO TABS
80.0000 mg | ORAL_TABLET | Freq: Every day | ORAL | Status: DC
  Administered 2016-11-12: 17:00:00 80 mg via ORAL
  Filled 2016-11-11: qty 1

## 2016-11-11 MED ORDER — CLOPIDOGREL BISULFATE 75 MG PO TABS
75.0000 mg | ORAL_TABLET | Freq: Every day | ORAL | Status: DC
  Administered 2016-11-12 – 2016-11-13 (×2): 75 mg via ORAL
  Filled 2016-11-11 (×3): qty 1

## 2016-11-11 MED ORDER — LOSARTAN POTASSIUM 50 MG PO TABS
50.0000 mg | ORAL_TABLET | Freq: Every day | ORAL | Status: DC
  Administered 2016-11-12 – 2016-11-13 (×2): 50 mg via ORAL
  Filled 2016-11-11 (×3): qty 1

## 2016-11-11 MED ORDER — HEPARIN (PORCINE) IN NACL 100-0.45 UNIT/ML-% IJ SOLN
900.0000 [IU]/h | INTRAMUSCULAR | Status: DC
  Administered 2016-11-11: 900 [IU]/h via INTRAVENOUS
  Filled 2016-11-11: qty 250

## 2016-11-11 MED ORDER — ASPIRIN EC 81 MG PO TBEC
81.0000 mg | DELAYED_RELEASE_TABLET | Freq: Every day | ORAL | Status: DC
  Administered 2016-11-13: 81 mg via ORAL
  Filled 2016-11-11 (×2): qty 1

## 2016-11-11 MED ORDER — SODIUM CHLORIDE 0.9 % WEIGHT BASED INFUSION: 1.0000 mL/kg/h | INTRAVENOUS | Status: DC

## 2016-11-11 MED ORDER — ASPIRIN 81 MG PO CHEW
81.0000 mg | CHEWABLE_TABLET | ORAL | Status: AC
  Administered 2016-11-12: 81 mg via ORAL
  Filled 2016-11-11: qty 1

## 2016-11-11 MED ORDER — ACETAMINOPHEN 325 MG PO TABS
650.0000 mg | ORAL_TABLET | ORAL | Status: DC | PRN
  Administered 2016-11-12: 650 mg via ORAL
  Filled 2016-11-11: qty 2

## 2016-11-11 MED ORDER — SODIUM CHLORIDE 0.9% FLUSH: 3.0000 mL | INTRAVENOUS | Status: DC | PRN

## 2016-11-11 MED ORDER — MEXILETINE HCL 150 MG PO CAPS
150.0000 mg | ORAL_CAPSULE | Freq: Two times a day (BID) | ORAL | Status: DC
  Administered 2016-11-11 – 2016-11-13 (×4): 150 mg via ORAL
  Filled 2016-11-11 (×4): qty 1

## 2016-11-11 MED ORDER — ONDANSETRON HCL 4 MG/2ML IJ SOLN: 4.0000 mg | Freq: Four times a day (QID) | INTRAMUSCULAR | Status: DC | PRN

## 2016-11-11 MED ORDER — SODIUM CHLORIDE 0.9 % WEIGHT BASED INFUSION
3.0000 mL/kg/h | INTRAVENOUS | Status: DC
  Administered 2016-11-12: 3 mL/kg/h via INTRAVENOUS

## 2016-11-11 NOTE — ED Notes (Signed)
Patient transported to X-ray 

## 2016-11-11 NOTE — ED Triage Notes (Signed)
Pt states "i woke up this morning with chest pain." Pt c/o dull center chest pain, pt states hes had hx of MI. Pt in NAD. Requseting lab work when [employer] goes back to room.

## 2016-11-11 NOTE — ED Notes (Signed)
Called floor for report. Nurse currently assisting another patient.

## 2016-11-11 NOTE — ED Notes (Signed)
Heart healthy tray ordered 

## 2016-11-11 NOTE — Plan of Care (Signed)
Problem: Pain Managment: Goal: General experience of comfort will improve Outcome: Progressing Denies pain and discomfort  Problem: Tissue Perfusion: Goal: Risk factors for ineffective tissue perfusion will decrease Outcome: Progressing On Heparin drip   Problem: Activity: Goal: Risk for activity intolerance will decrease Outcome: Progressing Tolerates activities well  Problem: Bowel/Gastric: Goal: Will not experience complications related to bowel motility Outcome: Progressing Last BM 11/11/2016, no gastric issues reported

## 2016-11-11 NOTE — ED Notes (Signed)
States took one 81mg  aspirin today prior to arrival.

## 2016-11-11 NOTE — ED Notes (Signed)
Cardiology provider at bedside

## 2016-11-11 NOTE — ED Provider Notes (Signed)
[place] DEPT Provider Note   CSN: [id number] Arrival date & time: 11/11/16  1149     History   Chief Complaint Chief Complaint  Patient presents with  . Chest Pain    HPI Zachary Stone is a 60 y.o. male.  HPI Patient presents with left-sided chest pain that started this morning while in bed. States the pain is dull and episodic. Last only a second or 2 and then dissipates completely. Denies any chest pain currently. The pain does not radiate. No associated shortness of breath. No nausea. Patient does admit to mild diaphoresis. Pain was worsened with exertion. States this pain is similar to previous heart attack. No new lower extremity swelling or pain. States he's been compliant with medications. Took 81 mg aspirin prior to arrival. Past Medical History:  Diagnosis Date  . Bradycardia    a. h/o significant bradycardia on beta blocker thus not on one.  . Chronic systolic CHF (congestive heart failure) ([place])   . Coronary artery disease    a. Ant MI 2003 tx with PCI to LAD. b. s/p PCI to RCA as well the LAD in January 2006. c. Atypical CP (anginal equivalent) 10/2016 s/p PTCA of LAD.  Marland Kitchen Diverticulitis    on CT 2012  . Diverticulosis    on CT scan 2009  . 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  Marland Kitchen Hx of cardiovascular stress test    ETT-Myoview (9/14):  Low risk, EF 44%, dAnt and apical scar, no ischemia  . Hyperlipidemia   . Hypertension   . Hypokalemia - borderline 11/13/2016  . NSVT (nonsustained ventricular tachycardia) ([place])    a. brief run 10/2016 (7bt) also on prob list from prior.    Patient Active Problem List   Diagnosis Date Noted  . Frequent PVCs 11/13/2016  . Hypokalemia - borderline 11/13/2016  . Stenosis of coronary stent   . Chronic systolic heart failure ([place]) 11/11/2016  . Ischemic cardiomyopathy 11/11/2016  . Unstable angina ([place])   . Coronary artery disease involving native coronary artery of native heart with unstable angina pectoris ([place])   . Viral  URI with cough 01/14/2014  . 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 Sinus bradycardia 10/05/2013  . Ventricular tachycardia, non-sustained//PVC 08/30/2013  . Dizziness 08/30/2013  . Hearing loss of both ears 03/26/2013  . Tinnitus of both ears 12/02/2011  . Diverticulitis 05/09/2011  . Eustachian tube dysfunction 05/09/2011  . PLANTAR FASCIITIS, LEFT 10/23/2010  . HYPERPLASIA PROSTATE UNS W/O UR OBST & OTH LUTS 09/28/2010  . Benign essential tremor 09/28/2010  . NEVUS 07/06/2009  . SEBORRHEIC KERATOSIS 07/06/2009  . Hyperlipidemia, unspecified 08/29/2008  . Essential hypertension 08/29/2008  . MYOCARDIAL INFARCTION, HX OF 03/30/2007  . CORONARY ARTERY DISEASE 03/30/2007  . EPICONDYLITIS, HX OF 03/30/2007    Past Surgical History:  Procedure Laterality Date  . CARDIAC CATHETERIZATION N/A 11/12/2016   Procedure: Left Heart Cath and Coronary Angiography;  Surgeon: Leonie Man, MD;  Location: [facility] CV LAB;  Service: Cardiovascular;  Laterality: N/A;  . CARDIAC CATHETERIZATION N/A 11/12/2016   Procedure: Intravascular Pressure Wire/FFR Study;  Surgeon: Leonie Man, MD;  Location: [place] CV LAB;  Service: Cardiovascular;  Laterality: N/A;  . CARDIAC CATHETERIZATION N/A 11/12/2016   Procedure: Coronary Balloon Angioplasty;  Surgeon: Leonie Man, MD;  Location: [place] CV LAB;  Service: Cardiovascular;  Laterality: N/A;  . CHOLECYSTECTOMY    . COLONOSCOPY  2007   Negative recheck 2017  .  CORONARY ANGIOPLASTY WITH STENT PLACEMENT     Dr.Stuckey  . LEFT HEART CATHETERIZATION WITH CORONARY ANGIOGRAM N/A 09/09/2013   Procedure: LEFT HEART CATHETERIZATION WITH CORONARY ANGIOGRAM;  Surgeon: Blane Ohara, MD;  Location: [facility] CATH LAB;  Service: Cardiovascular;  Laterality: N/A;  . TONSILLECTOMY    . VASECTOMY         Home Medications    Prior to Admission medications   Medication Sig Start Date End Date Taking? Authorizing Provider    aspirin 81 MG tablet Take 81 mg by mouth daily.     Yes Historical Provider, MD  atorvastatin (LIPITOR) 80 MG tablet Take 1 tablet (80 mg total) by mouth daily. 10/14/16  Yes Sherren Mocha, MD  clopidogrel (PLAVIX) 75 MG tablet Take 1 tablet (75 mg total) by mouth daily. 01/19/16  Yes Sherren Mocha, MD  losartan (COZAAR) 50 MG tablet Take 1 tablet (50 mg total) by mouth daily. 10/18/16  Yes Sherren Mocha, MD  mexiletine (MEXITIL) 150 MG capsule Take 1 capsule (150 mg total) by mouth 2 (two) times daily. 10/04/16  Yes Sherren Mocha, MD  nitroGLYCERIN (NITROSTAT) 0.4 MG SL tablet Place 1 tablet (0.4 mg total) under the tongue every 5 (five) minutes as needed for chest pain (up to 3 doses). 11/13/16   Dayna N Dunn, PA-C  potassium chloride SA (K-DUR,KLOR-CON) 20 MEQ tablet Take 1 tablet (20 mEq total) by mouth daily. 11/14/16   Charlie Pitter, PA-C    Family History Family History  Problem Relation Age of Onset  . Coronary artery disease Mother   . Skin cancer Maternal Uncle   . Diabetes      MGGM    Social History Social History  Substance Use Topics  . Smoking status: Never Smoker  . Smokeless tobacco: Never Used  . Alcohol use Yes     Comment: 2-3 glasses of wine / month     Allergies   Clams [shellfish allergy]; Morphine; and Iodine   Review of Systems Review of Systems  Constitutional: Positive for diaphoresis. Negative for chills and fever.  Respiratory: Negative for cough and shortness of breath.   Cardiovascular: Positive for chest pain. Negative for palpitations and leg swelling.  Gastrointestinal: Negative for abdominal pain, constipation, diarrhea, nausea and vomiting.  Genitourinary: Negative for dysuria, flank pain and frequency.  Musculoskeletal: Negative for back pain, myalgias, neck pain and neck stiffness.  Skin: Negative for rash and wound.  Neurological: Negative for dizziness, weakness, light-headedness, numbness and headaches.  All other systems reviewed  and are negative.    Physical Exam Updated Vital Signs BP 130/86   Pulse (!) 53   Temp 97.6 F (36.4 C) (Oral)   Resp 13   Ht 5\' 4"  (1.626 m)   Wt 171 lb 15.3 oz (78 kg)   SpO2 96%   BMI 29.52 kg/m   Physical Exam  Constitutional: He is oriented to person, place, and time. He appears well-developed and well-nourished. No distress.  Mildly anxious  HENT:  Head: Normocephalic and atraumatic.  Mouth/Throat: Oropharynx is clear and moist.  Eyes: EOM are normal. Pupils are equal, round, and reactive to light.  Neck: Normal range of motion. Neck supple. No JVD present.  Cardiovascular: Normal rate, regular rhythm, normal heart sounds and intact distal pulses.  Exam reveals no gallop and no friction rub.   No murmur heard. Pulmonary/Chest: Effort normal and breath sounds normal. No respiratory distress. He has no wheezes. He has no rales. He exhibits no tenderness.  Abdominal: Soft. Bowel sounds are normal. There is no tenderness. There is no rebound and no guarding.  Musculoskeletal: Normal range of motion. He exhibits no edema or tenderness.  No lower extremity swelling, asymmetry or tenderness.  Neurological: He is alert and oriented to person, place, and time.  Physical extremities without deficit. Sensation intact.  Skin: Skin is warm and dry. Capillary refill takes less than 2 seconds. No rash noted. No erythema.  Psychiatric: His behavior is normal.  Nursing note and vitals reviewed.    ED Treatments / Results  Labs (all labs ordered are listed, but only abnormal results are displayed) Labs Reviewed  BASIC METABOLIC PANEL - Abnormal; Notable for the following:       Result Value   Calcium 8.8 (*)    All other components within normal limits  CBC - Abnormal; Notable for the following:    MCHC 36.5 (*)    All other components within normal limits  BASIC METABOLIC PANEL - Abnormal; Notable for the following:    Glucose, Bld 100 (*)    Calcium 8.6 (*)    All other  components within normal limits  BASIC METABOLIC PANEL - Abnormal; Notable for the following:    Glucose, Bld 107 (*)    Calcium 8.6 (*)    All other components within normal limits  CBC - Abnormal; Notable for the following:    MCHC 36.3 (*)    All other components within normal limits  TROPONIN I  TROPONIN I  TROPONIN I  HEPARIN LEVEL (UNFRACTIONATED)  PROTIME-INR  MAGNESIUM  I-STAT TROPOININ, ED  I-STAT TROPOININ, ED  POCT ACTIVATED CLOTTING TIME  POCT ACTIVATED CLOTTING TIME    EKG  EKG Interpretation  Date/Time:  Monday November 11 2016 11:52:53 EST Ventricular Rate:  58 PR Interval:  164 QRS Duration: 88 QT Interval:  402 QTC Calculation: 394 R Axis:   -22 Text Interpretation:  Sinus bradycardia Low voltage QRS Inferior infarct , age undetermined Cannot rule out Anteroseptal infarct , age undetermined Abnormal ECG Confirmed by Lita Mains  MD, Emery Dupuy ([id number]) on 11/11/2016 12:12:53 PM       Radiology No results found.  Procedures Procedures (including critical care time)  Medications Ordered in ED Medications  losartan (COZAAR) tablet 50 mg (50 mg Oral Given 11/13/16 0849)  atorvastatin (LIPITOR) tablet 80 mg (80 mg Oral Given 11/12/16 1718)  mexiletine (MEXITIL) capsule 150 mg (150 mg Oral Given 11/13/16 0850)  clopidogrel (PLAVIX) tablet 75 mg (75 mg Oral Given 11/13/16 0849)  aspirin EC tablet 81 mg (81 mg Oral Given 11/13/16 0851)  nitroGLYCERIN (NITROSTAT) SL tablet 0.4 mg ( Sublingual MAR Unhold 11/12/16 1640)  acetaminophen (TYLENOL) tablet 650 mg (650 mg Oral Given 11/12/16 1942)  ondansetron (ZOFRAN) injection 4 mg ( Intravenous MAR Unhold 11/12/16 1640)  sodium chloride flush (NS) 0.9 % injection 3 mL (3 mLs Intravenous Not Given 11/13/16 1016)  sodium chloride flush (NS) 0.9 % injection 3 mL (not administered)  0.9 %  sodium chloride infusion (not administered)  labetalol (NORMODYNE,TRANDATE) injection 10 mg (not administered)  hydrALAZINE  (APRESOLINE) injection 5 mg (not administered)  0.9% sodium chloride infusion (0 mL/kg/hr  79.8 kg Intravenous Stopped 11/13/16 0045)  potassium chloride SA (K-DUR,KLOR-CON) CR tablet 20 mEq (not administered)  aspirin chewable tablet 243 mg (243 mg Oral Given 11/11/16 1241)  aspirin chewable tablet 81 mg (81 mg Oral Given 11/12/16 0602)  heparin bolus via infusion 4,000 Units (4,000 Units Intravenous Bolus from Bag 11/11/16 2109)  angioplasty book ( Does not apply Given 11/12/16 2115)  potassium chloride SA (K-DUR,KLOR-CON) CR tablet 40 mEq (40 mEq Oral Given 11/13/16 1149)  magnesium oxide (MAG-OX) tablet 400 mg (400 mg Oral Given 11/13/16 1149)     Initial Impression / Assessment and Plan / ED Course  I have reviewed the triage vital signs and the nursing notes.  Pertinent labs & imaging results that were available during my care of the patient were reviewed by me and considered in my medical decision making (see chart for details).  Clinical Course    Discussed with Dr. Debara Pickett. Advised obtaining second troponin and will evaluate in the emergency department and disposition.   Final Clinical Impressions(s) / ED Diagnoses   Final diagnoses:  S/P PTCA (percutaneous transluminal coronary angioplasty)    New Prescriptions Discharge Medication List as of 11/13/2016 12:33 PM    START taking these medications   Details  nitroGLYCERIN (NITROSTAT) 0.4 MG SL tablet Place 1 tablet (0.4 mg total) under the tongue every 5 (five) minutes as needed for chest pain (up to 3 doses)., Starting Wed 11/13/2016, Normal    potassium chloride SA (K-DUR,KLOR-CON) 20 MEQ tablet Take 1 tablet (20 mEq total) by mouth daily., Starting Thu 11/14/2016, Normal         Julianne Rice, MD 11/13/16 1536

## 2016-11-11 NOTE — H&P (Signed)
Patient ID: Zachary Stone MRN: [id number], DOB/AGE: August 30, 1956   Admit date: 11/11/2016   Primary Physician: Junie Panning, NP Primary Cardiologist: Dr. Burt Knack  Pt. Profile:  Zachary Stone is a 60 year old Caucasian male with past medical history of chronic systolic heart failure, high PVC burden on mexiletine, hypertension, hyperlipidemia, and coronary artery disease s/p PCI to LAD and RCA presented with chest discomfort lasting seconds at a time increasing in frequency in the last several month. It is also associated with exertion. Despite atypical presentation, this is actually quite similar to his previous angina. I doubt he has been consulted for chest pain.  Problem List  Past Medical History:  Diagnosis Date  . Coronary artery disease    s/p anterior MI in 2003 treated with a BMS to the LAD x2. He subsequently underwent DES placement to the RCA as well the LAD in January 2006.  Echo 9/03: Apical AK, EF 60%.  Last LHC 11/2010:  dLM 20, prox to mid LAD stents with mod ISR (40-50%), oD2 jailed (99% - non flow limiting), mCFX 30, OM2 30, pRCA stent ok with 20% ISR, EF 50-55%.  Myoview 11/2010: ant-apical MI, no isch, EF 51%, low risk;    . Diverticulitis    on CT 2012  . Diverticulosis    on CT scan 2009  . Hx of cardiovascular stress test    ETT-Myoview (9/14):  Low risk, EF 44%, dAnt and apical scar, no ischemia  . Hyperlipidemia   . Hypertension     Past Surgical History:  Procedure Laterality Date  . CHOLECYSTECTOMY    . COLONOSCOPY  2007   Negative recheck 2017  . CORONARY ANGIOPLASTY WITH STENT PLACEMENT     Dr.Stuckey  . LEFT HEART CATHETERIZATION WITH CORONARY ANGIOGRAM N/A 09/09/2013   Procedure: LEFT HEART CATHETERIZATION WITH CORONARY ANGIOGRAM;  Surgeon: Blane Ohara, MD;  Location: [facility] CATH LAB;  Service: Cardiovascular;  Laterality: N/A;  . TONSILLECTOMY    . VASECTOMY       Allergies  Allergies  Allergen Reactions  . Clams [Shellfish Allergy]  Diarrhea and Nausea And Vomiting  . Morphine Other (See Comments)    Heart races  . Iodine Other (See Comments)    Patient has never had CT contrast to know if he is allergic to Iodine.  Only allergic to Clams and Morphine.  JWM 05/06/11:  Had Ct contrast today and did not have a reaction.    HPI  Zachary Stone is a 60 year old Caucasian male with past medical history of chronic systolic heart failure, high PVC burden on mexiletine, hypertension, hyperlipidemia, and coronary artery disease s/p PCI to LAD and RCA. He had a bare metal stent to LAD in 2003 for anterior wall infarction. He later undergone PCI of RCA and repeat PCI of LAD. He is on losartan for LV dysfunction. He is not on beta blocker as he had bradycardia on beta blocker. His last Myoview was in 2014, he later ended up having a relook cardiac catheterization in October 2014 that showed patent stent in RCA and moderate LAD stenosis. He had a cardiac MRI in February 2015 that showed EF 41%. It does not appear he has ICD placed. His most recent visit was on 09/27/2016 at which time he was complaining of some twinges in his chest that last seconds at a time. It was not associated with exertion, therefore no further workup was recommended.  Since then, he says his chest discomfort has worsened. He says this atypical  presentation of chest discomfort lasting seconds at a time was actually quite similar to his previous angina prior to his previous PCI. He says he never had a regular presentation before and always resented with chest discomfort lasting seconds at a time. Interestingly, it initially wasn't associated with exertion until this morning. He woke up having chest discomfort lasting seconds at a time again. Only thing different is it was more frequent this morning having up to 4 episodes within a matter of minutes. He was walking from the parking lot to the emergency room when he started noticing worsening symptom with exertion. Initial  point-of-care troponin was negative 2. Cardiology was consulted for chest pain.    Home Medications  Prior to Admission medications   Medication Sig Start Date End Date Taking? Authorizing Provider  aspirin 81 MG tablet Take 81 mg by mouth daily.     Yes Historical Provider, MD  atorvastatin (LIPITOR) 80 MG tablet Take 1 tablet (80 mg total) by mouth daily. 10/14/16  Yes Sherren Mocha, MD  clopidogrel (PLAVIX) 75 MG tablet Take 1 tablet (75 mg total) by mouth daily. 01/19/16  Yes Sherren Mocha, MD  losartan (COZAAR) 50 MG tablet Take 1 tablet (50 mg total) by mouth daily. 10/18/16  Yes Sherren Mocha, MD  mexiletine (MEXITIL) 150 MG capsule Take 1 capsule (150 mg total) by mouth 2 (two) times daily. 10/04/16  Yes Sherren Mocha, MD    Family History  Family History  Problem Relation Age of Onset  . Coronary artery disease Mother   . Skin cancer Maternal Uncle   . Diabetes      MGGM    Social History  Social History   Social History  . Marital status: Married    Spouse name: N/A  . Number of children: N/A  . Years of education: N/A   Occupational History  . Not on file.   Social History Main Topics  . Smoking status: Never Smoker  . Smokeless tobacco: Never Used  . Alcohol use Yes     Comment: 2-3 glasses of wine / month  . Drug use: No  . Sexual activity: Not on file   Other Topics Concern  . Not on file   Social History Narrative  . No narrative on file     Review of Systems General:  No chills, fever, night sweats or weight changes.  Cardiovascular:  No dyspnea on exertion, edema, orthopnea, palpitations, paroxysmal nocturnal dyspnea. +chest pain Dermatological: No rash, lesions/masses Respiratory: No cough, dyspnea Urologic: No hematuria, dysuria Abdominal:   No nausea, vomiting, diarrhea, bright red blood per rectum, melena, or hematemesis Neurologic:  No visual changes, wkns, changes in mental status. All other systems reviewed and are otherwise  negative except as noted above.  Physical Exam  Blood pressure 139/88, pulse (!) 56, temperature 98 F (36.7 C), temperature source Oral, resp. rate 18, SpO2 100 %.  General: Pleasant, NAD Psych: Normal affect. Neuro: Alert and oriented X 3. Moves all extremities spontaneously. HEENT: Normal  Neck: Supple without bruits or JVD. Lungs:  Resp regular and unlabored, CTA. Heart: RRR no s3, s4, or murmurs. Abdomen: Soft, non-tender, non-distended, BS + x 4.  Extremities: No clubbing, cyanosis or edema. DP/PT/Radials 2+ and equal bilaterally.  Labs  Troponin [facility] of Care Test)  Recent Labs  11/11/16 1219  TROPIPOC 0.00   No results for input(s): CKTOTAL, CKMB, TROPONINI in the last 72 hours. Lab Results  Component Value Date   WBC 7.4 11/11/2016  HGB 16.3 11/11/2016   HCT 44.6 11/11/2016   MCV 89.4 11/11/2016   PLT 174 11/11/2016    Recent Labs Lab 11/11/16 1210  NA 138  K 3.6  CL 109  CO2 23  BUN 16  CREATININE 0.87  CALCIUM 8.8*  GLUCOSE 97   Lab Results  Component Value Date   CHOL 95 06/03/2014   HDL 38.30 (L) 06/03/2014   LDLCALC 49 06/03/2014   TRIG 38.0 06/03/2014   No results found for: DDIMER   Radiology/Studies  Dg Chest 2 View  Result Date: 11/11/2016 CLINICAL DATA:  Chest pain mid to LEFT side, chronic chest pain for years but worse this morning, history hypertension, hyperlipidemia EXAM: CHEST  2 VIEW COMPARISON:  11/20/2010 FINDINGS: Normal heart size, mediastinal contours, and pulmonary vascularity. Coronary arterial stents noted. Chronic peribronchial thickening with minimal subsegmental atelectasis at LEFT costophrenic angle. No acute infiltrate, pleural effusion or pneumothorax. Bones unremarkable. Surgical clips RIGHT upper quadrant question cholecystectomy. IMPRESSION: Prior coronary PTCA. Bronchitic changes with minimal LEFT basilar subsegmental atelectasis. Electronically Signed   By: Lavonia Dana M.D.   On: 11/11/2016 13:44     ECG  Normal sinus rhythm with poor R wave progression in anterior leads.  Echocardiogram 11/02/2013  LV EF: 35% -  40%  ------------------------------------------------------------ Indications:   CAD of arterial graft 414.04.  ------------------------------------------------------------ History:  PMH: Acquired from the patient and from the patient's chart. Ventricular ectopy. Coronary artery disease. Cardiomyopathy. Risk factors: Hypertension. Dyslipidemia.  ------------------------------------------------------------ Study Conclusions  - Left ventricle: The cavity size was normal. Wall thickness was normal. Systolic function was moderately reduced. The estimated ejection fraction was in the range of 35% to 40%. There is akinesis of the apical myocardium. Features are consistent with a pseudonormal left ventricular filling pattern, with concomitant abnormal relaxation and increased filling pressure (grade 2 diastolic dysfunction). - Aortic root: The aortic root was mildly dilated. - Mitral valve: Mild regurgitation. - Left atrium: The atrium was mildly dilated. Impressions:  - Compared to study of 08/26/13, LV function appears to be similar.    ASSESSMENT AND PLAN  1. Chest pain reminiscent of prior angina  - His baseline chest discomfort is very atypical, however according to the patient, this is his usual angina that is similar to the previous episodes prior to his PCI. EKG showed no sign of significant ischemia. Potential differential diagnosis including frequent PVCs versus angina.  - Will discuss with M.D., given the similarity of his current symptom with his previous angina, may be beneficial for relook cardiac catheterization for definitive assessment. Repeat echocardiogram  - History of significant bradycardia on beta blocker  2. Chronic systolic heart failure: Baseline EF 41% based on cardiac MRI in February 2015.   3. CAD s/p  stents to RCA and LAD. Last cardiac catheterization in 2014, patent stents in RCA and moderate disease in LAD.  4. High PVC burden improved on mexiletine as shown on previous Holter monitor  5. Hypertension: Blood pressure stable  6. Hyperlipidemia: On Lipitor 80 mg daily at home.   Hilbert Corrigan, PA-C 11/11/2016, 4:12 PM

## 2016-11-11 NOTE — ED Notes (Signed)
Patient tolerated [place] sandwhich without incident. Retail banker order heart health diet.

## 2016-11-11 NOTE — Progress Notes (Signed)
ANTICOAGULATION CONSULT NOTE - Initial Consult  Pharmacy Consult for Heparin Indication: chest pain/ACS  Assessment: 24 yoM admitted on 12/25 for chest pain. Pharmacy consulted to dose and monitor heparin for ACS rule out. Heart catheterization is planned for 12/26. No PTA anticoagulation, only ASA and Plavix (last doses 12/25). Hemoglobin and platelets wnl, no bleeding noted. Heparin dosing weight 75.9 kg.   Goal of Therapy:  Heparin level 0.3-0.7 units/ml Monitor platelets by anticoagulation protocol: Yes   Plan:  Give heparin 4000 units bolus x 1 Start heparin infusion at 900 units/hr Check anti-Xa level in 6 hours and daily while on heparin Continue to monitor H&H and platelets  Follow-up post-cath heparin plan   Allergies  Allergen Reactions  . Clams [Shellfish Allergy] Diarrhea and Nausea And Vomiting  . Morphine Other (See Comments)    Heart races  . Iodine Other (See Comments)    Patient has never had CT contrast to know if he is allergic to Iodine.  Only allergic to Clams and Morphine.  JWM 05/06/11:  Had Ct contrast today and did not have a reaction.    Patient Measurements: Height: 5\' 4"  (162.6 cm) Weight: 177 lb 4.8 oz (80.4 kg) IBW/kg (Calculated) : 59.2 Heparin Dosing Weight: 75.9 kg  Vital Signs: Temp: 98.4 F (36.9 C) (12/25 1853) Temp Source: Oral (12/25 1853) BP: 123/70 (12/25 1853) Pulse Rate: 61 (12/25 1853)  Labs:  Recent Labs  11/11/16 1210  HGB 16.3  HCT 44.6  PLT 174  CREATININE 0.87    Estimated Creatinine Clearance: 84.3 mL/min (by C-G formula based on SCr of 0.87 mg/dL).   Medical History: Past Medical History:  Diagnosis Date  . Coronary artery disease    s/p anterior MI in 2003 treated with a BMS to the LAD x2. He subsequently underwent DES placement to the RCA as well the LAD in January 2006.  Echo 9/03: Apical AK, EF 60%.  Last LHC 11/2010:  dLM 20, prox to mid LAD stents with mod ISR (40-50%), oD2 jailed (99% - non flow  limiting), mCFX 30, OM2 30, pRCA stent ok with 20% ISR, EF 50-55%.  Myoview 11/2010: ant-apical MI, no isch, EF 51%, low risk;    . Diverticulitis    on CT 2012  . Diverticulosis    on CT scan 2009  . Hx of cardiovascular stress test    ETT-Myoview (9/14):  Low risk, EF 44%, dAnt and apical scar, no ischemia  . Hyperlipidemia   . Hypertension     Medications:  Scheduled:  . heparin  4,000 Units Intravenous Once   Infusions:  . heparin      Belia Heman, PharmD PGY1 Pharmacy Resident [phone] (Pager) 11/11/2016 8:16 PM

## 2016-11-11 NOTE — ED Notes (Signed)
Cardiologist at bedside. Ordered patient able to eat. Gave patient [place] sandwich meal.

## 2016-11-12 ENCOUNTER — Encounter (HOSPITAL_COMMUNITY)
Admission: EM | Disposition: A | Discharge status: Home / Self Care | Payer: Self-pay | Source: Home / Self Care | Attending: Internal Medicine

## 2016-11-12 DIAGNOSIS — Z79899 Other long term (current) drug therapy: Secondary | ICD-10-CM | POA: Diagnosis not present

## 2016-11-12 DIAGNOSIS — E876 Hypokalemia: Secondary | ICD-10-CM | POA: Diagnosis present

## 2016-11-12 DIAGNOSIS — Z885 Allergy status to narcotic agent status: Secondary | ICD-10-CM | POA: Diagnosis not present

## 2016-11-12 DIAGNOSIS — R072 Precordial pain: Secondary | ICD-10-CM

## 2016-11-12 DIAGNOSIS — T82855A Stenosis of coronary artery stent, initial encounter: Secondary | ICD-10-CM

## 2016-11-12 DIAGNOSIS — Z91013 Allergy to seafood: Secondary | ICD-10-CM | POA: Diagnosis not present

## 2016-11-12 DIAGNOSIS — I5022 Chronic systolic (congestive) heart failure: Secondary | ICD-10-CM | POA: Diagnosis present

## 2016-11-12 DIAGNOSIS — E785 Hyperlipidemia, unspecified: Secondary | ICD-10-CM | POA: Diagnosis present

## 2016-11-12 DIAGNOSIS — I252 Old myocardial infarction: Secondary | ICD-10-CM | POA: Diagnosis not present

## 2016-11-12 DIAGNOSIS — R0789 Other chest pain: Secondary | ICD-10-CM | POA: Diagnosis present

## 2016-11-12 DIAGNOSIS — Z7902 Long term (current) use of antithrombotics/antiplatelets: Secondary | ICD-10-CM | POA: Diagnosis not present

## 2016-11-12 DIAGNOSIS — I25110 Atherosclerotic heart disease of native coronary artery with unstable angina pectoris: Secondary | ICD-10-CM | POA: Diagnosis present

## 2016-11-12 DIAGNOSIS — Z7982 Long term (current) use of aspirin: Secondary | ICD-10-CM | POA: Diagnosis not present

## 2016-11-12 DIAGNOSIS — Y838 Other surgical procedures as the cause of abnormal reaction of the patient, or of later complication, without mention of misadventure at the time of the procedure: Secondary | ICD-10-CM | POA: Diagnosis present

## 2016-11-12 DIAGNOSIS — I110 Hypertensive heart disease with heart failure: Secondary | ICD-10-CM | POA: Diagnosis present

## 2016-11-12 DIAGNOSIS — Z888 Allergy status to other drugs, medicaments and biological substances status: Secondary | ICD-10-CM | POA: Diagnosis not present

## 2016-11-12 DIAGNOSIS — I255 Ischemic cardiomyopathy: Secondary | ICD-10-CM | POA: Diagnosis present

## 2016-11-12 DIAGNOSIS — Z8249 Family history of ischemic heart disease and other diseases of the circulatory system: Secondary | ICD-10-CM | POA: Diagnosis not present

## 2016-11-12 HISTORY — PX: CARDIAC CATHETERIZATION: SHX172

## 2016-11-12 LAB — TROPONIN I
Troponin I: <0.03 ng/mL (ref <0.03)
Troponin I: <0.03 ng/mL (ref <0.03)

## 2016-11-12 LAB — BASIC METABOLIC PANEL
Anion gap: 7 (ref 5–15)
BUN: 9 mg/dL (ref 6–20)
CHLORIDE: 109 mmol/L (ref 101–111)
CO2: 24 mmol/L (ref 22–32)
CREATININE: 0.88 mg/dL (ref 0.61–1.24)
Calcium: 8.6 mg/dL — ABNORMAL LOW (ref 8.9–10.3)
GFR calc Af Amer: >60 mL/min (ref >60)
GFR calc non Af Amer: >60 mL/min (ref >60)
GLUCOSE: 100 mg/dL — AB (ref 65–99)
Potassium: 4 mmol/L (ref 3.5–5.1)
SODIUM: 140 mmol/L (ref 135–145)

## 2016-11-12 LAB — POCT ACTIVATED CLOTTING TIME
Activated Clotting Time: 290 seconds
Activated Clotting Time: 362 seconds

## 2016-11-12 LAB — HEPARIN LEVEL (UNFRACTIONATED): HEPARIN UNFRACTIONATED: 0.47 [IU]/mL (ref 0.30–0.70)

## 2016-11-12 LAB — PROTIME-INR
INR: 0.96
Prothrombin Time: 12.8 seconds (ref 11.4–15.2)

## 2016-11-12 SURGERY — LEFT HEART CATH AND CORONARY ANGIOGRAPHY
Anesthesia: LOCAL

## 2016-11-12 MED ORDER — HEPARIN (PORCINE) IN NACL 2-0.9 UNIT/ML-% IJ SOLN
INTRAMUSCULAR | Status: AC
  Filled 2016-11-12: qty 1000

## 2016-11-12 MED ORDER — VERAPAMIL HCL 2.5 MG/ML IV SOLN
INTRAVENOUS | Status: AC
  Filled 2016-11-12: qty 2

## 2016-11-12 MED ORDER — LABETALOL HCL 5 MG/ML IV SOLN: 10.0000 mg | INTRAVENOUS | Status: AC | PRN

## 2016-11-12 MED ORDER — FENTANYL CITRATE (PF) 100 MCG/2ML IJ SOLN
INTRAMUSCULAR | Status: DC | PRN
  Administered 2016-11-12: 25 ug via INTRAVENOUS
  Administered 2016-11-12: 50 ug via INTRAVENOUS

## 2016-11-12 MED ORDER — HEPARIN SODIUM (PORCINE) 1000 UNIT/ML IJ SOLN
INTRAMUSCULAR | Status: DC | PRN
  Administered 2016-11-12 (×2): 4000 [IU] via INTRAVENOUS

## 2016-11-12 MED ORDER — HEPARIN SODIUM (PORCINE) 1000 UNIT/ML IJ SOLN
INTRAMUSCULAR | Status: AC
  Filled 2016-11-12: qty 1

## 2016-11-12 MED ORDER — VERAPAMIL HCL 2.5 MG/ML IV SOLN
INTRAVENOUS | Status: DC | PRN
  Administered 2016-11-12: 10 mL via INTRA_ARTERIAL

## 2016-11-12 MED ORDER — IOPAMIDOL (ISOVUE-370) INJECTION 76%
INTRAVENOUS | Status: DC | PRN
  Administered 2016-11-12: 135 mL via INTRAVENOUS

## 2016-11-12 MED ORDER — HEPARIN (PORCINE) IN NACL 2-0.9 UNIT/ML-% IJ SOLN
INTRAMUSCULAR | Status: DC | PRN
  Administered 2016-11-12: 1000 mL

## 2016-11-12 MED ORDER — NITROGLYCERIN 1 MG/10 ML FOR IR/CATH LAB
INTRA_ARTERIAL | Status: AC
  Filled 2016-11-12: qty 10

## 2016-11-12 MED ORDER — LIDOCAINE HCL (PF) 1 % IJ SOLN
INTRAMUSCULAR | Status: DC | PRN
  Administered 2016-11-12: 5 mL via SUBCUTANEOUS

## 2016-11-12 MED ORDER — MIDAZOLAM HCL 2 MG/2ML IJ SOLN
INTRAMUSCULAR | Status: DC | PRN
  Administered 2016-11-12: 1 mg via INTRAVENOUS
  Administered 2016-11-12: 2 mg via INTRAVENOUS

## 2016-11-12 MED ORDER — MIDAZOLAM HCL 2 MG/2ML IJ SOLN
INTRAMUSCULAR | Status: AC
  Filled 2016-11-12: qty 2

## 2016-11-12 MED ORDER — NITROGLYCERIN 1 MG/10 ML FOR IR/CATH LAB
INTRA_ARTERIAL | Status: DC | PRN
  Administered 2016-11-12 (×2): 200 ug via INTRACORONARY

## 2016-11-12 MED ORDER — FENTANYL CITRATE (PF) 100 MCG/2ML IJ SOLN
INTRAMUSCULAR | Status: AC
  Filled 2016-11-12: qty 2

## 2016-11-12 MED ORDER — SODIUM CHLORIDE 0.9% FLUSH: 3.0000 mL | Freq: Two times a day (BID) | INTRAVENOUS | Status: DC

## 2016-11-12 MED ORDER — HEPARIN (PORCINE) IN NACL 2-0.9 UNIT/ML-% IJ SOLN: INTRAMUSCULAR | Status: DC | PRN

## 2016-11-12 MED ORDER — SODIUM CHLORIDE 0.9 % WEIGHT BASED INFUSION
1.0000 mL/kg/h | INTRAVENOUS | Status: AC
  Administered 2016-11-12: 17:00:00 1 mL/kg/h via INTRAVENOUS

## 2016-11-12 MED ORDER — SODIUM CHLORIDE 0.9 % IV SOLN: 250.0000 mL | INTRAVENOUS | Status: DC | PRN

## 2016-11-12 MED ORDER — ANGIOPLASTY BOOK
Freq: Once | Status: AC
  Administered 2016-11-12: 21:00:00
  Filled 2016-11-12: qty 1

## 2016-11-12 MED ORDER — ADENOSINE 12 MG/4ML IV SOLN
INTRAVENOUS | Status: AC
  Filled 2016-11-12: qty 16

## 2016-11-12 MED ORDER — ADENOSINE (DIAGNOSTIC) 140MCG/KG/MIN
INTRAVENOUS | Status: DC | PRN
  Administered 2016-11-12: 140 ug/kg/min via INTRAVENOUS

## 2016-11-12 MED ORDER — IOPAMIDOL (ISOVUE-370) INJECTION 76%
INTRAVENOUS | Status: AC
  Filled 2016-11-12: qty 50

## 2016-11-12 MED ORDER — LIDOCAINE HCL (PF) 1 % IJ SOLN
INTRAMUSCULAR | Status: AC
  Filled 2016-11-12: qty 30

## 2016-11-12 MED ORDER — HYDRALAZINE HCL 20 MG/ML IJ SOLN: 5.0000 mg | INTRAMUSCULAR | Status: AC | PRN

## 2016-11-12 MED ORDER — IOPAMIDOL (ISOVUE-370) INJECTION 76%
INTRAVENOUS | Status: AC
Start: 2016-11-12 — End: 2016-11-12
  Filled 2016-11-12: qty 100

## 2016-11-12 MED ORDER — SODIUM CHLORIDE 0.9% FLUSH: 3.0000 mL | INTRAVENOUS | Status: DC | PRN

## 2016-11-12 SURGICAL SUPPLY — 19 items
BALLN ANGIOSCULPT RX 2.5X15 (BALLOONS) ×2
BALLN ANGIOSCULPT RX 3.5X10 (BALLOONS) ×2
BALLN ~~LOC~~ EMERGE MR 3.0X20 (BALLOONS) ×2
BALLOON ANGIOSCULPT RX 2.5X15 (BALLOONS) IMPLANT
BALLOON ANGIOSCULPT RX 3.5X10 (BALLOONS) IMPLANT
BALLOON ~~LOC~~ EMERGE MR 3.0X20 (BALLOONS) IMPLANT
CATH OPTITORQUE TIG 4.0 5F (CATHETERS) ×1 IMPLANT
CATH VISTA GUIDE 6FR XBLAD3.5 (CATHETERS) ×1 IMPLANT
DEVICE RAD COMP TR BAND LRG (VASCULAR PRODUCTS) ×1 IMPLANT
GLIDESHEATH SLEND SS 6F .021 (SHEATH) ×1 IMPLANT
GUIDEWIRE INQWIRE 1.5J.035X260 (WIRE) IMPLANT
GUIDEWIRE PRESSURE COMET II (WIRE) ×1 IMPLANT
INQWIRE 1.5J .035X260CM (WIRE) ×2
KIT ENCORE 26 ADVANTAGE (KITS) ×1 IMPLANT
KIT ESSENTIALS PG (KITS) ×1 IMPLANT
KIT HEART LEFT (KITS) ×2 IMPLANT
PACK CARDIAC CATHETERIZATION (CUSTOM PROCEDURE TRAY) ×2 IMPLANT
TRANSDUCER W/STOPCOCK (MISCELLANEOUS) ×2 IMPLANT
TUBING CIL FLEX 10 FLL-RA (TUBING) ×2 IMPLANT

## 2016-11-12 NOTE — Progress Notes (Signed)
Patient Name: Zachary Stone Date of Encounter: 11/12/2016  Primary Cardiologist: Dr. 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 Waiting for cath today.   Inpatient Medications    Scheduled Meds: . [START ON 11/13/2016] aspirin EC  81 mg Oral Daily  . atorvastatin  80 mg Oral q1800  . clopidogrel  75 mg Oral Daily  . losartan  50 mg Oral Daily  . mexiletine  150 mg Oral BID  . sodium chloride flush  3 mL Intravenous Q12H   Continuous Infusions: . sodium chloride    . heparin 900 Units/hr (11/11/16 2108)   PRN Meds: sodium chloride, acetaminophen, nitroGLYCERIN, ondansetron (ZOFRAN) IV, sodium chloride flush   Vital Signs    Vitals:   11/11/16 1815 11/11/16 1853 11/11/16 2100 11/12/16 0500  BP: 123/70 123/70 130/76 130/68  Pulse: 61 61 (!) 54 (!) 51  Resp: 16 16 18 17   Temp:  98.4 F (36.9 C) 97.6 F (36.4 C) 97.8 F (36.6 C)  TempSrc:  Oral    SpO2: 99% 98% 98% 98%  Weight:  177 lb 4.8 oz (80.4 kg)  176 lb (79.8 kg)  Height:  5\' 4"  (1.626 m)      Intake/Output Summary (Last 24 hours) at 11/12/16 1046 Last data filed at 11/12/16 0900  Gross per 24 hour  Intake              201 ml  Output                0 ml  Net              201 ml   Filed Weights   11/11/16 1853 11/12/16 0500  Weight: 177 lb 4.8 oz (80.4 kg) 176 lb (79.8 kg)    Physical Exam    GEN: Well nourished, well developed, in no acute distress.  HEENT: Grossly normal.  Neck: Supple, no JVD, carotid bruits, or masses. Cardiac: RRR, no murmurs, rubs, or gallops. No clubbing, cyanosis, edema.  Radials/DP/PT 2+ and equal bilaterally.  Respiratory:  Respirations regular and unlabored, clear to  auscultation bilaterally. GI: Soft, nontender, nondistended, BS + x 4. MS: no deformity or atrophy. Skin: warm and dry, no rash. Neuro:  Strength and sensation are intact. Psych: AAOx3.  Normal affect.  Labs    CBC  Recent Labs  11/11/16 1210  WBC 7.4  HGB 16.3  HCT 44.6  MCV 89.4  PLT [id number]   Basic Metabolic Panel  Recent Labs  11/11/16 1210 11/12/16 0713  NA 138 140  K 3.6 4.0  CL 109 109  CO2 23 24  GLUCOSE 97 100*  BUN 16 9  CREATININE 0.87 0.88  CALCIUM 8.8* 8.6*   Liver Function Tests No results for input(s): AST, ALT, ALKPHOS, BILITOT, PROT, ALBUMIN in the last 72 hours. No results for input(s): LIPASE, AMYLASE in the last 72 hours. Cardiac Enzymes  Recent Labs  11/11/16 2021 11/12/16 0147 11/12/16 0713  TROPONINI <0.03 <0.03 <0.03   BNP Invalid input(s): POCBNP D-Dimer No results for input(s): DDIMER in the last 72 hours. Hemoglobin A1C No results for input(s): HGBA1C in the last 72 hours. Fasting Lipid Panel No results for input(s):  CHOL, HDL, LDLCALC, TRIG, CHOLHDL, LDLDIRECT in the last 72 hours. Thyroid Function Tests No results for input(s): TSH, T4TOTAL, T3FREE, THYROIDAB in the last 72 hours.  Invalid input(s): FREET3  Telemetry    Sinus bradycardia, rate in 34s - Personally Reviewed  ECG    N/A  Radiology    Dg Chest 2 View  Result Date: 11/11/2016 CLINICAL DATA:  Chest pain mid to LEFT side, chronic chest pain for years but worse this morning, history hypertension, hyperlipidemia EXAM: CHEST  2 VIEW COMPARISON:  11/20/2010 FINDINGS: Normal heart size, mediastinal contours, and pulmonary vascularity. Coronary arterial stents noted. Chronic peribronchial thickening with minimal subsegmental atelectasis at LEFT costophrenic angle. No acute infiltrate, pleural effusion or pneumothorax. Bones unremarkable. Surgical clips RIGHT upper quadrant question cholecystectomy. IMPRESSION: Prior coronary PTCA. Bronchitic changes with minimal  LEFT basilar subsegmental atelectasis. Electronically Signed   By: Lavonia Dana M.D.   On: 11/11/2016 13:44    Cardiac Studies   Pending cath today  Patient Profile     Zachary Stone is a 60 year old Caucasian male with past medical history of chronic systolic heart failure, high PVC burden on mexiletine, hypertension, hyperlipidemia, and coronary artery disease s/p PCI to LAD and RCA presented with chest discomfort lasting seconds at a time increasing in frequency in the last several month. It is also associated with exertion. Despite atypical presentation, this is actually quite similar to his previous angina.   Assessment & Plan    1. Chest pain - Symptoms are atypical however concerning for unstable angina given prior hx of CAD. Pt has ruled out. Troponin negative x 3. Chest pain free since admission. For cath today.  - Continue ASA, plavix, statin and ACE. Not on BB due to bradycardia.  2. HTN - Stable and well controlled.  3. Chronic systolic CHF - He is euvolemic. EF of 35-40% on echo 10/2013.  Signed, Leanor Kail, PA  11/12/2016, 10:46 AM   As above, patient seen and examined. He denies chest pain. He has ruled out. Per Dr. Debara Pickett, proceed with cardiac catheterization today. The risks and benefits were discussed including myocardial infarction, CVA and death and he agrees to proceed. He has an allergy to clams but has not had a dye allergy in the past. Continue present medications. No beta blocker as this has caused bradycardia previously.  Kirk Ruths, MD

## 2016-11-12 NOTE — Progress Notes (Signed)
ANTICOAGULATION CONSULT NOTE  Pharmacy Consult for Heparin Indication: chest pain/ACS  Assessment: 89 yoM admitted on 12/25 for chest pain. Pharmacy consulted to dose heparin for ACS rule out. Heart catheterization is planned for 12/26. No PTA anticoagulation, only ASA and Plavix (last doses 12/25).  -heparin level= 0.47  Goal of Therapy:  Heparin level 0.3-0.7 units/ml Monitor platelets by anticoagulation protocol: Yes   Plan:  -No heparin changes needed -Will follow plans post cath   Allergies  Allergen Reactions  . Clams [Shellfish Allergy] Diarrhea and Nausea And Vomiting  . Morphine Other (See Comments)    Heart races  . Iodine Other (See Comments)    Patient has never had CT contrast to know if he is allergic to Iodine.  Only allergic to Clams and Morphine.  JWM 05/06/11:  Had Ct contrast today and did not have a reaction.    Patient Measurements: Height: 5\' 4"  (162.6 cm) Weight: 176 lb (79.8 kg) IBW/kg (Calculated) : 59.2 Heparin Dosing Weight: 75.9 kg  Vital Signs: Temp: 97.8 F (36.6 C) (12/26 0500) BP: 130/68 (12/26 0500) Pulse Rate: 51 (12/26 0500)  Labs:  Recent Labs  11/11/16 1210 11/11/16 2021 11/12/16 0147 11/12/16 0713  HGB 16.3  --   --   --   HCT 44.6  --   --   --   PLT 174  --   --   --   LABPROT  --   --  12.8  --   INR  --   --  0.96  --   HEPARINUNFRC  --   --  0.47  --   CREATININE 0.87  --   --  0.88  TROPONINI  --  <0.03 <0.03 <0.03    Estimated Creatinine Clearance: 83 mL/min (by C-G formula based on SCr of 0.88 mg/dL).   Medical History: Past Medical History:  Diagnosis Date  . Coronary artery disease    s/p anterior MI in 2003 treated with a BMS to the LAD x2. He subsequently underwent DES placement to the RCA as well the LAD in January 2006.  Echo 9/03: Apical AK, EF 60%.  Last LHC 11/2010:  dLM 20, prox to mid LAD stents with mod ISR (40-50%), oD2 jailed (99% - non flow limiting), mCFX 30, OM2 30, pRCA stent ok with 20% ISR,  EF 50-55%.  Myoview 11/2010: ant-apical MI, no isch, EF 51%, low risk;    . Diverticulitis    on CT 2012  . Diverticulosis    on CT scan 2009  . Hx of cardiovascular stress test    ETT-Myoview (9/14):  Low risk, EF 44%, dAnt and apical scar, no ischemia  . Hyperlipidemia   . Hypertension     Medications:  Scheduled:  . heparin  4,000 Units Intravenous Once   Infusions:  . heparin     Hildred Laser, Pharm D 11/12/2016 9:35 AM

## 2016-11-12 NOTE — H&P (View-Only) (Signed)
Patient Name: Zachary Stone Date of Encounter: 11/12/2016  Primary Cardiologist: Dr. 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 Waiting for cath today.   Inpatient Medications    Scheduled Meds: . [START ON 11/13/2016] aspirin EC  81 mg Oral Daily  . atorvastatin  80 mg Oral q1800  . clopidogrel  75 mg Oral Daily  . losartan  50 mg Oral Daily  . mexiletine  150 mg Oral BID  . sodium chloride flush  3 mL Intravenous Q12H   Continuous Infusions: . sodium chloride    . heparin 900 Units/hr (11/11/16 2108)   PRN Meds: sodium chloride, acetaminophen, nitroGLYCERIN, ondansetron (ZOFRAN) IV, sodium chloride flush   Vital Signs    Vitals:   11/11/16 1815 11/11/16 1853 11/11/16 2100 11/12/16 0500  BP: 123/70 123/70 130/76 130/68  Pulse: 61 61 (!) 54 (!) 51  Resp: 16 16 18 17   Temp:  98.4 F (36.9 C) 97.6 F (36.4 C) 97.8 F (36.6 C)  TempSrc:  Oral    SpO2: 99% 98% 98% 98%  Weight:  177 lb 4.8 oz (80.4 kg)  176 lb (79.8 kg)  Height:  5\' 4"  (1.626 m)      Intake/Output Summary (Last 24 hours) at 11/12/16 1046 Last data filed at 11/12/16 0900  Gross per 24 hour  Intake              201 ml  Output                0 ml  Net              201 ml   Filed Weights   11/11/16 1853 11/12/16 0500  Weight: 177 lb 4.8 oz (80.4 kg) 176 lb (79.8 kg)    Physical Exam    GEN: Well nourished, well developed, in no acute distress.  HEENT: Grossly normal.  Neck: Supple, no JVD, carotid bruits, or masses. Cardiac: RRR, no murmurs, rubs, or gallops. No clubbing, cyanosis, edema.  Radials/DP/PT 2+ and equal bilaterally.  Respiratory:  Respirations regular and unlabored, clear to  auscultation bilaterally. GI: Soft, nontender, nondistended, BS + x 4. MS: no deformity or atrophy. Skin: warm and dry, no rash. Neuro:  Strength and sensation are intact. Psych: AAOx3.  Normal affect.  Labs    CBC  Recent Labs  11/11/16 1210  WBC 7.4  HGB 16.3  HCT 44.6  MCV 89.4  PLT [id number]   Basic Metabolic Panel  Recent Labs  11/11/16 1210 11/12/16 0713  NA 138 140  K 3.6 4.0  CL 109 109  CO2 23 24  GLUCOSE 97 100*  BUN 16 9  CREATININE 0.87 0.88  CALCIUM 8.8* 8.6*   Liver Function Tests No results for input(s): AST, ALT, ALKPHOS, BILITOT, PROT, ALBUMIN in the last 72 hours. No results for input(s): LIPASE, AMYLASE in the last 72 hours. Cardiac Enzymes  Recent Labs  11/11/16 2021 11/12/16 0147 11/12/16 0713  TROPONINI <0.03 <0.03 <0.03   BNP Invalid input(s): POCBNP D-Dimer No results for input(s): DDIMER in the last 72 hours. Hemoglobin A1C No results for input(s): HGBA1C in the last 72 hours. Fasting Lipid Panel No results for input(s):  CHOL, HDL, LDLCALC, TRIG, CHOLHDL, LDLDIRECT in the last 72 hours. Thyroid Function Tests No results for input(s): TSH, T4TOTAL, T3FREE, THYROIDAB in the last 72 hours.  Invalid input(s): FREET3  Telemetry    Sinus bradycardia, rate in 87s - Personally Reviewed  ECG    N/A  Radiology    Dg Chest 2 View  Result Date: 11/11/2016 CLINICAL DATA:  Chest pain mid to LEFT side, chronic chest pain for years but worse this morning, history hypertension, hyperlipidemia EXAM: CHEST  2 VIEW COMPARISON:  11/20/2010 FINDINGS: Normal heart size, mediastinal contours, and pulmonary vascularity. Coronary arterial stents noted. Chronic peribronchial thickening with minimal subsegmental atelectasis at LEFT costophrenic angle. No acute infiltrate, pleural effusion or pneumothorax. Bones unremarkable. Surgical clips RIGHT upper quadrant question cholecystectomy. IMPRESSION: Prior coronary PTCA. Bronchitic changes with minimal  LEFT basilar subsegmental atelectasis. Electronically Signed   By: Lavonia Dana M.D.   On: 11/11/2016 13:44    Cardiac Studies   Pending cath today  Patient Profile     Mr. Zachary Stone is a 60 year old Caucasian male with past medical history of chronic systolic heart failure, high PVC burden on mexiletine, hypertension, hyperlipidemia, and coronary artery disease s/p PCI to LAD and RCA presented with chest discomfort lasting seconds at a time increasing in frequency in the last several month. It is also associated with exertion. Despite atypical presentation, this is actually quite similar to his previous angina.   Assessment & Plan    1. Chest pain - Symptoms are atypical however concerning for unstable angina given prior hx of CAD. Pt has ruled out. Troponin negative x 3. Chest pain free since admission. For cath today.  - Continue ASA, plavix, statin and ACE. Not on BB due to bradycardia.  2. HTN - Stable and well controlled.  3. Chronic systolic CHF - He is euvolemic. EF of 35-40% on echo 10/2013.  Signed, Zachary Kail, PA  11/12/2016, 10:46 AM   As above, patient seen and examined. He denies chest pain. He has ruled out. Per Dr. Debara Pickett, proceed with cardiac catheterization today. The risks and benefits were discussed including myocardial infarction, CVA and death and he agrees to proceed. He has an allergy to clams but has not had a dye allergy in the past. Continue present medications. No beta blocker as this has caused bradycardia previously.  Kirk Ruths, MD

## 2016-11-12 NOTE — Interval H&P Note (Signed)
History and Physical Interval Note:  11/12/2016 3:08 PM  Zachary Stone  has presented today for surgery, with the diagnosis of unstable angina & known CAD-PCI.   The various methods of treatment have been discussed with the patient and family. After consideration of risks, benefits and other options for treatment, the patient has consented to  Procedure(s): Left Heart Cath and Coronary Angiography (N/A) with possible Percutaneous Coronary Intervention as a surgical intervention .  The patient's history has been reviewed, patient examined, no change in status, stable for surgery.  I have reviewed the patient's chart and labs.  Questions were answered to the patient's satisfaction.     Cath Lab Visit (complete for each Cath Lab visit)  Clinical Evaluation Leading to the Procedure:   ACS: Yes.    Non-ACS:    Anginal Classification: CCS III  Anti-ischemic medical therapy: Minimal Therapy (1 class of medications)  Non-Invasive Test Results: No non-invasive testing performed  Prior CABG: No previous CABG    [name]

## 2016-11-13 ENCOUNTER — Encounter (HOSPITAL_COMMUNITY): Payer: Self-pay | Admitting: Cardiology

## 2016-11-13 DIAGNOSIS — I493 Ventricular premature depolarization: Secondary | ICD-10-CM

## 2016-11-13 DIAGNOSIS — E876 Hypokalemia: Secondary | ICD-10-CM

## 2016-11-13 HISTORY — DX: Hypokalemia: E87.6

## 2016-11-13 LAB — BASIC METABOLIC PANEL
ANION GAP: 5 (ref 5–15)
BUN: 11 mg/dL (ref 6–20)
CALCIUM: 8.6 mg/dL — AB (ref 8.9–10.3)
CHLORIDE: 107 mmol/L (ref 101–111)
CO2: 28 mmol/L (ref 22–32)
Creatinine, Ser: 0.89 mg/dL (ref 0.61–1.24)
GFR calc non Af Amer: >60 mL/min (ref >60)
Glucose, Bld: 107 mg/dL — ABNORMAL HIGH (ref 65–99)
POTASSIUM: 3.7 mmol/L (ref 3.5–5.1)
Sodium: 140 mmol/L (ref 135–145)

## 2016-11-13 LAB — CBC
HCT: 41.6 % (ref 39.0–52.0)
HEMOGLOBIN: 15.1 g/dL (ref 13.0–17.0)
MCH: 32.5 pg (ref 26.0–34.0)
MCHC: 36.3 g/dL — ABNORMAL HIGH (ref 30.0–36.0)
MCV: 89.5 fL (ref 78.0–100.0)
Platelets: 165 10*3/uL (ref 150–400)
RBC: 4.65 MIL/uL (ref 4.22–5.81)
RDW: 12.4 % (ref 11.5–15.5)
WBC: 7.8 10*3/uL (ref 4.0–10.5)

## 2016-11-13 LAB — MAGNESIUM: Magnesium: 1.9 mg/dL (ref 1.7–2.4)

## 2016-11-13 MED ORDER — NITROGLYCERIN 0.4 MG SL SUBL: 0.4000 mg | SUBLINGUAL_TABLET | SUBLINGUAL | 3 refills | Status: DC | PRN

## 2016-11-13 MED ORDER — POTASSIUM CHLORIDE CRYS ER 20 MEQ PO TBCR
40.0000 meq | EXTENDED_RELEASE_TABLET | Freq: Once | ORAL | Status: AC
  Administered 2016-11-13: 40 meq via ORAL
  Filled 2016-11-13: qty 2

## 2016-11-13 MED ORDER — POTASSIUM CHLORIDE CRYS ER 20 MEQ PO TBCR: 20.0000 meq | EXTENDED_RELEASE_TABLET | Freq: Every day | ORAL | 3 refills | Status: DC

## 2016-11-13 MED ORDER — POTASSIUM CHLORIDE CRYS ER 20 MEQ PO TBCR: 20.0000 meq | EXTENDED_RELEASE_TABLET | Freq: Every day | ORAL | Status: DC

## 2016-11-13 MED ORDER — MAGNESIUM OXIDE 400 (241.3 MG) MG PO TABS
400.0000 mg | ORAL_TABLET | Freq: Once | ORAL | Status: AC
  Administered 2016-11-13: 400 mg via ORAL
  Filled 2016-11-13: qty 1

## 2016-11-13 NOTE — Discharge Summary (Signed)
Discharge Summary    Patient ID: Zachary Stone,  MRN: [id number], DOB/AGE: 05/16/1956 60 y.o.  Admit date: 11/11/2016 Discharge date: 11/13/2016  Primary Care Provider: Junie Panning Primary Cardiologist: Dr. 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 FFR 0.74  Post intervention - sequential PTCA with Angiosculpt and noncompliant balloons (tapered from 3.5 down to 2.8 mm), there is a 0% residual stenosis.  ______________________________________________  Prox RCA to Mid RCA Stent: 0 %stenosed.  The left ventricular ejection fraction is 35-45% by visual estimate. - Moderately reduced  LV end diastolic pressure is normal.   Severe in-stent restenosis of the entire stented segment of the LAD treated with sequential angioplasty with resolution of 75+ percent stenoses down to 0%.  The size of balloons used would suggest potentially undersized stents.  Moderate cardiomyopathy with akinesis to dyskinesis of the anterior inferior apex with mild aneurysmal dilation.  Plan: Transferred to 6 [facility] post procedure unit for TR band removal. Continue dual antiplatelet therapy Continue aggressive cardiac management.  Expect that he should be able to be discharged by tomorrow.     _____________     History of Present Illness     Zachary Stone is a 60 y.o. male with history of CAD (anterior MI 2003 s/p  PCI to LAD, later PCI of RCA/LAD 123456), chronic systolic heart failure, high PVC burden on mexiletine, hypertension, hyperlipidemia, prior significant bradycardia on beta blocker (thus not on one) who presented to [facility] with chest pain concerning for angina. Last prior cath was in 2014, stable. He had a cardiac MRI in February 2015 that showed EF 41%. He presented with chest discomfort lasting seconds at a time increasing infrequency in the last several month. It was also associated with exertion. Despite atypical presentation, this was actually quite similar to his previous angina. Initial troponins were negative and he was admitted for further evaluation.  Hospital Course    He was placed on heparin per pharmacy. He underwent LHC 11/12/16 showing 75% ISR of prox-distal LAD with FFR 0.74 which was treated with sequential PTCA (per cath note: "Severe in-stent restenosis of the entire stented segment of the LAD treated with sequential angioplasty with resolution of 75+ percent stenoses down to 0%.  The size of balloons used would suggest potentially undersized stents"). This otherwise showed patent RCA stent, LVEDP normal, EF 35-45% with akinesis to dyskinesis of the anterior inferior apex with mild aneurysmal dilation. He tolerated cath well without complication. He did have one 7 beat run NSVT on telemetry, prompting supplementation with potassium (K 3.7) and KCl rx at discharge. He also had Mg of 1.9 and was given MagOx prior to dc - given that this was borderline, would recheck as OP (and consider initiation of scheduled Mag if this remains borderline). Would also consider repeating BMET in follow-up to recheck potassium. He was advised to increase dietary sources of these lytes. Dr. Stanford Breed has seen and examined the patient  today and feels he is stable for discharge. F/u arranged in 1 week. He states he does not need refills of his Plavix. He works from home and declined work note. Per discussion with  schedulers, they have put him on the wait list for Dr. Burt Knack for 3 months as well. He has been referred to cardiac rehab in [facility].  Consultants: N/A _____________  Discharge Vitals Blood pressure 130/86, pulse (!) 53, temperature 97.6 F (36.4 C), temperature source Oral, resp. rate 13, height 5\' 4"  (1.626 m), weight 171 lb 15.3 oz (78 kg), SpO2 96 %.  Filed Weights   11/11/16 1853 11/12/16 0500 11/13/16 0604  Weight: 177 lb 4.8 oz (80.4 kg) 176 lb (79.8 kg) 171 lb 15.3 oz (78 kg)    Labs & Radiologic Studies    CBC  Recent Labs  11/11/16 1210 11/13/16 0244  WBC 7.4 7.8  HGB 16.3 15.1  HCT 44.6 41.6  MCV 89.4 89.5  PLT 174 [id number]   Basic Metabolic Panel  Recent Labs  11/12/16 0713 11/13/16 0244  NA 140 140  K 4.0 3.7  CL 109 107  CO2 24 28  GLUCOSE 100* 107*  BUN 9 11  CREATININE 0.88 0.89  CALCIUM 8.6* 8.6*   Cardiac Enzymes  Recent Labs  11/11/16 2021 11/12/16 0147 11/12/16 0713  TROPONINI <0.03 <0.03 <0.03   _____________  Dg Chest 2 View  Result Date: 11/11/2016 CLINICAL DATA:  Chest pain mid to LEFT side, chronic chest pain for years but worse this morning, history hypertension, hyperlipidemia EXAM: CHEST  2 VIEW COMPARISON:  11/20/2010 FINDINGS: Normal heart size, mediastinal contours, and pulmonary vascularity. Coronary arterial stents noted. Chronic peribronchial thickening with minimal subsegmental atelectasis at LEFT costophrenic angle. No acute infiltrate, pleural effusion or pneumothorax. Bones unremarkable. Surgical clips RIGHT upper quadrant question cholecystectomy. IMPRESSION: Prior coronary PTCA. Bronchitic changes with minimal LEFT basilar subsegmental atelectasis. Electronically Signed   By: Lavonia Dana M.D.   On: 11/11/2016 13:44   Disposition   Pt is being discharged home today in good condition.  Follow-up Plans & Appointments    Follow-up Information    Bhagat,Bhavinkumar, PA Follow up.   Specialty:  Cardiology Why:  11/22/15  at 1:30pm. Robbie Lis is one of the PAs that is part of Dr. Antionette Char care team. The office is also in the process of scheduling a follow-up in 3 months with Dr. Burt Knack. When you come in for your follow-up, please ask status of this. Contact information: [address] [place] [address] [phone]          Discharge Instructions    AMB Referral to Cardiac Rehabilitation - Phase II    Complete by:  As directed    Diagnosis:  Coronary Stents   Amb Referral to Cardiac Rehabilitation    Complete by:  As directed    Diagnosis:  PTCA Comment - To [place]   Diet - low sodium heart healthy    Complete by:  As directed    Increase activity slowly    Complete by:  As directed    No driving for 2 days. No lifting over 5 lbs for 1 week. No sexual activity for 1 week. Keep procedure site clean & dry. If you notice increased pain, swelling, bleeding or pus, call/return! You may shower, but no soaking baths/hot tubs/pools for 1 week.  Please increase dietary intake of healthy sources of magnesium and potassium including leafy greens, nuts, seeds, beans, whole grains,  avocados, yogurt, squash, sweet potatoes, and bananas.      Discharge Medications   Allergies as of 11/13/2016      Reactions   Clams [shellfish Allergy] Diarrhea, Nausea And Vomiting   Morphine Other (See Comments)   Heart races   Iodine Other (See Comments)   Patient has never had CT contrast to know if he is allergic to Iodine.  Only allergic to Clams and Morphine.  JWM 05/06/11:  Had Ct contrast today and did not have a reaction.      Medication List    TAKE these medications   aspirin 81 MG tablet Take 81 mg by mouth daily.   atorvastatin 80 MG tablet Commonly known as:  LIPITOR Take 1 tablet (80 mg total) by mouth daily.   clopidogrel 75 MG tablet Commonly known as:  PLAVIX Take 1 tablet (75 mg total) by mouth daily.   losartan 50 MG tablet Commonly known as:  COZAAR Take 1 tablet (50 mg  total) by mouth daily.   mexiletine 150 MG capsule Commonly known as:  MEXITIL Take 1 capsule (150 mg total) by mouth 2 (two) times daily.   nitroGLYCERIN 0.4 MG SL tablet Commonly known as:  NITROSTAT Place 1 tablet (0.4 mg total) under the tongue every 5 (five) minutes as needed for chest pain (up to 3 doses).   potassium chloride SA 20 MEQ tablet Commonly known as:  K-DUR,KLOR-CON Take 1 tablet (20 mEq total) by mouth daily. Start taking on:  11/14/2016        Allergies:  Allergies  Allergen Reactions  . Clams [Shellfish Allergy] Diarrhea and Nausea And Vomiting  . Morphine Other (See Comments)    Heart races  . Iodine Other (See Comments)    Patient has never had CT contrast to know if he is allergic to Iodine.  Only allergic to Clams and Morphine.  JWM 05/06/11:  Had Ct contrast today and did not have a reaction.      Outstanding Labs/Studies    Consider BMET/Mg  Duration of Discharge Encounter   Greater than 30 minutes including physician time.  Signed, Charlie Pitter PA-C 11/13/2016, 11:27 AM

## 2016-11-13 NOTE — Progress Notes (Signed)
CARDIAC REHAB PHASE I   PRE:  Rate/Rhythm: 37 SR  BP:  Sitting: 130/86        SaO2: 96 RA  MODE:  Ambulation: 500 ft   POST:  Rate/Rhythm: 77 SR  BP:  Sitting: 125/81         SaO2: 99 RA  Pt ambulated 500 ft on RA, handheld assist, steady gait, tolerated well with no complaints other than "feeling a little tired." Completed PCI education.  Reviewed risk factors, anti-platelet therapy, activity restrictions, ntg, exercise, heart healthy diet, CHF booklet and zone tool, daily weights and phase 2 cardiac rehab. Pt verbalized understanding, receptive to review of education. Pt agrees to phase 2 cardiac rehab referral, will send to [facility] per pt request. Pt to recliner after walk, call bell within reach.  [id number] Lenna Sciara, RN, BSN 11/13/2016 9:41 AM

## 2016-11-13 NOTE — Progress Notes (Signed)
Patient Name: Zachary Stone Date of Encounter: 11/13/2016  Primary Cardiologist: Zachary. 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 aspirin EC  81 mg Oral Daily  . atorvastatin  80 mg Oral q1800  . clopidogrel  75 mg Oral Daily  . losartan  50 mg Oral Daily  . mexiletine  150 mg Oral BID  . sodium chloride flush  3 mL Intravenous Q12H   Continuous Infusions:  PRN Meds: sodium chloride, acetaminophen, nitroGLYCERIN, ondansetron (ZOFRAN) IV, sodium chloride flush   Vital Signs    Vitals:   11/12/16 2000 11/12/16 2100 11/13/16 0604 11/13/16 0741  BP: 114/74 124/77 114/61 134/65  Pulse: (!) 54 (!) 58 (!) 49 (!) 53  Resp: 13 20 (!) 22 16  Temp: 97.5 F (36.4 C)  97.8 F (36.6 C) 97.6 F (36.4 C)  TempSrc: Oral  Oral Oral  SpO2: 97% 96% 99% 96%  Weight:   171 lb 15.3 oz (78 kg)   Height:        Intake/Output Summary (Last 24 hours) at 11/13/16 0755 Last data filed at 11/13/16 0604  Gross per 24 hour  Intake           578.55 ml  Output              825 ml  Net          -246.45 ml   Filed Weights   11/11/16 1853 11/12/16 0500 11/13/16 0604  Weight: 177 lb 4.8 oz (80.4 kg) 176 lb (79.8 kg) 171 lb 15.3 oz (78 kg)    Physical Exam    GEN: Well nourished, well developed, in no acute distress.  HEENT: Grossly normal.  Neck: Supple Cardiac: RRR, radial cath site with no hematoma Respiratory:  CTA GI: Soft, nontender, nondistended. MS: no deformity or atrophy. Skin: warm and dry, no rash. Neuro:  Strength and sensation are intact. Psych: AAOx3.  Normal affect.  Labs    CBC  Recent  Labs  11/11/16 1210 11/13/16 0244  WBC 7.4 7.8  HGB 16.3 15.1  HCT 44.6 41.6  MCV 89.4 89.5  PLT 174 [id number]   Basic Metabolic Panel  Recent Labs  11/12/16 0713 11/13/16 0244  NA 140 140  K 4.0 3.7  CL 109 107  CO2 24 28  GLUCOSE 100* 107*  BUN 9 11  CREATININE 0.88 0.89  CALCIUM 8.6* 8.6*   Cardiac Enzymes  Recent Labs  11/11/16 2021 11/12/16 0147 11/12/16 0713  TROPONINI <0.03 <0.03 <0.03     Telemetry    Sinus bradycardia - Personally Reviewed    Radiology    Dg Chest 2 View  Result Date: 11/11/2016 CLINICAL DATA:  Chest pain mid to LEFT side, chronic chest pain for years but worse this morning, history hypertension, hyperlipidemia EXAM: CHEST  2 VIEW COMPARISON:  11/20/2010 FINDINGS: Normal heart size, mediastinal contours, and pulmonary vascularity. Coronary arterial stents noted. Chronic peribronchial thickening with minimal subsegmental atelectasis at LEFT costophrenic angle. No acute infiltrate, pleural effusion or pneumothorax. Bones unremarkable. Surgical clips RIGHT upper quadrant question cholecystectomy. IMPRESSION: Prior coronary PTCA.  Bronchitic changes with minimal LEFT basilar subsegmental atelectasis. Electronically Signed   By: Zachary Stone M.D.   On: 11/11/2016 13:44    Patient Profile     Mr. Pulling is a 60 year old Caucasian male with past medical history of chronic systolic heart failure, high PVC burden on mexiletine, hypertension, hyperlipidemia, and coronary artery disease s/p PCI to LAD and RCA presented with chest discomfort lasting seconds at a time increasing in frequency in the last several month. It is also associated with exertion. Despite atypical presentation, this is actually quite similar to his previous angina.   Assessment & Plan    1. CAD - s/p PCI of LAD. Continue ASA, plavix, statin.  2. HTN - Continue present meds.  3. Chronic systolic CHF/CM - He is euvolemic. EF of 35-45%. Continue ARB; not on beta blocker due to  bradycardia.  DC today; TOC appt one week and Zachary Stone 3 months > 30 min PA and physician time D2  Signed, Zachary Ruths, Zachary Stone  11/13/2016, 7:55 AM

## 2016-11-13 NOTE — Progress Notes (Signed)
TR BAND REMOVAL  LOCATION:    right radial  DEFLATED PER PROTOCOL:    Yes.    TIME BAND OFF / DRESSING APPLIED:    22:30   SITE UPON ARRIVAL:    Level 0  SITE AFTER BAND REMOVAL:    Level 0  CIRCULATION SENSATION AND MOVEMENT:    Within Normal Limits   Yes.    COMMENTS:   Post TR band instructions given. Pt tolerated well. 

## 2016-11-19 NOTE — Progress Notes (Signed)
Cardiology Office Note    Date:  11/21/2016   ID:  Zachary Stone, DOB [birth year], MRN [id number]  PCP:  Smothers, Andree Elk, NP  Cardiologist:  Dr. Burt Knack  Chief Complaint: Hospital follow up s/p cath  History of Present Illness:   Zachary Stone is a 61 y.o. male with history of CAD (anterior MI 2003 s/p PCI to LAD, later PCI of RCA/LAD 123456), chronic systolic heart failure, high PVC burden on mexiletine, hypertension, hyperlipidemia, prior significant bradycardia on beta blocker (thus not on one) who recently admitted 11/11/16-11/13/16 for unstable angina presents for follow up.   He presented 11/11/16 with chest discomfort lasting seconds at a time increasing infrequency in the last several month. It was also associated with exertion. Despite atypical presentation, this was actually quite similar to his previous angina. He was placed on heparin per pharmacy. He underwent LHC 11/12/16 showing 75% ISR of prox-distal LAD with FFR 0.74 which was treated with sequential PTCA (per cath note: "Severe in-stent restenosis of the entire stented segment of the LAD treated with sequential angioplasty with resolution of 75+ percent stenoses down to 0%. The size of balloons used would suggest potentially undersized stents"). This otherwise showed patent RCA stent, LVEDP normal, EF 35-45% with akinesis to dyskinesis of the anterior inferior apex with mild aneurysmal dilation.  He did have one 7 beat run NSVT on telemetry, prompting supplementation with potassium (K 3.7) and KCl rx at discharge. He also had Mg of 1.9 and was given MagOx prior to dc. Plan to get magnesium and BMET at follow up.   Here today for follow-up. He states that he has a blood work drawn to check his electrolytes yesterday with PCP. Intermittent chest pain with and without exertion that has been getting better and better. Only last for few seconds. He is walking approximately 30 minutes a day without any difficulties. Patient denies any  chest pain, dyspnea, orthopnea, PND, syncope, lower extremity edema or blood in his stool or urine.   Past Medical History:  Diagnosis Date  . Bradycardia    a. h/o significant bradycardia on beta blocker thus not on one.  . Chronic systolic CHF (congestive heart failure) ([place])   . Coronary artery disease    a. Ant MI 2003 tx with PCI to LAD. b. s/p PCI to RCA as well the LAD in January 2006. c. Atypical CP (anginal equivalent) 10/2016 s/p PTCA of LAD.  Marland Kitchen Diverticulitis    on CT 2012  . Diverticulosis    on CT scan 2009  . 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  Marland Kitchen Hx of cardiovascular stress test    ETT-Myoview (9/14):  Low risk, EF 44%, dAnt and apical scar, no ischemia  . Hyperlipidemia   . Hypertension   . Hypokalemia - borderline 11/13/2016  . NSVT (nonsustained ventricular tachycardia) ([place])    a. brief run 10/2016 (7bt) also on prob list from prior.    Past Surgical History:  Procedure Laterality Date  . CARDIAC CATHETERIZATION N/A 11/12/2016   Procedure: Left Heart Cath and Coronary Angiography;  Surgeon: Leonie Man, MD;  Location: [facility];  Service: Cardiovascular;  Laterality: N/A;  . CARDIAC CATHETERIZATION N/A 11/12/2016   Procedure: Intravascular Pressure Wire/FFR Study;  Surgeon: Leonie Man, MD;  Location: [facility];  Service: Cardiovascular;  Laterality: N/A;  . CARDIAC CATHETERIZATION N/A 11/12/2016   Procedure: Coronary Balloon Angioplasty;  Surgeon: Leonie Man, MD;  Location: [facility];  Service: Cardiovascular;  Laterality: N/A;  . CHOLECYSTECTOMY    . COLONOSCOPY  2007   Negative recheck 2017  . CORONARY ANGIOPLASTY WITH STENT PLACEMENT     Dr.Stuckey  . LEFT HEART CATHETERIZATION WITH CORONARY ANGIOGRAM N/A 09/09/2013   Procedure: LEFT HEART CATHETERIZATION WITH CORONARY ANGIOGRAM;  Surgeon: Blane Ohara, MD;  Location[facility];  Service: Cardiovascular;  Laterality: N/A;  . TONSILLECTOMY    . VASECTOMY       Current Medications: Prior to Admission medications   Medication Sig Start Date End Date Taking? Authorizing Provider  aspirin 81 MG tablet Take 81 mg by mouth daily.      Historical Provider, MD  atorvastatin (LIPITOR) 80 MG tablet Take 1 tablet (80 mg total) by mouth daily. 10/14/16   Sherren Mocha, MD  clopidogrel (PLAVIX) 75 MG tablet Take 1 tablet (75 mg total) by mouth daily. 01/19/16   Sherren Mocha, MD  losartan (COZAAR) 50 MG tablet Take 1 tablet (50 mg total) by mouth daily. 10/18/16   Sherren Mocha, MD  mexiletine (MEXITIL) 150 MG capsule Take 1 capsule (150 mg total) by mouth 2 (two) times daily. 10/04/16   Sherren Mocha, MD  nitroGLYCERIN (NITROSTAT) 0.4 MG SL tablet Place 1 tablet (0.4 mg total) under the tongue every 5 (five) minutes as needed for chest pain (up to 3 doses). 11/13/16   Dayna N Dunn, PA-C  potassium chloride SA (K-DUR,KLOR-CON) 20 MEQ tablet Take 1 tablet (20 mEq total) by mouth daily. 11/14/16   Dayna N Dunn, PA-C    Allergies:   Clams [shellfish allergy]; Morphine; and Iodine   Social History   Social History  . Marital status: Married    Spouse name: N/A  . Number of children: N/A  . Years of education: N/A   Social History Main Topics  . Smoking status: Never Smoker  . Smokeless tobacco: Never Used  . Alcohol use Yes     Comment: 2-3 glasses of wine / month  . Drug use: No  . Sexual activity: Not Asked   Other Topics Concern  . None   Social History Narrative  . None     Family History:  The patient's family history includes Coronary artery disease in his mother; Skin cancer in his maternal uncle.   ROS:   Please see the history of present illness.    ROS All other systems reviewed and are negative.   PHYSICAL EXAM:   VS:  BP 106/72 (BP Location: Right Arm)   Pulse (!) 52   Ht 5\' 4"  (1.626 m)   Wt 177 lb 1.9 oz (80.3 kg)   BMI 30.40 kg/m    GEN: Well nourished, well developed, in no acute distress  HEENT: normal  Neck: no  JVD, carotid bruits, or masses Cardiac: RRR; no murmurs, rubs, or gallops,no edema  Respiratory:  clear to auscultation bilaterally, normal work of breathing GI: soft, nontender, nondistended, + BS MS: no deformity or atrophy  Skin: warm and dry, no rash Neuro:  Alert and Oriented x 3, Strength and sensation are intact Psych: euthymic mood, full affect  Wt Readings from Last 3 Encounters:  11/21/16 177 lb 1.9 oz (80.3 kg)  11/13/16 171 lb 15.3 oz (78 kg)  09/27/16 177 lb 3.2 oz (80.4 kg)      Studies/Labs Reviewed:   EKG:  EKG is not ordered today.    Recent Labs: 11/13/2016: BUN 11; Creatinine, Ser 0.89; Hemoglobin 15.1; Magnesium 1.9; Platelets 165;  Potassium 3.7; Sodium 140   Lipid Panel    Component Value Date/Time   CHOL 95 06/03/2014 0739   TRIG 38.0 06/03/2014 0739   HDL 38.30 (L) 06/03/2014 0739   CHOLHDL 2 06/03/2014 0739   VLDL 7.6 06/03/2014 0739   LDLCALC 49 06/03/2014 0739    Additional studies/ records that were reviewed today include:   Echocardiogram:10/2013 LV EF: 35% -  40%  ------------------------------------------------------------ Indications:   CAD of arterial graft 414.04.  ------------------------------------------------------------ History:  PMH: Acquired from the patient and from the patient's chart. Ventricular ectopy. Coronary artery disease. Cardiomyopathy. Risk factors: Hypertension. Dyslipidemia.  ------------------------------------------------------------ Study Conclusions  - Left ventricle: The cavity size was normal. Wall thickness was normal. Systolic function was moderately reduced. The estimated ejection fraction was in the range of 35% to 40%. There is akinesis of the apical myocardium. Features are consistent with a pseudonormal left ventricular filling pattern, with concomitant abnormal relaxation and increased filling pressure (grade 2 diastolic dysfunction). - Aortic root: The aortic root  was mildly dilated. - Mitral valve: Mild regurgitation. - Left atrium: The atrium was mildly dilated. Impressions:  - Compared to study of 08/26/13, LV function appears to be similar.  Cardiac Catheterization: 11/12/16 Coronary Balloon Angioplasty  Intravascular Pressure Wire/FFR Study  Left Heart Cath and Coronary Angiography  Conclusion     Prox LAD to Dist LAD Stented segment, 75 %stenosed (In-Stent Re-stenosis). FFR 0.74  Post intervention - sequential PTCA with Angiosculpt and noncompliant balloons (tapered from 3.5 down to 2.8 mm), there is a 0% residual stenosis.  ______________________________________________  Prox RCA to Mid RCA Stent: 0 %stenosed.  The left ventricular ejection fraction is 35-45% by visual estimate. - Moderately reduced  LV end diastolic pressure is normal.   Severe in-stent restenosis of the entire stented segment of the LAD treated with sequential angioplasty with resolution of 75+ percent stenoses down to 0%.  The size of balloons used would suggest potentially undersized stents.  Moderate cardiomyopathy with akinesis to dyskinesis of the anterior inferior apex with mild aneurysmal dilation.  Plan: Transferred to 6 Central post procedure unit for TR band removal. Continue dual antiplatelet therapy Continue aggressive cardiac management.  Expect that he should be able to be discharged by tomorrow.     ASSESSMENT & PLAN:    1. CAD s/p PCI to LAD and RCA  - Now S/p PTCA of prio-dist LAD. Intermittent chest pain this has been improving. Less intensity and severity. Discussed possible initiation of Imdur however patient wants to defer at this time. He will let us know if symptoms get worse. Continue aspirin, Plavix, statin and Losartan  2. NSVT - During admission. supplemented with potassium and magnesium at discharge. Labs has been checking it by PCP yesterday per patient.  3. Chronic systolic CHF/ICM - He is euvolemic. EF of 35-45% on  cath. Continue ARB; not on beta blocker due to bradycardia. Will need repeat echo in 3 months during f/u appointment with Dr. Burt Knack. If EF still low consider ICD.  Discussrd heart failure symptoms in the details.  4. HLD - Continue statin. LDL checked in our system was in 2015. He says states that his LDL was normal during last checked in July 2017. He usually checks his cholesterol level at work. Advised to fax result.   5. HTN - Stable and well controlled. Soft today.    Medication Adjustments/Labs and Tests Ordered: Current medicines are reviewed at length with the patient today.  Concerns regarding medicines are outlined above.  Medication changes, Labs and Tests ordered today are listed in the Patient Instructions below. Patient Instructions  Medication Instructions:  Your physician recommends that you continue on your current medications as directed. Please refer to the Current Medication list given to you today.   Labwork: -None  Testing/Procedures: -None  Follow-Up: Your physician wants you to follow-up in: 3 months with Dr. Burt Knack.  You will receive a reminder letter in the mail two months in advance. If you don't receive a letter, please call our office to schedule the follow-up appointment.   Any Other Special Instructions Will Be Listed Below (If Applicable).     If you need a refill on your cardiac medications before your next appointment, please call your pharmacy.      Jarrett Soho, PA  11/21/2016 2:10 PM    [facility] [address], [place], [place]  [address] Phone: [phone]; Fax: [phone]

## 2016-11-21 ENCOUNTER — Encounter (INDEPENDENT_AMBULATORY_CARE_PROVIDER_SITE_OTHER): Payer: Self-pay

## 2016-11-21 ENCOUNTER — Ambulatory Visit (INDEPENDENT_AMBULATORY_CARE_PROVIDER_SITE_OTHER): Payer: BLUE CROSS/BLUE SHIELD | Admitting: Physician Assistant

## 2016-11-21 ENCOUNTER — Encounter: Payer: Self-pay | Admitting: Physician Assistant

## 2016-11-21 VITALS — BP 106/72 | HR 52 | Ht 64.0 in | Wt 177.1 lb

## 2016-11-21 DIAGNOSIS — E785 Hyperlipidemia, unspecified: Secondary | ICD-10-CM

## 2016-11-21 DIAGNOSIS — R001 Bradycardia, unspecified: Secondary | ICD-10-CM | POA: Diagnosis not present

## 2016-11-21 DIAGNOSIS — I255 Ischemic cardiomyopathy: Secondary | ICD-10-CM | POA: Diagnosis not present

## 2016-11-21 DIAGNOSIS — I2510 Atherosclerotic heart disease of native coronary artery without angina pectoris: Secondary | ICD-10-CM | POA: Diagnosis not present

## 2016-11-21 DIAGNOSIS — I5022 Chronic systolic (congestive) heart failure: Secondary | ICD-10-CM

## 2016-11-21 DIAGNOSIS — I10 Essential (primary) hypertension: Secondary | ICD-10-CM

## 2016-11-21 NOTE — Patient Instructions (Signed)
Medication Instructions:  Your physician recommends that you continue on your current medications as directed. Please refer to the Current Medication list given to you today.   Labwork: -None  Testing/Procedures: -None  Follow-Up: Your physician wants you to follow-up in: 3 months with Dr. Burt Knack.  You will receive a reminder letter in the mail two months in advance. If you don't receive a letter, please call our office to schedule the follow-up appointment.   Any Other Special Instructions Will Be Listed Below (If Applicable).     If you need a refill on your cardiac medications before your next appointment, please call your pharmacy.

## 2016-12-30 ENCOUNTER — Encounter: Payer: Self-pay | Admitting: Gastroenterology

## 2017-01-10 ENCOUNTER — Encounter (HOSPITAL_COMMUNITY): Payer: Self-pay | Admitting: Family

## 2017-01-10 NOTE — Progress Notes (Signed)
Mailed patient letter with information about Cardiac Rehab program. MW °

## 2017-01-31 ENCOUNTER — Other Ambulatory Visit: Payer: Self-pay | Admitting: Cardiovascular Disease

## 2017-02-20 ENCOUNTER — Encounter: Payer: Self-pay | Admitting: Cardiovascular Disease

## 2017-02-26 ENCOUNTER — Other Ambulatory Visit: Payer: Self-pay | Admitting: Physician Assistant

## 2017-03-10 ENCOUNTER — Ambulatory Visit (INDEPENDENT_AMBULATORY_CARE_PROVIDER_SITE_OTHER): Payer: BLUE CROSS/BLUE SHIELD | Admitting: Cardiovascular Disease

## 2017-03-10 ENCOUNTER — Encounter: Payer: Self-pay | Admitting: Cardiovascular Disease

## 2017-03-10 VITALS — BP 116/66 | HR 62 | Ht 65.0 in | Wt 184.0 lb

## 2017-03-10 DIAGNOSIS — I5022 Chronic systolic (congestive) heart failure: Secondary | ICD-10-CM | POA: Diagnosis not present

## 2017-03-10 DIAGNOSIS — I25118 Atherosclerotic heart disease of native coronary artery with other forms of angina pectoris: Secondary | ICD-10-CM

## 2017-03-10 NOTE — Patient Instructions (Signed)

## 2017-03-10 NOTE — Progress Notes (Signed)
Cardiology Office Note Date:  03/10/2017   ID:  Zachary Stone, DOB [birth year], MRN [id number]  PCP:  Smothers, Andree Elk, NP  Cardiologist:  Sherren Mocha, MD    Chief Complaint  Patient presents with  . Follow-up     History of Present Illness: Zachary Stone is a 61 y.o. male who presents for  follow-up of CAD. The patient has undergone multiple PCI procedures. He initially presented in 2003 with an anterior wall infarction. He underwent bare-metal stenting of the LAD. He has also undergone PCI of the RCA and a repeat PCI of the LAD. Because of chest pain and a decrease in his LV function, he underwent cardiac catheterization in 2014 and this demonstrated continued patency of his RCA stent, moderate mid LAD stenosis with no change from his previous catheterizations, and a widely patent left circumflex. He was also noted to have moderate segmental LV systolic dysfunction. At that time the patient was started on mexiletine because of a high PVC burden. A Holter monitor after initiation of mexiletine demonstrated marked reduction in PVCs.  Here alone today. Recently returned from [place]. Feels like he is 'tiring easily.' Reports exercise intolerance that he hasn't had in the past. Also reports a discomfort in the chest with exertion. This only happens with fairly heavy exertion such as hiking uphill. He describes it as a tiredness in the chest. No shortness of breath. No leg heaviness. He continues to have fleeting sharp chest pains when she's had for many years with no change in pattern.  Past Medical History:  Diagnosis Date  . Bradycardia    a. h/o significant bradycardia on beta blocker thus not on one.  . Chronic systolic CHF (congestive heart failure) ([place])   . Coronary artery disease    a. Ant MI 2003 tx with PCI to LAD. b. s/p PCI to RCA as well the LAD in January 2006. c. Atypical CP (anginal equivalent) 10/2016 s/p PTCA of LAD.  Marland Kitchen Diverticulitis    on CT 2012  . Diverticulosis      on CT scan 2009  . 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  Marland Kitchen Hx of cardiovascular stress test    ETT-Myoview (9/14):  Low risk, EF 44%, dAnt and apical scar, no ischemia  . Hyperlipidemia   . Hypertension   . Hypokalemia - borderline 11/13/2016  . NSVT (nonsustained ventricular tachycardia) ([place])    a. brief run 10/2016 (7bt) also on prob list from prior.    Past Surgical History:  Procedure Laterality Date  . CARDIAC CATHETERIZATION N/A 11/12/2016   Procedure: Left Heart Cath and Coronary Angiography;  Surgeon: Leonie Man, MD;  Location: [facility];  Service: Cardiovascular;  Laterality: N/A;  . CARDIAC CATHETERIZATION N/A 11/12/2016   Procedure: Intravascular Pressure Wire/FFR Study;  Surgeon: Leonie Man, MD;  Location: [facility];  Service: Cardiovascular;  Laterality: N/A;  . CARDIAC CATHETERIZATION N/A 11/12/2016   Procedure: Coronary Balloon Angioplasty;  Surgeon: Leonie Man, MD;  Location: [facility];  Service: Cardiovascular;  Laterality: N/A;  . CHOLECYSTECTOMY    . COLONOSCOPY  2007   Negative recheck 2017  . CORONARY ANGIOPLASTY WITH STENT PLACEMENT     Dr.Stuckey  . LEFT HEART CATHETERIZATION WITH CORONARY ANGIOGRAM N/A 09/09/2013   Procedure: LEFT HEART CATHETERIZATION WITH CORONARY ANGIOGRAM;  Surgeon: Blane Ohara, MD;  Location: [facility];  Service: Cardiovascular;  Laterality: N/A;  . TONSILLECTOMY    . VASECTOMY  Current Outpatient Prescriptions  Medication Sig Dispense Refill  . aspirin 81 MG tablet Take 81 mg by mouth daily.      Marland Kitchen atorvastatin (LIPITOR) 80 MG tablet Take 1 tablet (80 mg total) by mouth daily. 90 tablet 3  . clopidogrel (PLAVIX) 75 MG tablet TAKE ONE TABLET BY MOUTH DAILY 30 tablet 10  . losartan (COZAAR) 50 MG tablet Take 1 tablet (50 mg total) by mouth daily. 90 tablet 3  . Melatonin 1 MG TABS Take 0.5 mg by mouth at bedtime as needed (sleep).    . mexiletine (MEXITIL) 150 MG capsule Take 1  capsule (150 mg total) by mouth 2 (two) times daily. 180 capsule 3  . Multiple Vitamin (MULTI-VITAMINS) TABS Take 1 tablet by mouth daily.    . nitroGLYCERIN (NITROSTAT) 0.4 MG SL tablet Place 1 tablet (0.4 mg total) under the tongue every 5 (five) minutes as needed for chest pain (up to 3 doses). 25 tablet 3  . potassium chloride SA (K-DUR,KLOR-CON) 20 MEQ tablet TAKE 1 TABLET (20 MEQ TOTAL) BY MOUTH DAILY.(EVENING) 30 tablet 1   No current facility-administered medications for this visit.     Allergies:   Clams [shellfish allergy]; Morphine; and Iodine   Social History:  The patient  reports that he has never smoked. He has never used smokeless tobacco. He reports that he drinks alcohol. He reports that he does not use drugs.   Family History:  The patient's  family history includes Coronary artery disease in his mother; Skin cancer in his maternal uncle.    ROS:  Please see the history of present illness.  Otherwise, review of systems is positive for headaches.  All other systems are reviewed and negative.    PHYSICAL EXAM: VS:  BP 116/66   Pulse 62   Ht '5\' 5"'$  (1.651 m)   Wt 184 lb (83.5 kg)   BMI 30.62 kg/m  , BMI Body mass index is 30.62 kg/m. GEN: Well nourished, well developed, in no acute distress  HEENT: normal  Neck: no JVD, no masses. No carotid bruits Cardiac: RRR without murmur or gallop                Respiratory:  clear to auscultation bilaterally, normal work of breathing GI: soft, nontender, nondistended, + BS MS: no deformity or atrophy  Ext: no pretibial edema, pedal pulses 2+= bilaterally Skin: warm and dry, no rash Neuro:  Strength and sensation are intact Psych: euthymic mood, full affect  EKG:  EKG is not ordered today.  Recent Labs: 11/13/2016: BUN 11; Creatinine, Ser 0.89; Hemoglobin 15.1; Magnesium 1.9; Platelets 165; Potassium 3.7; Sodium 140   Lipid Panel     Component Value Date/Time   CHOL 95 06/03/2014 0739   TRIG 38.0 06/03/2014 0739    HDL 38.30 (L) 06/03/2014 0739   CHOLHDL 2 06/03/2014 0739   VLDL 7.6 06/03/2014 0739   LDLCALC 49 06/03/2014 0739      Wt Readings from Last 3 Encounters:  03/10/17 184 lb (83.5 kg)  11/21/16 177 lb 1.9 oz (80.3 kg)  11/13/16 171 lb 15.3 oz (78 kg)     Cardiac Studies Reviewed: Cardiac MR 01-04-2014: FINDINGS: 1. Left ventricle has normal size and thickness with moderately reduced left ventricular function (LVEF 41%). There is akinesis of the apical anterior and inferior walls, mid antero and inferoseptal walls, dyskinesis of the apical septal wall and true apex and hypokinesis of the mid anterior wall. No obvious thrombus but flow stasis was  seen in the LV apex.  The measurements are as follows:  LVEDV: 188 ml  LVESV: 111 ml  SV 77 ml  CO: 5.5 ml/min  Myo mass: 146 g  2. There is late gadolinium enhancement in the following territories:  Apical anterior wall: 50-75% (poor prognosis after revascularization)  Apical septal wall: 75-100 % (poor prognosis)  Apical inferior wall: 50-75% (poor prognosis)  True apex: 75-100 % (poor prognosis)  Mid anterior: 0-25% (good prognosis)  Mid anteroseptal wall: 75-100% (poor prognosis)  Mid inferoseptal: 50-75% (fair prognosis)  3. Right ventricle has normal size, thickness and systolic function (RVEF 37%) with no regional wall motion abnormalities.  The measurements are as follows:  RVEDV: 158 ml  RVESV: 80 ml  SV 78 ml  RVCO: 5.6 ml/min  4. Trace mitral regurgitation, mild tricuspid regurgitation.  5. Mildly dilated left atrium. PA = 29 mm, Aortic root 40 mm, STJ 27 mm, ascending aorta 30 mm  IMPRESSION: 1. There is moderate LV systolic dysfunction (LVEF 41%) with evidence of prior infarct in the mid LAD and distal PDA territory with poor prognosis for recovery.  Regional wall motion abnormalities as described above with a flow stasis in the apical portion of the  ventricle. NO obvious thrombus.  2. Normal RV size and function, no evidence of ARVC.  Cardiac Cath 11-12-2016:   Indications   Unstable angina (HCC) [I20.0 (ICD-10-CM)]  CAD S/P percutaneous coronary angioplasty [I25.10, Z98.61 (ICD-10-CM)]  Procedural Details/Technique   Technical Details Primary Physician: Smothers, Andree Elk, NP Primary Cardiologist: Dr. Burt Knack  Pt. Profile:  Mr. Schroth is a 61 year old Caucasian male with past medical history of chronic systolic heart failure, high PVC burden on mexiletine, hypertension, hyperlipidemia, and coronary artery disease s/p PCI to LAD (extensive stented segment) and RCA presented with chest discomfort lasting seconds at a time increasing in frequency in the last several month. It is also associated with exertion. Despite atypical presentation, this is actually quite similar to his previous angina. He is now referred for invasive evaluation for unstable angina.  Time Out: Verified patient identification, verified procedure, site/side was marked, verified correct patient position, special equipment/implants available, medications/allergies/relevent history reviewed, required imaging and test results available. Performed. Consent Signed.  Access:  RIGHT Radial Artery: 6 Fr sheath -- Seldinger technique using Angiocath Micropuncture Kit 10 mL radial cocktail IA; 4000 Units IV Heparin  Left Heart Catheterization: 5Fr Catheters advanced or exchanged over a J-wire under direct fluoroscopic guidance into the ascending aorta; TIG 4.0 catheter advanced first.  Left & Right Coronary Artery Cineangiography: TIG 4.0 Catheter  LV Hemodynamics (LV Gram): TIG 4.0 Catheter  Initial cath findings showed what looks to be possible severe in-stent restenosis in the diffusely stented LAD segment. Durations are made for FFR guided PTCA of this segment. An additional bolus of 4000 units of heparin was administered. The patient is artery on aspirin and  Plavix.  FFR guided PTCA performed: See FINDINGS  Following PTCA, the catheter was completely removed out of the body over wire without complications.  RADIAL Sheath(s) removed in the Cath Lab with TR band placed for hemostasis.   TR Band: 1630 Hours; 11 mL air  MEDICATIONS * 75 g IV fentanyl, 3 mgIV Versed * SQ Lidocaine 7m * Radial Cocktail: 3 mg verapamil in 10 mL NS * Isovue Contrast: 135 mL * Heparin: 4000 Units followed by additional bolus of 4000 Units for PTCA * FFR: Adenosine infusion at 140 g/KG/min X 2 min * IC NTG 200 g  2   Estimated blood loss <50 mL.  During this procedure the patient was administered the following to achieve and maintain moderate conscious sedation: Versed 3 mg, Fentanyl 75 mcg, while the patient's heart rate, blood pressure, and oxygen saturation were continuously monitored. The period of conscious sedation was 64 minutes, of which I was present face-to-face 100% of this time.    Coronary Findings   Dominance: Right  Left Main  Vessel is large.  Left Anterior Descending  Ost LAD lesion, 35% stenosed. The lesion is tubular and smooth. This is right at the point of a take a very small branch that is subtotally occluded. It has the appearance of the slight edge. This is similar in stenosis from before, but the vessel was not included before.  Prox LAD to Dist LAD lesion, 75% stenosed. The lesion was previously treated using a drug eluting stent over 2 years ago. Pressure wire/FFR was performed on the lesion. FFR: 0.74. Baseline 0.89 (pullback to proximal portion of stent - back to 0.9 with Adenosine running.  Angioplasty: Lesion length: 60 mm. Lesion crossed with guidewire using a GUIDEWIRE COMET PRESSURE. Angioplasty alone was performed using a BALLOON ANGIOSCULPT RX 3.5X10. Maximum pressure: 16 atm. Inflation time: 60 sec. Initial Angioplasty to distal stent: BALLOON ANGIOSCULPT RX 2.5X15 [phone]) - up to 12 Atm (2.8 mm) BALLOON [place] EMERGE MR 3.0X20  (03888): for Proximal & mid stented segment (not to distal end): 14 Atm x 90 Sec BALLOON ANGIOSCULPT RX 3.5X10 [phone]) - for Proximal stent to just past D2. The pre-interventional distal flow is normal (TIMI 3). The post-interventional distal flow is normal (TIMI 3). The intervention was successful . No complications occurred at this lesion. Pressure wire/FFR was performed on the lesion using a GUIDEWIRE COMET PRESSURE. CATH VISTA GUIDE 6FR XBLAD3.5 ([id number])  There is no residual stenosis post intervention.  First Diagonal Branch  Vessel is small in size.  First Septal Branch  Vessel is moderate in size.  Second Diagonal Branch  Vessel is moderate in size.  Second Septal Branch  Vessel is small in size.  Third Septal Branch  Vessel is small in size.  Ramus Intermedius  Vessel is moderate in size. Vessel is angiographically normal.  Lateral Ramus Intermedius  Vessel is small in size.  Left Circumflex  First Obtuse Marginal Branch  Vessel is moderate in size.  Lateral First Obtuse Marginal Branch  Vessel is small in size.  Second Obtuse Marginal Branch  Vessel is large in size. Vessel is angiographically normal.  Lateral Second Obtuse Marginal Branch  Vessel is small in size.  Third Obtuse Marginal Branch  Vessel is angiographically normal.  Right Coronary Artery  Vessel is large.  Prox RCA lesion, 30% stenosed. The lesion is located at the bend.  Prox RCA to Mid RCA lesion, 0% stenosed. Previously placed Prox RCA to Mid RCA stent (unknown type) is widely patent.  Dist RCA lesion, 30% stenosed. The lesion is located at the bend and smooth.  Acute Marginal Branch  Vessel is small in size.  Right Posterior Descending Artery  Vessel is moderate in size. Vessel is angiographically normal.  Inferior Septal  Vessel is small in size.  Right Posterior Atrioventricular Branch  Vessel is moderate in size.  First Right Posterolateral  Vessel is small in size. Vessel is angiographically  normal.  Second Right Posterolateral  Vessel is small in size. Vessel is angiographically normal.  Wall Motion  Left Heart   Left Ventricle The left ventricular size is in the upper limits of normal. There is moderate left ventricular systolic dysfunction. LV end diastolic pressure is normal. The left ventricular ejection fraction is 35-45% by visual estimate. There are LV function abnormalities due to segmental dysfunction.    Mitral Valve There is trivial (1+) mitral regurgitation.    Aortic Valve There is no aortic valve stenosis.    Coronary Diagrams   Diagnostic Diagram       Post-Intervention Diagram         ASSESSMENT AND PLAN: 1.  CAD, native vessel, With angina: The patient's medical program is reviewed. He has not been able to tolerate blockers in the past. He is having headaches and isosorbide is probably not a good option for him.  I spent a lot of time with him reviewing the specifics of his coronary history today. He initially presented in 2003 with an anterior wall MI. He was treated with overlapping 2.25 mm express bare-metal stents in the LAD. He then returned in 2006 with angina and was treated with a 3.0 x 18 mm Cypher DES in the right coronary artery. He returned about one week later for repeat stenting of the LAD with a Cypher stent inside of the old bare-metal stents. He returned in 2010 with PTCA of the LAD. He had cardiac catheterizations in 2012 and 2014 without interventional procedures done. He returned again in 2017 and underwent FFR analysis of the LAD and extensive PTCA without more stenting. The RCA stent was widely patent at that time.  We extensively discussed the natural history of coronary artery disease. I think his treatment options would be somewhat limited as the most likely area of restenosis is again in the LAD where he already has 2 layers of stents. He prefers to continue with medical therapy which I agree with that approach.  He is not very limited with CCS class II symptoms at present. He will return for follow-up in 6 months. He understands to contact me if progressive symptoms arise.  2. Chronic systolic heart failure, NYHA II: He continues on losartan. Unable to tolerate beta blockers. Euvolemic on exam.  3. Hyperlipidemia: treated with a high-intensity statin (atorva 80 mg)  4. Symptomatic PVC's: The patient is treated with maxilla 10. He is followed by Dr. Caryl Comes.   Current medicines are reviewed with the patient today.  The patient does not have concerns regarding medicines.  Labs/ tests ordered today include:  No orders of the defined types were placed in this encounter.   Disposition:   FU 6 months  Signed, Sherren Mocha, MD  03/10/2017 5:51 PM    [facility] [address], [place], [place]  [address] Phone: [phone]; Fax: [phone]

## 2017-04-29 ENCOUNTER — Other Ambulatory Visit: Payer: Self-pay | Admitting: Cardiovascular Disease

## 2017-09-29 ENCOUNTER — Other Ambulatory Visit: Payer: Self-pay | Admitting: Cardiovascular Disease

## 2017-10-07 ENCOUNTER — Encounter: Payer: Self-pay | Admitting: Nurse Practitioner

## 2017-10-07 ENCOUNTER — Ambulatory Visit: Payer: BLUE CROSS/BLUE SHIELD | Admitting: Nurse Practitioner

## 2017-10-07 VITALS — BP 116/74 | HR 61 | Temp 97.8°F | Ht 65.0 in | Wt 193.0 lb

## 2017-10-07 DIAGNOSIS — M25552 Pain in left hip: Secondary | ICD-10-CM

## 2017-10-07 NOTE — Patient Instructions (Addendum)
He declined used of muscle relaxant.  Please sign medical record to get lab results from [facility] health: Zachary Stone, Zachary Stone.  Alternate between warm and cold compress as needed.  Do stretch exercise once a day.  May use tylenol 500mg  every 6hrs as needed for pain.  RETURN TO OFFICE IF PAIN WORSEN OR DEVELOPS ANY OF THE FOLLOWING SYMPTOMS: change in bowel or bladder function, leg weakness, joint swelling or redness, rash, fever, numbness or tinging.  Hip Exercises Ask your health care provider which exercises are safe for you. Do exercises exactly as told by your health care provider and adjust them as directed. It is normal to feel mild stretching, pulling, tightness, or discomfort as you do these exercises, but you should stop right away if you feel sudden pain or your pain gets worse.Do not begin these exercises until told by your health care provider. STRETCHING AND RANGE OF MOTION EXERCISES These exercises warm up your muscles and joints and improve the movement and flexibility of your hip. These exercises also help to relieve pain, numbness, and tingling. Exercise A: Hamstrings, Supine  1. Lie on your back. 2. Loop a belt or towel over the ball of your left / rightfoot. The ball of your foot is on the walking surface, right under your toes. 3. Straighten your left / rightknee and slowly pull on the belt to raise your leg. ? Do not let your left / right knee bend while you do this. ? Keep your other leg flat on the floor. ? Raise the left / right leg until you feel a gentle stretch behind your left / right knee or thigh. 4. Hold this position for ___15_______ seconds. 5. Slowly return your leg to the starting position. Repeat _____10_____ times. Complete this stretch _______1___ times a day. Exercise B: Hip Rotators  1. Lie on your back on a firm surface. 2. Hold your left / right knee with your left / right hand. Hold your ankle with your other hand. 3. Gently pull your left / right  knee and rotate your lower leg toward your other shoulder. ? Pull until you feel a stretch in your buttocks. ? Keep your hips and shoulders firmly planted while you do this stretch. 4. Hold this position for __________ seconds. Repeat __________ times. Complete this stretch __________ times a day. Exercise C: V-Sit (Hamstrings and Adductors)  1. Sit on the floor with your legs extended in a large "V" shape. Keep your knees straight during this exercise. 2. Start with your head and chest upright, then bend at your waist to reach for your left foot (position A). You should feel a stretch in your right inner thigh. 3. Hold this position for __________ seconds. Then slowly return to the upright position. 4. Bend at your waist to reach forward (position B). You should feel a stretch behind both of your thighs and knees. 5. Hold this position for __________ seconds. Then slowly return to the upright position. 6. Bend at your waist to reach for your right foot (position C). You should feel a stretch in your left inner thigh. 7. Hold this position for __________ seconds. Then slowly return to the upright position. Repeat __________ times. Complete this stretch __________ times a day. Exercise D: Lunge (Hip Flexors)  1. Place your left / right knee on the floor and bend your other knee so that is directly over your ankle. You should be half-kneeling. 2. Keep good posture with your head over your shoulders. 3. Tighten your  buttocks to point your tailbone downward. This helps your back to keep from arching too much. 4. You should feel a gentle stretch in the front of your left / right thigh and hip. If you do not feel any resistance, slightly slide your other foot forward and then slowly lunge forward so your knee once again lines up over your ankle. 5. Make sure your tailbone continues to point downward. 6. Hold this position for __________ seconds. Repeat __________ times. Complete this stretch  __________ times a day. STRENGTHENING EXERCISES These exercises build strength and endurance in your hip. Endurance is the ability to use your muscles for a long time, even after they get tired. Exercise E: Bridge (Hip Extensors)  1. Lie on your back on a firm surface with your knees bent and your feet flat on the floor. 2. Tighten your buttocks muscles and lift your bottom off the floor until the trunk of your body is level with your thighs. ? Do not arch your back. ? You should feel the muscles working in your buttocks and the back of your thighs. If you do not feel these muscles, slide your feet 1-2 inches (2.5-5 cm) farther away from your buttocks. 3. Hold this position for __________ seconds. 4. Slowly lower your hips to the starting position. 5. Let your muscles relax completely between repetitions. 6. If this exercise is too easy, try doing it with your arms crossed over your chest. Repeat __________ times. Complete this exercise __________ times a day. Exercise F: Straight Leg Raises - Hip Abductors  1. Lie on your side with your left / right leg in the top position. Lie so your head, shoulder, knee, and hip line up with each other. You may bend your bottom knee to help you balance. 2. Roll your hips slightly forward, so your hips are stacked directly over each other and your left / right knee is facing forward. 3. Leading with your heel, lift your top leg 4-6 inches (10-15 cm). You should feel the muscles in your outer hip lifting. ? Do not let your foot drift forward. ? Do not let your knee roll toward the ceiling. 4. Hold this position for __________ seconds. 5. Slowly return to the starting position. 6. Let your muscles relax completely between repetitions. Repeat __________ times. Complete this exercise __________ times a day. Exercise G: Straight Leg Raises - Hip Adductors  1. Lie on your side with your left / right leg in the bottom position. Lie so your head, shoulder,  knee, and hip line up. You may place your upper foot in front to help you balance. 2. Roll your hips slightly forward, so your hips are stacked directly over each other and your left / right knee is facing forward. 3. Tense the muscles in your inner thigh and lift your bottom leg 4-6 inches (10-15 cm). 4. Hold this position for __________ seconds. 5. Slowly return to the starting position. 6. Let your muscles relax completely between repetitions. Repeat __________ times. Complete this exercise __________ times a day. Exercise H: Straight Leg Raises - Quadriceps  1. Lie on your back with your left / right leg extended and your other knee bent. 2. Tense the muscles in the front of your left / right thigh. When you do this, you should see your kneecap slide up or see increased dimpling just above your knee. 3. Tighten these muscles even more and raise your leg 4-6 inches (10-15 cm) off the floor. 4. Hold this position  for __________ seconds. 5. Keep these muscles tense as you lower your leg. 6. Relax the muscles slowly and completely between repetitions. Repeat __________ times. Complete this exercise __________ times a day. Exercise I: Hip Abductors, Standing 1. Tie one end of a rubber exercise band or tubing to a secure surface, such as a table or pole. 2. Loop the other end of the band or tubing around your left / right ankle. 3. Keeping your ankle with the band or tubing directly opposite of the secured end, step away until there is tension in the tubing or band. Hold onto a chair as needed for balance. 4. Lift your left / right leg out to your side. While you do this: ? Keep your back upright. ? Keep your shoulders over your hips. ? Keep your toes pointing forward. ? Make sure to use your hip muscles to lift your leg. Do not "throw" your leg or tip your body to lift your leg. 5. Hold this position for __________ seconds. 6. Slowly return to the starting position. Repeat __________ times.  Complete this exercise __________ times a day. Exercise J: Squats (Quadriceps) 1. Stand in a door frame so your feet and knees are in line with the frame. You may place your hands on the frame for balance. 2. Slowly bend your knees and lower your hips like you are going to sit in a chair. ? Keep your lower legs in a straight-up-and-down position. ? Do not let your hips go lower than your knees. ? Do not bend your knees lower than told by your health care provider. ? If your hip pain increases, do not bend as low. 3. Hold this position for ___________ seconds. 4. Slowly push with your legs to return to standing. Do not use your hands to pull yourself to standing. Repeat __________ times. Complete this exercise __________ times a day. This information is not intended to replace advice given to you by your health care provider. Make sure you discuss any questions you have with your health care provider. Document Released: 11/22/2005 Document Revised: 07/29/2016 Document Reviewed: 10/30/2015 Elsevier Interactive Patient Education  [employer].

## 2017-10-07 NOTE — Progress Notes (Signed)
Subjective:  Patient ID: Zachary Stone, male    DOB: 12-04-1955  Age: 61 y.o. MRN: [id number]  CC: Establish Care (new patient-tdap?) and Spasms (left groind area sore--1 mo)   Hip Pain   The incident occurred more than 1 week ago. The incident occurred at home. The injury mechanism was a twisting injury. The pain is present in the left thigh (left groin). The quality of the pain is described as aching. The pain is moderate. The pain has been intermittent since onset. Pertinent negatives include no inability to bear weight, loss of motion, loss of sensation, muscle weakness, numbness or tingling. The symptoms are aggravated by weight bearing. He has tried rest for the symptoms. The treatment provided mild relief.  Groin pain x 15month: Onset with recent URI (coughing) which has now resolved but pain is still presnt. Pain is worse with pulling down christmas  Left groin pain. No radiation. No weakness. No OTCused. Worse with prolonged standing )>1hr).  Cardiomyopathy with V-Tach, CAD s/p angioplasty with stents and HTN: Followed by cardiology: Dr. Copper. Current use of losartan, plavix, mexitil, potassium. BP Readings from Last 3 Encounters:  10/07/17 116/74  03/10/17 116/66  11/21/16 106/72   Chronic Hearing Loss with use of bilateral hearing aids.  cologuard done last year.(normal per patiet).  Hepatitis done within last 2years per patient by [facility] (negative per patient).  Does not want Tdap vaccine at this time.  Reviewed patient Family hx and medical Hx.  Outpatient Medications Prior to Visit  Medication Sig Dispense Refill  . aspirin 81 MG tablet Take 81 mg by mouth daily.      Marland Kitchen atorvastatin (LIPITOR) 80 MG tablet Take 1 tablet (80 mg total) by mouth daily. 90 tablet 3  . clopidogrel (PLAVIX) 75 MG tablet TAKE ONE TABLET BY MOUTH DAILY 30 tablet 10  . losartan (COZAAR) 50 MG tablet Take 1 tablet (50 mg total) by mouth daily. 90 tablet 3  . mexiletine (MEXITIL) 150  MG capsule TAKE ONE CAPSULE BY MOUTH TWICE A DAY 60 capsule 5  . Multiple Vitamin (MULTI-VITAMINS) TABS Take 1 tablet by mouth daily.    . nitroGLYCERIN (NITROSTAT) 0.4 MG SL tablet Place 1 tablet (0.4 mg total) under the tongue every 5 (five) minutes as needed for chest pain (up to 3 doses). 25 tablet 3  . potassium chloride SA (K-DUR,KLOR-CON) 20 MEQ tablet TAKE 1 TABLET (20 MEQ TOTAL) BY MOUTH EVERY EVENING) 30 tablet 9  . Melatonin 1 MG TABS Take 0.5 mg by mouth at bedtime as needed (sleep).     No facility-administered medications prior to visit.     ROS See HPI  Objective:  BP 116/74   Pulse 61   Temp 97.8 F (36.6 C)   Ht 5\' 5"  (1.651 m)   Wt 193 lb (87.5 kg)   SpO2 96%   BMI 32.12 kg/m   BP Readings from Last 3 Encounters:  10/07/17 116/74  03/10/17 116/66  11/21/16 106/72    Wt Readings from Last 3 Encounters:  10/07/17 193 lb (87.5 kg)  03/10/17 184 lb (83.5 kg)  11/21/16 177 lb 1.9 oz (80.3 kg)    Physical Exam  Constitutional: He is oriented to person, place, and time. No distress.  Cardiovascular: Normal rate and regular rhythm.  Pulmonary/Chest: Effort normal and breath sounds normal.  Abdominal: Hernia confirmed negative in the right inguinal area and confirmed negative in the left inguinal area.  Genitourinary: Testes normal. Right testis shows no swelling  and no tenderness. Left testis shows no swelling and no tenderness. Uncircumcised.  Musculoskeletal: He exhibits no edema or tenderness.       Left hip: Normal.       Left knee: Normal.       Lumbar back: Normal.       Left upper leg: Normal.  Lymphadenopathy:       Right: No inguinal adenopathy present.       Left: No inguinal adenopathy present.  Neurological: He is alert and oriented to person, place, and time.  Skin: Skin is warm and dry.  Vitals reviewed.   Lab Results  Component Value Date   WBC 7.8 11/13/2016   HGB 15.1 11/13/2016   HCT 41.6 11/13/2016   PLT 165 11/13/2016   GLUCOSE  107 (H) 11/13/2016   CHOL 95 06/03/2014   TRIG 38.0 06/03/2014   HDL 38.30 (L) 06/03/2014   LDLCALC 49 06/03/2014   ALT 27 06/03/2014   AST 21 06/03/2014   NA 140 11/13/2016   K 3.7 11/13/2016   CL 107 11/13/2016   CREATININE 0.89 11/13/2016   BUN 11 11/13/2016   CO2 28 11/13/2016   TSH 0.40 07/12/2013   PSA 1.07 10/01/2010   INR 0.96 11/12/2016    Dg Chest 2 View  Result Date: 11/11/2016 CLINICAL DATA:  Chest pain mid to LEFT side, chronic chest pain for years but worse this morning, history hypertension, hyperlipidemia EXAM: CHEST  2 VIEW COMPARISON:  11/20/2010 FINDINGS: Normal heart size, mediastinal contours, and pulmonary vascularity. Coronary arterial stents noted. Chronic peribronchial thickening with minimal subsegmental atelectasis at LEFT costophrenic angle. No acute infiltrate, pleural effusion or pneumothorax. Bones unremarkable. Surgical clips RIGHT upper quadrant question cholecystectomy. IMPRESSION: Prior coronary PTCA. Bronchitic changes with minimal LEFT basilar subsegmental atelectasis. Electronically Signed   By: Lavonia Dana M.D.   On: 11/11/2016 13:44    Assessment & Plan:   Zachary Stone was seen today for establish care and spasms.  Diagnoses and all orders for this visit:  Acute hip pain, left   I am having Zachary Stone maintain his aspirin, atorvastatin, losartan, nitroGLYCERIN, MULTI-VITAMINS, clopidogrel, Melatonin, potassium chloride SA, and mexiletine.  No orders of the defined types were placed in this encounter.   Follow-up: Return in about 3 months (around 01/07/2018) for CPE (fasting).  Wilfred Lacy, NP

## 2017-11-05 ENCOUNTER — Other Ambulatory Visit: Payer: Self-pay | Admitting: Cardiovascular Disease

## 2018-01-01 ENCOUNTER — Encounter: Payer: Self-pay | Admitting: Cardiovascular Disease

## 2018-01-08 ENCOUNTER — Encounter: Payer: Self-pay | Admitting: Cardiovascular Disease

## 2018-01-08 ENCOUNTER — Ambulatory Visit (INDEPENDENT_AMBULATORY_CARE_PROVIDER_SITE_OTHER): Payer: BLUE CROSS/BLUE SHIELD | Admitting: Nurse Practitioner

## 2018-01-08 ENCOUNTER — Ambulatory Visit: Payer: BLUE CROSS/BLUE SHIELD | Admitting: Cardiovascular Disease

## 2018-01-08 ENCOUNTER — Encounter: Payer: Self-pay | Admitting: Nurse Practitioner

## 2018-01-08 VITALS — BP 116/72 | HR 61 | Temp 97.7°F | Ht 65.0 in | Wt 192.0 lb

## 2018-01-08 VITALS — BP 116/80 | HR 69 | Ht 64.0 in | Wt 197.8 lb

## 2018-01-08 DIAGNOSIS — Z6832 Body mass index (BMI) 32.0-32.9, adult: Secondary | ICD-10-CM

## 2018-01-08 DIAGNOSIS — R350 Frequency of micturition: Secondary | ICD-10-CM

## 2018-01-08 DIAGNOSIS — E782 Mixed hyperlipidemia: Secondary | ICD-10-CM | POA: Diagnosis not present

## 2018-01-08 DIAGNOSIS — I255 Ischemic cardiomyopathy: Secondary | ICD-10-CM

## 2018-01-08 DIAGNOSIS — Z1211 Encounter for screening for malignant neoplasm of colon: Secondary | ICD-10-CM | POA: Diagnosis not present

## 2018-01-08 DIAGNOSIS — I25118 Atherosclerotic heart disease of native coronary artery with other forms of angina pectoris: Secondary | ICD-10-CM | POA: Diagnosis not present

## 2018-01-08 DIAGNOSIS — E66811 Obesity, class 1: Secondary | ICD-10-CM | POA: Insufficient documentation

## 2018-01-08 DIAGNOSIS — E669 Obesity, unspecified: Secondary | ICD-10-CM

## 2018-01-08 DIAGNOSIS — I5022 Chronic systolic (congestive) heart failure: Secondary | ICD-10-CM | POA: Diagnosis not present

## 2018-01-08 DIAGNOSIS — H6122 Impacted cerumen, left ear: Secondary | ICD-10-CM | POA: Diagnosis not present

## 2018-01-08 DIAGNOSIS — Z0001 Encounter for general adult medical examination with abnormal findings: Secondary | ICD-10-CM

## 2018-01-08 DIAGNOSIS — R972 Elevated prostate specific antigen [PSA]: Secondary | ICD-10-CM | POA: Diagnosis not present

## 2018-01-08 DIAGNOSIS — N401 Enlarged prostate with lower urinary tract symptoms: Secondary | ICD-10-CM | POA: Diagnosis not present

## 2018-01-08 LAB — CBC
HCT: 48.5 % (ref 39.0–52.0)
Hemoglobin: 17.2 g/dL — ABNORMAL HIGH (ref 13.0–17.0)
MCHC: 35.4 g/dL (ref 30.0–36.0)
MCV: 91.9 fl (ref 78.0–100.0)
Platelets: 206 10*3/uL (ref 150.0–400.0)
RBC: 5.28 Mil/uL (ref 4.22–5.81)
RDW: 13.1 % (ref 11.5–15.5)
WBC: 7.3 10*3/uL (ref 4.0–10.5)

## 2018-01-08 LAB — LIPID PANEL
CHOL/HDL RATIO: 3
Cholesterol: 124 mg/dL (ref 0–200)
HDL: 43 mg/dL (ref >39.00)
LDL CALC: 63 mg/dL (ref 0–99)
NONHDL: 80.76
Triglycerides: 87 mg/dL (ref 0.0–149.0)
VLDL: 17.4 mg/dL (ref 0.0–40.0)

## 2018-01-08 LAB — HEPATIC FUNCTION PANEL
ALK PHOS: 83 U/L (ref 39–117)
ALT: 25 U/L (ref 0–53)
AST: 19 U/L (ref 0–37)
Albumin: 3.9 g/dL (ref 3.5–5.2)
BILIRUBIN DIRECT: 0.2 mg/dL (ref 0.0–0.3)
BILIRUBIN TOTAL: 1 mg/dL (ref 0.2–1.2)
Total Protein: 6.5 g/dL (ref 6.0–8.3)

## 2018-01-08 LAB — TSH: TSH: 0.81 u[IU]/mL (ref 0.35–4.50)

## 2018-01-08 LAB — PSA: PSA: 7.43 ng/mL — AB (ref 0.10–4.00)

## 2018-01-08 MED ORDER — LOSARTAN POTASSIUM 50 MG PO TABS: 50.0000 mg | ORAL_TABLET | Freq: Every day | ORAL | 11 refills | Status: AC

## 2018-01-08 MED ORDER — CLOPIDOGREL BISULFATE 75 MG PO TABS: 75.0000 mg | ORAL_TABLET | Freq: Every day | ORAL | 11 refills | Status: AC

## 2018-01-08 MED ORDER — NITROGLYCERIN 0.4 MG SL SUBL: 0.4000 mg | SUBLINGUAL_TABLET | SUBLINGUAL | 3 refills | Status: AC | PRN

## 2018-01-08 MED ORDER — TAMSULOSIN HCL 0.4 MG PO CAPS: 0.4000 mg | ORAL_CAPSULE | Freq: Every day | ORAL | 0 refills | Status: DC

## 2018-01-08 MED ORDER — ATORVASTATIN CALCIUM 80 MG PO TABS: 80.0000 mg | ORAL_TABLET | Freq: Every day | ORAL | 11 refills | Status: AC

## 2018-01-08 NOTE — Patient Instructions (Signed)
Medication Instructions:  Your provider recommends that you continue on your current medications as directed. Please refer to the Current Medication list given to you today.    Labwork: None  Testing/Procedures: Your provider has requested that you have an echocardiogram. Echocardiography is a painless test that uses sound waves to create images of your heart. It provides your doctor with information about the size and shape of your heart and how well your heart's chambers and valves are working. This procedure takes approximately one hour. There are no restrictions for this procedure.  Follow-Up: Your provider wants you to follow-up in: 6 months with Dr. Cooper. You will receive a reminder letter in the mail two months in advance. If you don't receive a letter, please call our office to schedule the follow-up appointment.    Any Other Special Instructions Will Be Listed Below (If Applicable).     If you need a refill on your cardiac medications before your next appointment, please call your pharmacy.   

## 2018-01-08 NOTE — Progress Notes (Signed)
Subjective:    Patient ID: Zachary Stone, male    DOB: 1956/09/01, 62 y.o.   MRN: [id number]  Patient presents today for complete physical  Urinary Frequency   This is a recurrent problem. The current episode started more than 1 month ago. The problem occurs every urination. The problem has been gradually worsening. The pain is at a severity of 0/10. The patient is experiencing no pain. There has been no fever. He is sexually active. There is no history of pyelonephritis. Associated symptoms include frequency, hesitancy and urgency. Pertinent negatives include no chills, discharge, flank pain, hematuria, nausea, possible pregnancy, sweats or vomiting. He has tried nothing for the symptoms. His past medical history is significant for urinary stasis. There is no history of catheterization, kidney stones, recurrent UTIs, a single kidney or a urological procedure.   Next appt with cardiology 01/08/2018.  Has bilateral hearing loss but does not want to see audiology at this time.  Immunizations: (TDAP, Hep C screen, Pneumovax, Influenza, zoster)  Health Maintenance  Topic Date Due  . Flu Shot  03/02/2018*  . Tetanus Vaccine  01/08/2019*  . HIV Screening  01/08/2019*  . Colon Cancer Screening  12/14/2018  .  Hepatitis C: One time screening is recommended by Center for Disease Control  (CDC) for  adults born from [birth year] through 1967.   Completed  *Topic was postponed. The date shown is not the original due date.   Diet:regular.  Weight:  Wt Readings from Last 3 Encounters:  01/08/18 197 lb 12.8 oz (89.7 kg)  01/08/18 192 lb (87.1 kg)  10/07/17 193 lb (87.5 kg)   Exercise:none.  Fall Risk: Fall Risk  01/08/2018  Falls in the past year? No   Home Safety:home with wife  Depression/Suicide: Depression screen [facility] 2/9 01/08/2018  Decreased Interest 0  Down, Depressed, Hopeless 0  PHQ - 2 Score 0   PSA (yearly, >32yrs):needed.  Vision:up to date.  Dental:up to date.  Advanced  Directive: Advanced Directives 11/11/2016  Does Patient Have a Medical Advance Directive? No  Would patient like information on creating a medical advance directive? No - Patient declined   Medications and allergies reviewed with patient and updated if appropriate.  Patient Active Problem List   Diagnosis Date Noted  . Elevated PSA, less than 10 ng/ml 01/08/2018  . Class 1 obesity without serious comorbidity with body mass index (BMI) of 32.0 to 32.9 in adult 01/08/2018  . Frequent PVCs 11/13/2016  . Hypokalemia - borderline 11/13/2016  . Stenosis of coronary stent   . Chronic systolic heart failure ([place]) 11/11/2016  . Ischemic cardiomyopathy 11/11/2016  . Unstable angina ([place])   . Coronary artery disease involving native coronary artery of native heart with unstable angina pectoris ([place])   . Viral URI with cough 01/14/2014  . 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 Sinus bradycardia 10/05/2013  . Ventricular tachycardia, non-sustained//PVC 08/30/2013  . Dizziness 08/30/2013  . Hearing loss of both ears 03/26/2013  . Tinnitus of both ears 12/02/2011  . Diverticulitis 05/09/2011  . Eustachian tube dysfunction 05/09/2011  . PLANTAR FASCIITIS, LEFT 10/23/2010  . BPH (benign prostatic hyperplasia) 09/28/2010  . Benign essential tremor 09/28/2010  . NEVUS 07/06/2009  . SEBORRHEIC KERATOSIS 07/06/2009  . Hyperlipidemia, unspecified 08/29/2008  . Essential hypertension 08/29/2008  . MYOCARDIAL INFARCTION, HX OF 03/30/2007  . CORONARY ARTERY DISEASE 03/30/2007  . EPICONDYLITIS, HX OF 03/30/2007    Current Outpatient Medications on File Prior to Visit  Medication Sig  Dispense Refill  . aspirin 81 MG tablet Take 81 mg by mouth daily.      . Melatonin 1 MG TABS Take 0.5 mg by mouth at bedtime as needed (sleep).    . mexiletine (MEXITIL) 150 MG capsule TAKE ONE CAPSULE BY MOUTH TWICE A DAY 60 capsule 5  . Multiple Vitamin (MULTI-VITAMINS) TABS Take 1 tablet by mouth  daily.    . potassium chloride SA (K-DUR,KLOR-CON) 20 MEQ tablet TAKE 1 TABLET (20 MEQ TOTAL) BY MOUTH EVERY EVENING) 30 tablet 9   No current facility-administered medications on file prior to visit.     Past Medical History:  Diagnosis Date  . Bradycardia    a. h/o significant bradycardia on beta blocker thus not on one.  . Chronic systolic CHF (congestive heart failure) ([place])   . Coronary artery disease    a. Ant MI 2003 tx with PCI to LAD. b. s/p PCI to RCA as well the LAD in January 2006. c. Atypical CP (anginal equivalent) 10/2016 s/p PTCA of LAD.  Marland Kitchen Diverticulitis    on CT 2012  . Diverticulosis    on CT scan 2009  . 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  Marland Kitchen Hx of cardiovascular stress test    ETT-Myoview (9/14):  Low risk, EF 44%, dAnt and apical scar, no ischemia  . Hyperlipidemia   . Hypertension   . Hypokalemia - borderline 11/13/2016  . NSVT (nonsustained ventricular tachycardia) ([place])    a. brief run 10/2016 (7bt) also on prob list from prior.    Past Surgical History:  Procedure Laterality Date  . CARDIAC CATHETERIZATION N/A 11/12/2016   Procedure: Left Heart Cath and Coronary Angiography;  Surgeon: Leonie Man, MD;  Location: [facility];  Service: Cardiovascular;  Laterality: N/A;  . CARDIAC CATHETERIZATION N/A 11/12/2016   Procedure: Intravascular Pressure Wire/FFR Study;  Surgeon: Leonie Man, MD;  Location: [facility];  Service: Cardiovascular;  Laterality: N/A;  . CARDIAC CATHETERIZATION N/A 11/12/2016   Procedure: Coronary Balloon Angioplasty;  Surgeon: Leonie Man, MD;  Location: [facility];  Service: Cardiovascular;  Laterality: N/A;  . CHOLECYSTECTOMY    . COLONOSCOPY  2007   Negative recheck 2017  . CORONARY ANGIOPLASTY WITH STENT PLACEMENT     Dr.Stuckey  . LEFT HEART CATHETERIZATION WITH CORONARY ANGIOGRAM N/A 09/09/2013   Procedure: LEFT HEART CATHETERIZATION WITH CORONARY ANGIOGRAM;  Surgeon: Blane Ohara, MD;   Location: [facility];  Service: Cardiovascular;  Laterality: N/A;  . TONSILLECTOMY    . VASECTOMY      Social History   Socioeconomic History  . Marital status: Married    Spouse name: None  . Number of children: 3  . Years of education: None  . Highest education level: None  Social Needs  . Financial resource strain: None  . Food insecurity - worry: None  . Food insecurity - inability: None  . Transportation needs - medical: None  . Transportation needs - non-medical: None  Occupational History  . Occupation: IT  . Occupation: Guitar player  Tobacco Use  . Smoking status: Never Smoker  . Smokeless tobacco: Never Used  Substance and Sexual Activity  . Alcohol use: Yes    Comment: 2-3 glasses of wine / month  . Drug use: No  . Sexual activity: Yes  Other Topics Concern  . None  Social History Narrative  . None   Family History  Problem Relation Age of Onset  .  Coronary artery disease Mother   . Skin cancer Maternal Uncle   . Diabetes Unknown        MGGM       Review of Systems  Constitutional: Negative for chills, fever, malaise/fatigue and weight loss.  HENT: Negative for congestion and sore throat.   Eyes:       Negative for visual changes  Respiratory: Negative for cough and shortness of breath.   Cardiovascular: Negative for chest pain, palpitations and leg swelling.  Gastrointestinal: Negative for blood in stool, constipation, diarrhea, heartburn, nausea and vomiting.  Genitourinary: Positive for frequency, hesitancy and urgency. Negative for dysuria, flank pain and hematuria.  Musculoskeletal: Negative for falls, joint pain and myalgias.  Skin: Negative for rash.  Neurological: Negative for dizziness, sensory change and headaches.  Endo/Heme/Allergies: Does not bruise/bleed easily.  Psychiatric/Behavioral: Negative for depression, substance abuse and suicidal ideas. The patient is not nervous/anxious.    Procedure Note :     Procedure :  Ear  irrigation (left)   Indication:  Cerumen impaction (left)   Risks, including pain, dizziness, eardrum perforation, bleeding, infection and others as well as benefits were explained to the patient in detail. Verbal consent was obtained and the patient agreed to proceed.    We used "The Elephant Ear Irrigation Device" filled with lukewarm water for irrigation. A large amount wax was recovered. Procedure has also required manual wax removal with an ear loop.   Tolerated well. Complications: None.   Postprocedure instructions :  Call if problems.  Objective:   Vitals:   01/08/18 0809  BP: 116/72  Pulse: 61  Temp: 97.7 F (36.5 C)  SpO2: 95%    Body mass index is 31.95 kg/m.   Physical Examination:  Physical Exam  Constitutional: He is oriented to person, place, and time and well-developed, well-nourished, and in no distress. No distress.  HENT:  Right Ear: External ear normal.  Left Ear: External ear normal.  Nose: Nose normal.  Mouth/Throat: Oropharynx is clear and moist. No oropharyngeal exudate.  Eyes: Conjunctivae and EOM are normal. Pupils are equal, round, and reactive to light. No scleral icterus.  Neck: Normal range of motion. Neck supple. No thyromegaly present.  Cardiovascular: Normal rate, regular rhythm, normal heart sounds and intact distal pulses.  Pulmonary/Chest: Effort normal and breath sounds normal.  Abdominal: Bowel sounds are normal. He exhibits no distension. There is no tenderness.  Genitourinary: Testes/scrotum normal and penis normal. Rectal exam shows no external hemorrhoid, no fissure, no tenderness and guaiac negative stool. Prostate is enlarged. Prostate is not tender.  Musculoskeletal: Normal range of motion. He exhibits no edema or tenderness.  Lymphadenopathy:    He has no cervical adenopathy.  Neurological: He is alert and oriented to person, place, and time. He has normal reflexes. No cranial nerve deficit. Gait normal.  Skin: Skin is warm  and dry.  Psychiatric: Affect and judgment normal.  Vitals reviewed.   ASSESSMENT and PLAN:  Zachary Stone was seen today for annual exam.  Diagnoses and all orders for this visit:  Encounter for preventative adult health care exam with abnormal findings -     CBC -     Hepatic function panel -     IFOBT POC (occult bld, rslt in office); Future  Benign prostatic hyperplasia with urinary frequency -     PSA -     Urinalysis w microscopic + reflex cultur -     tamsulosin (FLOMAX) 0.4 MG CAPS capsule; Take 1 capsule (0.4 mg  total) by mouth daily after supper. -     PSA; Future  Increased urinary frequency  Colon cancer screening -     IFOBT POC (occult bld, rslt in office); Future  Mixed hyperlipidemia -     Lipid panel  Class 1 obesity without serious comorbidity with body mass index (BMI) of 32.0 to 32.9 in adult, unspecified obesity type -     TSH  Impacted cerumen of left ear  Elevated PSA, less than 10 ng/ml -     PSA; Future   BPH (benign prostatic hyperplasia) PSA of 7.43 Pending urinalysis. Start flomax 0.4mg  Repeat PSA in 65month    Recent Results (from the past 2160 hour(s))  CBC     Status: Abnormal   Collection Time: 01/08/18  9:12 AM  Result Value Ref Range   WBC 7.3 4.0 - 10.5 K/uL   RBC 5.28 4.22 - 5.81 Mil/uL   Platelets 206.0 150.0 - 400.0 K/uL   Hemoglobin 17.2 (H) 13.0 - 17.0 g/dL   HCT 48.5 39.0 - 52.0 %   MCV 91.9 78.0 - 100.0 fl   MCHC 35.4 30.0 - 36.0 g/dL   RDW 13.1 11.5 - 15.5 %  Hepatic function panel     Status: None   Collection Time: 01/08/18  9:12 AM  Result Value Ref Range   Total Bilirubin 1.0 0.2 - 1.2 mg/dL   Bilirubin, Direct 0.2 0.0 - 0.3 mg/dL   Alkaline Phosphatase 83 39 - 117 U/L   AST 19 0 - 37 U/L   ALT 25 0 - 53 U/L   Total Protein 6.5 6.0 - 8.3 g/dL   Albumin 3.9 3.5 - 5.2 g/dL  Lipid panel     Status: None   Collection Time: 01/08/18  9:12 AM  Result Value Ref Range   Cholesterol 124 0 - 200 mg/dL    Comment: ATP  III Classification       Desirable:  < 200 mg/dL               Borderline High:  200 - 239 mg/dL          High:  > = 240 mg/dL   Triglycerides 87.0 0.0 - 149.0 mg/dL    Comment: Normal:  <150 mg/dLBorderline High:  150 - 199 mg/dL   HDL 43.00 >39.00 mg/dL   VLDL 17.4 0.0 - 40.0 mg/dL   LDL Cholesterol 63 0 - 99 mg/dL   Total CHOL/HDL Ratio 3     Comment:                Men          Women1/2 Average Risk     3.4          3.3Average Risk          5.0          4.42X Average Risk          9.6          7.13X Average Risk          15.0          11.0                       NonHDL 80.76     Comment: NOTE:  Non-HDL goal should be 30 mg/dL higher than patient's LDL goal (i.e. LDL goal of < 70 mg/dL, would have non-HDL goal of < 100 mg/dL)  TSH  Status: None   Collection Time: 01/08/18  9:12 AM  Result Value Ref Range   TSH 0.81 0.35 - 4.50 uIU/mL  PSA     Status: Abnormal   Collection Time: 01/08/18  9:12 AM  Result Value Ref Range   PSA 7.43 (H) 0.10 - 4.00 ng/mL    Comment: Test performed using Access Hybritech PSA Assay, a parmagnetic partical, chemiluminecent immunoassay.   Follow up: Return if symptoms worsen or fail to improve.  Wilfred Lacy, NP

## 2018-01-08 NOTE — Assessment & Plan Note (Addendum)
PSA of 7.43 Pending urinalysis. Start flomax 0.4mg  Repeat PSA in 54month

## 2018-01-08 NOTE — Patient Instructions (Addendum)
Labs are stable, except very elevated PSA. Pending urinalysis. Return to lab in 46month for repeat psa.  Let me know if you need further flomax refills through mychart.   Health Maintenance, Male A healthy lifestyle and preventive care is important for your health and wellness. Ask your health care provider about what schedule of regular examinations is right for you. What should I know about weight and diet? Eat a Healthy Diet  Eat plenty of vegetables, fruits, whole grains, low-fat dairy products, and lean protein.  Do not eat a lot of foods high in solid fats, added sugars, or salt.  Maintain a Healthy Weight Regular exercise can help you achieve or maintain a healthy weight. You should:  Do at least 150 minutes of exercise each week. The exercise should increase your heart rate and make you sweat (moderate-intensity exercise).  Do strength-training exercises at least twice a week.  Watch Your Levels of Cholesterol and Blood Lipids  Have your blood tested for lipids and cholesterol every 5 years starting at 64 years of age. If you are at high risk for heart disease, you should start having your blood tested when you are 64 years old. You may need to have your cholesterol levels checked more often if: ? Your lipid or cholesterol levels are high. ? You are older than 64 years of age. ? You are at high risk for heart disease.  What should I know about cancer screening? Many types of cancers can be detected early and may often be prevented. Lung Cancer  You should be screened every year for lung cancer if: ? You are a current smoker who has smoked for at least 30 years. ? You are a former smoker who has quit within the past 15 years.  Talk to your health care provider about your screening options, when you should start screening, and how often you should be screened.  Colorectal Cancer  Routine colorectal cancer screening usually begins at 64 years of age and should be  repeated every 5-10 years until you are 64 years old. You may need to be screened more often if early forms of precancerous polyps or small growths are found. Your health care provider may recommend screening at an earlier age if you have risk factors for colon cancer.  Your health care provider may recommend using home test kits to check for hidden blood in the stool.  A small camera at the end of a tube can be used to examine your colon (sigmoidoscopy or colonoscopy). This checks for the earliest forms of colorectal cancer.  Prostate and Testicular Cancer  Depending on your age and overall health, your health care provider may do certain tests to screen for prostate and testicular cancer.  Talk to your health care provider about any symptoms or concerns you have about testicular or prostate cancer.  Skin Cancer  Check your skin from head to toe regularly.  Tell your health care provider about any new moles or changes in moles, especially if: ? There is a change in a mole's size, shape, or color. ? You have a mole that is larger than a pencil eraser.  Always use sunscreen. Apply sunscreen liberally and repeat throughout the day.  Protect yourself by wearing long sleeves, pants, a wide-brimmed hat, and sunglasses when outside.  What should I know about heart disease, diabetes, and high blood pressure?  If you are 72-55 years of age, have your blood pressure checked every 3-5 years. If you are 40  40 years of age or older, have your blood pressure checked every year. You should have your blood pressure measured twice-once when you are at a hospital or clinic, and once when you are not at a hospital or clinic. Record the average of the two measurements. To check your blood pressure when you are not at a hospital or clinic, you can use: ? An automated blood pressure machine at a pharmacy. ? A home blood pressure monitor.  Talk to your health care provider about your target blood  pressure.  If you are between 45-79 years old, ask your health care provider if you should take aspirin to prevent heart disease.  Have regular diabetes screenings by checking your fasting blood sugar level. ? If you are at a normal weight and have a low risk for diabetes, have this test once every three years after the age of 45. ? If you are overweight and have a high risk for diabetes, consider being tested at a younger age or more often.  A one-time screening for abdominal aortic aneurysm (AAA) by ultrasound is recommended for men aged 65-75 years who are current or former smokers. What should I know about preventing infection? Hepatitis B If you have a higher risk for hepatitis B, you should be screened for this virus. Talk with your health care provider to find out if you are at risk for hepatitis B infection. Hepatitis C Blood testing is recommended for:  Everyone born from 1945 through 1965.  Anyone with known risk factors for hepatitis C.  Sexually Transmitted Diseases (STDs)  You should be screened each year for STDs including gonorrhea and chlamydia if: ? You are sexually active and are younger than 64 years of age. ? You are older than 64 years of age and your health care provider tells you that you are at risk for this type of infection. ? Your sexual activity has changed since you were last screened and you are at an increased risk for chlamydia or gonorrhea. Ask your health care provider if you are at risk.  Talk with your health care provider about whether you are at high risk of being infected with HIV. Your health care provider may recommend a prescription medicine to help prevent HIV infection.  What else can I do?  Schedule regular health, dental, and eye exams.  Stay current with your vaccines (immunizations).  Do not use any tobacco products, such as cigarettes, chewing tobacco, and e-cigarettes. If you need help quitting, ask your health care  provider.  Limit alcohol intake to no more than 2 drinks per day. One drink equals 12 ounces of beer, 5 ounces of wine, or 1 ounces of hard liquor.  Do not use street drugs.  Do not share needles.  Ask your health care provider for help if you need support or information about quitting drugs.  Tell your health care provider if you often feel depressed.  Tell your health care provider if you have ever been abused or do not feel safe at home. This information is not intended to replace advice given to you by your health care provider. Make sure you discuss any questions you have with your health care provider. Document Released: 05/02/2008 Document Revised: 07/03/2016 Document Reviewed: 08/08/2015 Elsevier Interactive Patient Education  2018 [employer]  

## 2018-01-08 NOTE — Progress Notes (Signed)
Cardiology Office Note Date:  01/08/2018   ID:  Zachary Stone, DOB 1956/02/06, MRN [id number]  PCP:  Flossie Buffy, NP  Cardiologist:  Sherren Mocha, MD    Chief Complaint  Patient presents with  . Follow-up    CAD     History of Present Illness: Zachary Stone is a 62 y.o. male who presents for follow-up of CAD. The patient has undergone multiple PCI procedures. He initially presented in 2003 with an anterior wall infarction. He underwent bare-metal stenting of the LAD. He has also undergone PCI of the RCA and a repeat PCI of the LAD. Because of chest pain and a decrease in his LV function, he underwent cardiac catheterization in 2014 and this demonstrated continued patency of his RCA stent, moderate mid LAD stenosis with no change from his previous catheterizations, and a widely patent left circumflex. He was also noted to have moderate segmental LV systolic dysfunction. At that time the patient was started on mexiletine because of a high PVC burden. A Holter monitor after initiation of mexiletine demonstrated marked reduction in PVCs.  The patient is doing well.  He is here alone today.  He has some problems with shortness of breath and chest discomfort last fall when he was first starting his walking program, but he has persisted with this and his symptoms have resolved.  He does not feel limited at this point.  He shows me on an app that he has been walking on a regular basis maintaining a heart rate less than 130 bpm recently.  He denies orthopnea, PND, or leg swelling.  He has had no heart palpitations.  Past Medical History:  Diagnosis Date  . Bradycardia    a. h/o significant bradycardia on beta blocker thus not on one.  . Chronic systolic CHF (congestive heart failure) ([place])   . Coronary artery disease    a. Ant MI 2003 tx with PCI to LAD. b. s/p PCI to RCA as well the LAD in January 2006. c. Atypical CP (anginal equivalent) 10/2016 s/p PTCA of LAD.  Marland Kitchen Diverticulitis    on  CT 2012  . Diverticulosis    on CT scan 2009  . 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  Marland Kitchen Hx of cardiovascular stress test    ETT-Myoview (9/14):  Low risk, EF 44%, dAnt and apical scar, no ischemia  . Hyperlipidemia   . Hypertension   . Hypokalemia - borderline 11/13/2016  . NSVT (nonsustained ventricular tachycardia) ([place])    a. brief run 10/2016 (7bt) also on prob list from prior.    Past Surgical History:  Procedure Laterality Date  . CARDIAC CATHETERIZATION N/A 11/12/2016   Procedure: Left Heart Cath and Coronary Angiography;  Surgeon: Leonie Man, MD;  Location: [facility];  Service: Cardiovascular;  Laterality: N/A;  . CARDIAC CATHETERIZATION N/A 11/12/2016   Procedure: Intravascular Pressure Wire/FFR Study;  Surgeon: Leonie Man, MD;  Location: [facility];  Service: Cardiovascular;  Laterality: N/A;  . CARDIAC CATHETERIZATION N/A 11/12/2016   Procedure: Coronary Balloon Angioplasty;  Surgeon: Leonie Man, MD;  Location: [facility];  Service: Cardiovascular;  Laterality: N/A;  . CHOLECYSTECTOMY    . COLONOSCOPY  2007   Negative recheck 2017  . CORONARY ANGIOPLASTY WITH STENT PLACEMENT     Dr.Stuckey  . LEFT HEART CATHETERIZATION WITH CORONARY ANGIOGRAM N/A 09/09/2013   Procedure: LEFT HEART CATHETERIZATION WITH CORONARY ANGIOGRAM;  Surgeon: Blane Ohara, MD;  Location:  [facility];  Service: Cardiovascular;  Laterality: N/A;  . TONSILLECTOMY    . VASECTOMY      Current Outpatient Medications  Medication Sig Dispense Refill  . aspirin 81 MG tablet Take 81 mg by mouth daily.      . Melatonin 1 MG TABS Take 0.5 mg by mouth at bedtime as needed (sleep).    . mexiletine (MEXITIL) 150 MG capsule TAKE ONE CAPSULE BY MOUTH TWICE A DAY 60 capsule 5  . Multiple Vitamin (MULTI-VITAMINS) TABS Take 1 tablet by mouth daily.    . potassium chloride SA (K-DUR,KLOR-CON) 20 MEQ tablet TAKE 1 TABLET (20 MEQ TOTAL) BY MOUTH EVERY EVENING) 30 tablet 9  .  tamsulosin (FLOMAX) 0.4 MG CAPS capsule Take 1 capsule (0.4 mg total) by mouth daily after supper. 30 capsule 0  . atorvastatin (LIPITOR) 80 MG tablet Take 1 tablet (80 mg total) by mouth daily. Please make yearly appt with Dr. Burt Knack for April before anymore refills. 1st attempt 90 tablet 0  . clopidogrel (PLAVIX) 75 MG tablet TAKE ONE TABLET BY MOUTH DAILY 30 tablet 10  . losartan (COZAAR) 50 MG tablet Take 1 tablet (50 mg total) by mouth daily. Please make yearly appt with Dr. Burt Knack for April before anymore refills. 1st attempt 90 tablet 0  . nitroGLYCERIN (NITROSTAT) 0.4 MG SL tablet Place 1 tablet (0.4 mg total) under the tongue every 5 (five) minutes as needed for chest pain (up to 3 doses). 25 tablet 3   No current facility-administered medications for this visit.     Allergies:   Clams [shellfish allergy]; Morphine; and Iodine   Social History:  The patient  reports that  has never smoked. he has never used smokeless tobacco. He reports that he drinks alcohol. He reports that he does not use drugs.   Family History:  The patient's family history includes Coronary artery disease in his mother; Diabetes in his unknown relative; Skin cancer in his maternal uncle.    ROS:  Please see the history of present illness.   All other systems are reviewed and negative.    PHYSICAL EXAM: VS:  BP 116/80   Pulse 69   Ht 5\' 4"  (1.626 m)   Wt 197 lb 12.8 oz (89.7 kg)   BMI 33.95 kg/m  , BMI Body mass index is 33.95 kg/m. GEN: Well nourished, well developed, in no acute distress  HEENT: normal  Neck: no JVD, no masses. No carotid bruits Cardiac: RRR without murmur or gallop                Respiratory:  clear to auscultation bilaterally, normal work of breathing GI: soft, nontender, nondistended, + BS MS: no deformity or atrophy  Ext: no pretibial edema, pedal pulses 2+= bilaterally Skin: warm and dry, no rash Neuro:  Strength and sensation are intact Psych: euthymic mood, full  affect  EKG:  EKG is ordered today. The ekg ordered today shows normal sinus rhythm 69 bpm, occasional PVC, age-indeterminate inferior infarct, age-indeterminate anteroseptal infarct.  Recent Labs: No results found for requested labs within last 8760 hours.   Lipid Panel     Component Value Date/Time   CHOL 95 06/03/2014 0739   TRIG 38.0 06/03/2014 0739   HDL 38.30 (L) 06/03/2014 0739   CHOLHDL 2 06/03/2014 0739   VLDL 7.6 06/03/2014 0739   LDLCALC 49 06/03/2014 0739      Wt Readings from Last 3 Encounters:  01/08/18 197 lb 12.8 oz (89.7 kg)  01/08/18 192 lb (87.1 kg)  10/07/17 193 lb (87.5 kg)     Cardiac Studies Reviewed: Cardiac MRI 2015: FINDINGS: 1. Left ventricle has normal size and thickness with moderately reduced left ventricular function (LVEF 41%). There is akinesis of the apical anterior and inferior walls, mid antero and inferoseptal walls, dyskinesis of the apical septal wall and true apex and hypokinesis of the mid anterior wall. No obvious thrombus but flow stasis was seen in the LV apex.  The measurements are as follows:  LVEDV:  188 ml  LVESV:  111 ml  SV 77 ml  CO:  5.5 ml/min  Myo mass:  146 g  2. There is late gadolinium enhancement in the following territories:  Apical anterior wall: 50-75% (poor prognosis after revascularization)  Apical septal wall:  75-100 % (poor prognosis)  Apical inferior wall:  50-75% (poor prognosis)  True apex: 75-100 % (poor prognosis)  Mid anterior:  0-25% (good prognosis)  Mid anteroseptal wall:  75-100% (poor prognosis)  Mid inferoseptal:  50-75% (fair prognosis)  3. Right ventricle has normal size, thickness and systolic function (RVEF 34%) with no regional wall motion abnormalities.  The measurements are as follows:  RVEDV:  158 ml  RVESV:  80 ml  SV 78 ml  RVCO:  5.6 ml/min  4.  Trace mitral regurgitation, mild tricuspid regurgitation.  5. Mildly dilated left  atrium. PA = 29 mm, Aortic root 40 mm, STJ 27 mm, ascending aorta 30 mm  IMPRESSION: 1. There is moderate LV systolic dysfunction (LVEF 41%) with evidence of prior infarct in the mid LAD and distal PDA territory with poor prognosis for recovery.  Regional wall motion abnormalities as described above with a flow stasis in the apical portion of the ventricle. NO obvious thrombus.  2.  Normal RV size and function, no evidence of ARVC.  ASSESSMENT AND PLAN: 1.  Coronary artery disease, native vessel, with angina: The patient is treated with aspirin and clopidogrel.  He is not able to tolerate beta-blockers.  He is not currently having anginal chest pain and we will continue with observation.  I will see him back in 6 months.  I have reviewed his most recent cardiac catheterization study.  He should be maintained on long-term DA PT with extensive CAD and multiple PCI procedures.  2.  Chronic systolic heart failure, New York Heart Association functional class II symptoms: He continues on losartan.  Again he is unable to tolerate beta-blockers.  He appears euvolemic on exam.  I reviewed his old echo and cardiac MRI studies and it is been over 4 years since his last imaging study.  Will update an echocardiogram.  3.  Hyperlipidemia: The patient is treated with a high intensity statin drug.  He just had labs drawn today and his LDL cholesterol is at goal.  His LFTs are within normal limits.  4.  Symptomatic PVCs: Much improved on mexiletine.  Followed by Dr. Caryl Comes.  Current medicines are reviewed with the patient today.  The patient does not have concerns regarding medicines.  Labs/ tests ordered today include:  No orders of the defined types were placed in this encounter.   Disposition:   FU 6 months  Signed, Sherren Mocha, MD  01/08/2018 11:59 AM    [facility] [address], [place], [place]  [address] Phone: [phone]; Fax: [phone]

## 2018-01-11 LAB — URINALYSIS W MICROSCOPIC + REFLEX CULTURE
Bilirubin Urine: NEGATIVE
GLUCOSE, UA: NEGATIVE
HGB URINE DIPSTICK: NEGATIVE
HYALINE CAST: NONE SEEN /LPF
Nitrites, Initial: NEGATIVE
PROTEIN: NEGATIVE
RBC / HPF: NONE SEEN /HPF (ref 0–2)
SQUAMOUS EPITHELIAL / LPF: NONE SEEN /HPF (ref <=5)
Specific Gravity, Urine: 1.023 (ref 1.001–1.03)
pH: 5.5 (ref 5.0–8.0)

## 2018-01-11 LAB — URINE CULTURE
MICRO NUMBER:: 90235881
SPECIMEN QUALITY:: ADEQUATE

## 2018-01-11 LAB — CULTURE INDICATED

## 2018-01-12 ENCOUNTER — Other Ambulatory Visit: Payer: Self-pay | Admitting: Nurse Practitioner

## 2018-01-12 ENCOUNTER — Encounter: Payer: Self-pay | Admitting: *Deleted

## 2018-01-12 DIAGNOSIS — N3000 Acute cystitis without hematuria: Secondary | ICD-10-CM

## 2018-01-12 MED ORDER — NITROFURANTOIN MONOHYD MACRO 100 MG PO CAPS: 100.0000 mg | ORAL_CAPSULE | Freq: Two times a day (BID) | ORAL | 0 refills | Status: AC

## 2018-01-13 ENCOUNTER — Other Ambulatory Visit: Payer: Self-pay

## 2018-01-13 ENCOUNTER — Ambulatory Visit (HOSPITAL_COMMUNITY): Payer: BLUE CROSS/BLUE SHIELD | Attending: Cardiovascular Disease

## 2018-01-13 DIAGNOSIS — I313 Pericardial effusion (noninflammatory): Secondary | ICD-10-CM | POA: Insufficient documentation

## 2018-01-13 DIAGNOSIS — I509 Heart failure, unspecified: Secondary | ICD-10-CM | POA: Insufficient documentation

## 2018-01-13 DIAGNOSIS — I255 Ischemic cardiomyopathy: Secondary | ICD-10-CM | POA: Diagnosis present

## 2018-01-13 DIAGNOSIS — I110 Hypertensive heart disease with heart failure: Secondary | ICD-10-CM | POA: Insufficient documentation

## 2018-01-13 DIAGNOSIS — I2510 Atherosclerotic heart disease of native coronary artery without angina pectoris: Secondary | ICD-10-CM | POA: Diagnosis not present

## 2018-01-13 LAB — ECHOCARDIOGRAM COMPLETE
AVLVOTPG: 6 mmHg
CHL CUP MV DEC (S): 268
CHL CUP REG VEL DIAS: 82.8 cm/s
CHL CUP TV REG PEAK VELOCITY: 263 cm/s
E/e' ratio: 6.74
EWDT: 268 ms
FS: 36 % (ref 28–44)
IVS/LV PW RATIO, ED: 1.05
LA diam end sys: 36 mm
LA diam index: 1.85 cm/m2
LA vol A4C: 56.2 ml
LA vol: 70.6 mL
LASIZE: 36 mm
LAVOLIN: 36.2 mL/m2
LDCA: 4.91 cm2
LV E/e' medial: 6.74
LV E/e'average: 6.74
LV PW d: 9.23 mm — AB (ref 0.6–1.1)
LV TDI E'LATERAL: 9.79
LV TDI E'MEDIAL: 7.29
LV e' LATERAL: 9.79 cm/s
LVOT SV: 108 mL
LVOT VTI: 22 cm
LVOT diameter: 25 mm
LVOTPV: 119 cm/s
Lateral S' vel: 12.3 cm/s
MV pk A vel: 64 m/s
MV pk E vel: 66 m/s
TAPSE: 19.8 mm
TR max vel: 263 cm/s

## 2018-02-05 ENCOUNTER — Other Ambulatory Visit: Payer: BLUE CROSS/BLUE SHIELD

## 2018-02-06 ENCOUNTER — Other Ambulatory Visit: Payer: Self-pay | Admitting: Nurse Practitioner

## 2018-02-06 DIAGNOSIS — N401 Enlarged prostate with lower urinary tract symptoms: Secondary | ICD-10-CM

## 2018-02-06 DIAGNOSIS — R350 Frequency of micturition: Principal | ICD-10-CM

## 2018-02-06 NOTE — Telephone Encounter (Signed)
15 tablet sent into pharmacy. Left vm remind the pt to come back to lab for PSA.

## 2018-02-10 ENCOUNTER — Other Ambulatory Visit (INDEPENDENT_AMBULATORY_CARE_PROVIDER_SITE_OTHER): Payer: BLUE CROSS/BLUE SHIELD

## 2018-02-10 DIAGNOSIS — R350 Frequency of micturition: Secondary | ICD-10-CM

## 2018-02-10 DIAGNOSIS — N401 Enlarged prostate with lower urinary tract symptoms: Secondary | ICD-10-CM

## 2018-02-10 DIAGNOSIS — R972 Elevated prostate specific antigen [PSA]: Secondary | ICD-10-CM

## 2018-02-10 LAB — PSA: PSA: 3.5 ng/mL (ref 0.10–4.00)

## 2018-02-25 ENCOUNTER — Telehealth: Payer: Self-pay | Admitting: Nurse Practitioner

## 2018-02-25 ENCOUNTER — Other Ambulatory Visit: Payer: Self-pay

## 2018-02-25 DIAGNOSIS — N401 Enlarged prostate with lower urinary tract symptoms: Secondary | ICD-10-CM

## 2018-02-25 DIAGNOSIS — R350 Frequency of micturition: Principal | ICD-10-CM

## 2018-02-25 MED ORDER — TAMSULOSIN HCL 0.4 MG PO CAPS: 0.4000 mg | ORAL_CAPSULE | Freq: Every day | ORAL | 1 refills | Status: DC

## 2018-02-25 NOTE — Telephone Encounter (Signed)
Zachary Stone at PCP office and spoke with Kenney Houseman. Kenney Houseman stated they will refill it on their end.

## 2018-02-25 NOTE — Telephone Encounter (Signed)
Medication refill for tamsulosin has been sent to [facility]. 4.10.19 TLG

## 2018-02-25 NOTE — Telephone Encounter (Signed)
Copied from [place] [id number]. Topic: Quick Communication - Rx Refill/Question >> Feb 25, 2018 10:44 AM Scherrie Gerlach wrote: Medication: tamsulosin (FLOMAX) 0.4 MG CAPS capsule Has the patient contacted their pharmacy? Yes Pt got his labs done and thought Rx was going to be sent in.  Pt has one tab left and needs asap  [place], [address] [phone] (Phone) [phone] (Fax)

## 2018-03-16 ENCOUNTER — Telehealth: Payer: Self-pay | Admitting: Cardiovascular Disease

## 2018-03-16 MED ORDER — POTASSIUM CHLORIDE CRYS ER 20 MEQ PO TBCR: 20.0000 meq | EXTENDED_RELEASE_TABLET | Freq: Every day | ORAL | 11 refills | Status: AC

## 2018-03-16 NOTE — Telephone Encounter (Signed)
New Message       Pt is calling to see why his refill was not filled when pharmacy called for the potassium chloride SA (K-DUR,KLOR-CON) 20 MEQ tablet. Pt states he is going out of town tomorrow and needs this medication.

## 2018-03-16 NOTE — Telephone Encounter (Signed)
New message:       *STAT* If patient is at the pharmacy, call can be transferred to refill team.   1. Which medications need to be refilled? (please list name of each medication and dose if known) potassium chloride SA (K-DUR,KLOR-CON) 20 MEQ tablet  2. Which pharmacy/location (including street and city if local pharmacy) is medication to be sent to?[place], [place] [employer] [address]  3. Do they need a 30 day or 90 day supply? 30      Pt states he is currently out of this medication and he is getting ready to leave to go out of town on tomorrow.

## 2018-03-16 NOTE — Telephone Encounter (Signed)
Rx filled and patient notified. 

## 2018-04-02 ENCOUNTER — Other Ambulatory Visit: Payer: Self-pay | Admitting: Nurse Practitioner

## 2018-04-02 ENCOUNTER — Other Ambulatory Visit: Payer: Self-pay | Admitting: Cardiovascular Disease

## 2018-04-02 DIAGNOSIS — R350 Frequency of micturition: Principal | ICD-10-CM

## 2018-04-02 DIAGNOSIS — N401 Enlarged prostate with lower urinary tract symptoms: Secondary | ICD-10-CM

## 2018-06-11 ENCOUNTER — Ambulatory Visit (INDEPENDENT_AMBULATORY_CARE_PROVIDER_SITE_OTHER): Payer: BLUE CROSS/BLUE SHIELD

## 2018-06-11 ENCOUNTER — Encounter: Payer: Self-pay | Admitting: Nurse Practitioner

## 2018-06-11 ENCOUNTER — Ambulatory Visit: Payer: BLUE CROSS/BLUE SHIELD | Admitting: Nurse Practitioner

## 2018-06-11 VITALS — BP 122/86 | HR 87 | Temp 98.4°F | Ht 64.0 in | Wt 195.0 lb

## 2018-06-11 DIAGNOSIS — R0602 Shortness of breath: Secondary | ICD-10-CM

## 2018-06-11 DIAGNOSIS — J209 Acute bronchitis, unspecified: Secondary | ICD-10-CM | POA: Diagnosis not present

## 2018-06-11 MED ORDER — ALBUTEROL SULFATE HFA 108 (90 BASE) MCG/ACT IN AERS: 1.0000 | INHALATION_SPRAY | Freq: Four times a day (QID) | RESPIRATORY_TRACT | 0 refills | Status: AC | PRN

## 2018-06-11 MED ORDER — BENZONATATE 100 MG PO CAPS: 100.0000 mg | ORAL_CAPSULE | Freq: Three times a day (TID) | ORAL | 0 refills | Status: DC | PRN

## 2018-06-11 MED ORDER — GUAIFENESIN-DM 100-10 MG/5ML PO SYRP: 5.0000 mL | ORAL_SOLUTION | Freq: Four times a day (QID) | ORAL | 0 refills | Status: DC | PRN

## 2018-06-11 MED ORDER — AZITHROMYCIN 250 MG PO TABS: 250.0000 mg | ORAL_TABLET | Freq: Every day | ORAL | 0 refills | Status: DC

## 2018-06-11 MED ORDER — ALBUTEROL SULFATE (2.5 MG/3ML) 0.083% IN NEBU
2.5000 mg | INHALATION_SOLUTION | Freq: Once | RESPIRATORY_TRACT | Status: AC
  Administered 2018-06-11: 2.5 mg via RESPIRATORY_TRACT

## 2018-06-11 NOTE — Patient Instructions (Addendum)
CXR indicates bronchitis with some atelectasis. This may indicate early pneumonia. Sent oral abx also. Return to office if no improvement in 1week  push oral hydration (water or gartorade)  Let me know if diarrhea does not resolve in 1week.  Viral Illness, Adult Viruses are tiny germs that can get into a person's body and cause illness. There are many different types of viruses, and they cause many types of illness. Viral illnesses can range from mild to severe. They can affect various parts of the body. Common illnesses that are caused by a virus include colds and the flu. Viral illnesses also include serious conditions such as HIV/AIDS (human immunodeficiency virus/acquired immunodeficiency syndrome). A few viruses have been linked to certain cancers. What are the causes? Many types of viruses can cause illness. Viruses invade cells in your body, multiply, and cause the infected cells to malfunction or die. When the cell dies, it releases more of the virus. When this happens, you develop symptoms of the illness, and the virus continues to spread to other cells. If the virus takes over the function of the cell, it can cause the cell to divide and grow out of control, as is the case when a virus causes cancer. Different viruses get into the body in different ways. You can get a virus by:  Swallowing food or water that is contaminated with the virus.  Breathing in droplets that have been coughed or sneezed into the air by an infected person.  Touching a surface that has been contaminated with the virus and then touching your eyes, nose, or mouth.  Being bitten by an insect or animal that carries the virus.  Having sexual contact with a person who is infected with the virus.  Being exposed to blood or fluids that contain the virus, either through an open cut or during a transfusion.  If a virus enters your body, your body's defense system (immune system) will try to fight the virus. You may  be at higher risk for a viral illness if your immune system is weak. What are the signs or symptoms? Symptoms vary depending on the type of virus and the location of the cells that it invades. Common symptoms of the main types of viral illnesses include: Cold and flu viruses  Fever.  Headache.  Sore throat.  Muscle aches.  Nasal congestion.  Cough. Digestive system (gastrointestinal) viruses  Fever.  Abdominal pain.  Nausea.  Diarrhea. Liver viruses (hepatitis)  Loss of appetite.  Tiredness.  Yellowing of the skin (jaundice). Brain and spinal cord viruses  Fever.  Headache.  Stiff neck.  Nausea and vomiting.  Confusion or sleepiness. Skin viruses  Warts.  Itching.  Rash. Sexually transmitted viruses  Discharge.  Swelling.  Redness.  Rash. How is this treated? Viruses can be difficult to treat because they live within cells. Antibiotic medicines do not treat viruses because these drugs do not get inside cells. Treatment for a viral illness may include:  Resting and drinking plenty of fluids.  Medicines to relieve symptoms. These can include over-the-counter medicine for pain and fever, medicines for cough or congestion, and medicines to relieve diarrhea.  Antiviral medicines. These drugs are available only for certain types of viruses. They may help reduce flu symptoms if taken early. There are also many antiviral medicines for hepatitis and HIV/AIDS.  Some viral illnesses can be prevented with vaccinations. A common example is the flu shot. Follow these instructions at home: Medicines   Take over-the-counter and prescription  medicines only as told by your health care provider.  If you were prescribed an antiviral medicine, take it as told by your health care provider. Do not stop taking the medicine even if you start to feel better.  Be aware of when antibiotics are needed and when they are not needed. Antibiotics do not treat viruses. If  your health care provider thinks that you may have a bacterial infection as well as a viral infection, you may get an antibiotic. ? Do not ask for an antibiotic prescription if you have been diagnosed with a viral illness. That will not make your illness go away faster. ? Frequently taking antibiotics when they are not needed can lead to antibiotic resistance. When this develops, the medicine no longer works against the bacteria that it normally fights. General instructions  Drink enough fluids to keep your urine clear or pale yellow.  Rest as much as possible.  Return to your normal activities as told by your health care provider. Ask your health care provider what activities are safe for you.  Keep all follow-up visits as told by your health care provider. This is important. How is this prevented? Take these actions to reduce your risk of viral infection:  Eat a healthy diet and get enough rest.  Wash your hands often with soap and water. This is especially important when you are in public places. If soap and water are not available, use hand sanitizer.  Avoid close contact with friends and family who have a viral illness.  If you travel to areas where viral gastrointestinal infection is common, avoid drinking water or eating raw food.  Keep your immunizations up to date. Get a flu shot every year as told by your health care provider.  Do not share toothbrushes, nail clippers, razors, or needles with other people.  Always practice safe sex.  Contact a health care provider if:  You have symptoms of a viral illness that do not go away.  Your symptoms come back after going away.  Your symptoms get worse. Get help right away if:  You have trouble breathing.  You have a severe headache or a stiff neck.  You have severe vomiting or abdominal pain. This information is not intended to replace advice given to you by your health care provider. Make sure you discuss any questions  you have with your health care provider. Document Released: 03/15/2016 Document Revised: 04/17/2016 Document Reviewed: 03/15/2016 Elsevier Interactive Patient Education  [employer].

## 2018-06-11 NOTE — Progress Notes (Signed)
Subjective:  Patient ID: Zachary Stone, male    DOB: 15-Jul-1956  Age: 62 y.o. MRN: [id number]  CC: Cough (pt complaining of dry cough,cant get mucus out,weakness,diarrhea for 5 days. pt stated they just got back from vacation. patient tired dayqual and nyqual. )   Cough  This is a new problem. The current episode started in the past 7 days. The problem has been gradually worsening. The problem occurs constantly. The cough is non-productive. Associated symptoms include chills, myalgias, postnasal drip and shortness of breath. Pertinent negatives include no chest pain, ear congestion, ear pain, fever, headaches, heartburn, hemoptysis, nasal congestion, rhinorrhea, sore throat, sweats, weight loss or wheezing. Associated symptoms comments: fatigue. The symptoms are aggravated by lying down and cold air. Risk factors for lung disease include travel (traveled to [place] on cruise). He has tried OTC cough suppressant for the symptoms. The treatment provided no relief. There is no history of asthma, bronchiectasis, bronchitis, COPD, emphysema, environmental allergies or pneumonia.   Reviewed past Medical, Social and Family history today.  Outpatient Medications Prior to Visit  Medication Sig Dispense Refill  . aspirin 81 MG tablet Take 81 mg by mouth daily.      Marland Kitchen atorvastatin (LIPITOR) 80 MG tablet Take 1 tablet (80 mg total) by mouth daily. 30 tablet 11  . clopidogrel (PLAVIX) 75 MG tablet Take 1 tablet (75 mg total) by mouth daily. 30 tablet 11  . losartan (COZAAR) 50 MG tablet Take 1 tablet (50 mg total) by mouth daily. 30 tablet 11  . Melatonin 1 MG TABS Take 0.5 mg by mouth at bedtime as needed (sleep).    . mexiletine (MEXITIL) 150 MG capsule TAKE ONE CAPSULE BY MOUTH TWICE A DAY 60 capsule 8  . Multiple Vitamin (MULTI-VITAMINS) TABS Take 1 tablet by mouth daily.    . nitroGLYCERIN (NITROSTAT) 0.4 MG SL tablet Place 1 tablet (0.4 mg total) under the tongue every 5 (five) minutes as needed for  chest pain (up to 3 doses). 25 tablet 3  . potassium chloride SA (K-DUR,KLOR-CON) 20 MEQ tablet Take 1 tablet (20 mEq total) by mouth daily. 30 tablet 11  . tamsulosin (FLOMAX) 0.4 MG CAPS capsule Take 1 capsule (0.4 mg total) by mouth daily after supper. 90 capsule 1   No facility-administered medications prior to visit.     ROS See HPI  Objective:  BP 122/86   Pulse 87   Temp 98.4 F (36.9 C) (Oral)   Ht 5\' 4"  (1.626 m)   Wt 195 lb (88.5 kg)   SpO2 97%   BMI 33.47 kg/m   BP Readings from Last 3 Encounters:  06/11/18 122/86  01/08/18 116/80  01/08/18 116/72    Wt Readings from Last 3 Encounters:  06/11/18 195 lb (88.5 kg)  01/08/18 197 lb 12.8 oz (89.7 kg)  01/08/18 192 lb (87.1 kg)    Physical Exam  Constitutional: He is oriented to person, place, and time. No distress.  HENT:  Right Ear: External ear normal.  Left Ear: External ear normal.  Mouth/Throat: No oropharyngeal exudate.  Cardiovascular: Normal rate and regular rhythm.  Pulmonary/Chest: Effort normal and breath sounds normal. No respiratory distress. He has no wheezes. He has no rales.  Abdominal: There is no tenderness.  Lymphadenopathy:    He has no cervical adenopathy.  Neurological: He is alert and oriented to person, place, and time.  Skin: Skin is warm and dry. No rash noted. He is not diaphoretic.  Vitals reviewed.   Lab  Results  Component Value Date   WBC 7.3 01/08/2018   HGB 17.2 (H) 01/08/2018   HCT 48.5 01/08/2018   PLT 206.0 01/08/2018   GLUCOSE 107 (H) 11/13/2016   CHOL 124 01/08/2018   TRIG 87.0 01/08/2018   HDL 43.00 01/08/2018   LDLCALC 63 01/08/2018   ALT 25 01/08/2018   AST 19 01/08/2018   NA 140 11/13/2016   K 3.7 11/13/2016   CL 107 11/13/2016   CREATININE 0.89 11/13/2016   BUN 11 11/13/2016   CO2 28 11/13/2016   TSH 0.81 01/08/2018   PSA 3.50 02/10/2018   INR 0.96 11/12/2016    Dg Chest 2 View  Result Date: 11/11/2016 CLINICAL DATA:  Chest pain mid to LEFT  side, chronic chest pain for years but worse this morning, history hypertension, hyperlipidemia EXAM: CHEST  2 VIEW COMPARISON:  11/20/2010 FINDINGS: Normal heart size, mediastinal contours, and pulmonary vascularity. Coronary arterial stents noted. Chronic peribronchial thickening with minimal subsegmental atelectasis at LEFT costophrenic angle. No acute infiltrate, pleural effusion or pneumothorax. Bones unremarkable. Surgical clips RIGHT upper quadrant question cholecystectomy. IMPRESSION: Prior coronary PTCA. Bronchitic changes with minimal LEFT basilar subsegmental atelectasis. Electronically Signed   By: Lavonia Dana M.D.   On: 11/11/2016 13:44    Assessment & Plan:   Romano was seen today for cough.  Diagnoses and all orders for this visit:  Acute bronchitis, unspecified organism -     albuterol (PROVENTIL) (2.5 MG/3ML) 0.083% nebulizer solution 2.5 mg -     DG Chest 2 View; Future -     benzonatate (TESSALON) 100 MG capsule; Take 1 capsule (100 mg total) by mouth 3 (three) times daily as needed for cough. -     guaiFENesin-dextromethorphan (ROBITUSSIN DM) 100-10 MG/5ML syrup; Take 5 mLs by mouth every 6 (six) hours as needed for cough. -     albuterol (PROVENTIL HFA;VENTOLIN HFA) 108 (90 Base) MCG/ACT inhaler; Inhale 1-2 puffs into the lungs every 6 (six) hours as needed. -     DG Chest 2 View -     azithromycin (ZITHROMAX Z-PAK) 250 MG tablet; Take 1 tablet (250 mg total) by mouth daily. Take 2tabs on first day, then 1tab once a day till complete  Shortness of breath -     albuterol (PROVENTIL) (2.5 MG/3ML) 0.083% nebulizer solution 2.5 mg -     DG Chest 2 View; Future -     benzonatate (TESSALON) 100 MG capsule; Take 1 capsule (100 mg total) by mouth 3 (three) times daily as needed for cough. -     guaiFENesin-dextromethorphan (ROBITUSSIN DM) 100-10 MG/5ML syrup; Take 5 mLs by mouth every 6 (six) hours as needed for cough. -     albuterol (PROVENTIL HFA;VENTOLIN HFA) 108 (90 Base)  MCG/ACT inhaler; Inhale 1-2 puffs into the lungs every 6 (six) hours as needed. -     DG Chest 2 View -     azithromycin (ZITHROMAX Z-PAK) 250 MG tablet; Take 1 tablet (250 mg total) by mouth daily. Take 2tabs on first day, then 1tab once a day till complete   I am having Kaleen Mask start on benzonatate, guaiFENesin-dextromethorphan, albuterol, and azithromycin. I am also having him maintain his aspirin, MULTI-VITAMINS, Melatonin, nitroGLYCERIN, losartan, clopidogrel, atorvastatin, potassium chloride SA, tamsulosin, and mexiletine. We administered albuterol.  Meds ordered this encounter  Medications  . albuterol (PROVENTIL) (2.5 MG/3ML) 0.083% nebulizer solution 2.5 mg  . benzonatate (TESSALON) 100 MG capsule    Sig: Take 1 capsule (100 mg total)  by mouth 3 (three) times daily as needed for cough.    Dispense:  20 capsule    Refill:  0    Order Specific Question:   Supervising Provider    Answer:   Lucille Passy [[id number]]  . guaiFENesin-dextromethorphan (ROBITUSSIN DM) 100-10 MG/5ML syrup    Sig: Take 5 mLs by mouth every 6 (six) hours as needed for cough.    Dispense:  118 mL    Refill:  0    Order Specific Question:   Supervising Provider    Answer:   Lucille Passy [[id number]]  . albuterol (PROVENTIL HFA;VENTOLIN HFA) 108 (90 Base) MCG/ACT inhaler    Sig: Inhale 1-2 puffs into the lungs every 6 (six) hours as needed.    Dispense:  1 Inhaler    Refill:  0    Order Specific Question:   Supervising Provider    Answer:   Lucille Passy [[id number]]  . azithromycin (ZITHROMAX Z-PAK) 250 MG tablet    Sig: Take 1 tablet (250 mg total) by mouth daily. Take 2tabs on first day, then 1tab once a day till complete    Dispense:  6 tablet    Refill:  0    Order Specific Question:   Supervising Provider    Answer:   Lucille Passy [[id number]]    Follow-up: Return if symptoms worsen or fail to improve.  Wilfred Lacy, NP

## 2018-07-09 ENCOUNTER — Encounter: Payer: Self-pay | Admitting: Nurse Practitioner

## 2018-07-09 ENCOUNTER — Ambulatory Visit (INDEPENDENT_AMBULATORY_CARE_PROVIDER_SITE_OTHER): Payer: BLUE CROSS/BLUE SHIELD | Admitting: Nurse Practitioner

## 2018-07-09 VITALS — BP 130/80 | HR 58 | Temp 98.1°F | Ht 64.0 in | Wt 196.0 lb

## 2018-07-09 DIAGNOSIS — M654 Radial styloid tenosynovitis [de Quervain]: Secondary | ICD-10-CM | POA: Diagnosis not present

## 2018-07-09 MED ORDER — DICLOFENAC SODIUM 2 % TD SOLN: 1.0000 [in_us] | Freq: Two times a day (BID) | TRANSDERMAL | 0 refills | Status: AC

## 2018-07-09 MED ORDER — PREDNISONE 5 MG (21) PO TBPK: ORAL_TABLET | ORAL | 0 refills | Status: AC

## 2018-07-09 NOTE — Patient Instructions (Addendum)
Take oral prednisone with food. Declined use of any oral NSAID's due to possible DDI with plavix.  Use Thump spica splint on wrists AM and PM x 1week, then PM only continuously.          If directed, apply ice to the injured area. ? Put ice in a plastic bag. ? Place a towel between your skin and the bag. ? Leave the ice on for 20 minutes, 2-3 times per day.  Call office for referral to sports medicine if no improvement in 2weeks.  De Quervain Tenosynovitis Tendons attach muscles to bones. They also help with joint movements. When tendons become irritated or swollen, it is called tendinitis. The extensor pollicis brevis (EPB) tendon connects the EPB muscle to a bone that is near the base of the thumb. The EPB muscle helps to straighten and extend the thumb. De Quervain tenosynovitis is a condition in which the EPB tendon lining (sheath) becomes irritated, thickened, and swollen. This condition is sometimes called stenosing tenosynovitis. This condition causes pain on the thumb side of the back of the wrist. What are the causes? Causes of this condition include:  Activities that repeatedly cause your thumb and wrist to extend.  A sudden increase in activity or change in activity that affects your wrist.  What increases the risk? This condition is more likely to develop in:  Females.  People who have diabetes.  Women who have recently given birth.  People who are over 61 years of age.  People who do activities that involve repeated hand and wrist motions, such as tennis, racquetball, volleyball, gardening, and taking care of children.  People who do heavy labor.  People who have poor wrist strength and flexibility.  People who do not warm up properly before activities.  What are the signs or symptoms? Symptoms of this condition include:  Pain or tenderness over the thumb side of the back of the wrist when your thumb and wrist are not moving.  Pain that gets worse when you  straighten your thumb or extend your thumb or wrist.  Pain when the injured area is touched.  Locking or catching of the thumb joint while you bend and straighten your thumb.  Decreased thumb motion due to pain.  Swelling over the affected area.  How is this diagnosed? This condition is diagnosed with a medical history and physical exam. Your health care provider will ask for details about your injury and ask about your symptoms. How is this treated? Treatment may include the use of icing and medicines to reduce pain and swelling. You may also be advised to wear a splint or brace to limit your thumb and wrist motion. In less severe cases, treatment may also include working with a physical therapist to strengthen your wrist and calm the irritation around your EPB tendon sheath. In severe cases, surgery may be needed. Follow these instructions at home: If you have a splint or brace:  Wear it as told by your health care provider. Remove it only as told by your health care provider.  Loosen the splint or brace if your fingers become numb and tingle, or if they turn cold and blue.  Keep the splint or brace clean and dry. Managing pain, stiffness, and swelling  If directed, apply ice to the injured area. ? Put ice in a plastic bag. ? Place a towel between your skin and the bag. ? Leave the ice on for 20 minutes, 2-3 times per day.  Move your  fingers often to avoid stiffness and to lessen swelling.  Raise (elevate) the injured area above the level of your heart while you are sitting or lying down. General instructions  Return to your normal activities as told by your health care provider. Ask your health care provider what activities are safe for you.  Take over-the-counter and prescription medicines only as told by your health care provider.  Keep all follow-up visits as told by your health care provider. This is important.  Do not drive or operate heavy machinery while taking  prescription pain medicine. Contact a health care provider if:  Your pain, tenderness, or swelling gets worse, even if you have had treatment.  You have numbness or tingling in your wrist, hand, or fingers on the injured side. This information is not intended to replace advice given to you by your health care provider. Make sure you discuss any questions you have with your health care provider. Document Released: 11/04/2005 Document Revised: 04/11/2016 Document Reviewed: 01/10/2015 Elsevier Interactive Patient Education  [employer].

## 2018-07-09 NOTE — Progress Notes (Signed)
Subjective:  Patient ID: Zachary Stone, male    DOB: Dec 12, 1955  Age: 62 y.o. MRN: [id number]  CC: Wrist Pain (both wrist pain with movments, this has been going on for while and it is getting worse. pt tired using wrist brace.)   Wrist Pain   The pain is present in the left wrist and right wrist. This is a recurrent problem. The current episode started more than 1 month ago. There has been no history of extremity trauma. The problem occurs constantly. The problem has been waxing and waning. The quality of the pain is described as aching and dull. Associated symptoms include a limited range of motion. Pertinent negatives include no fever, inability to bear weight, itching, joint locking or joint swelling. The symptoms are aggravated by activity. He has tried rest for the symptoms. The treatment provided mild relief. Family history does not include gout or rheumatoid arthritis. His past medical history is significant for osteoarthritis. There is no history of diabetes, gout or rheumatoid arthritis.  plays guitar for recreation (daily [employer]) Works of IT trainer daily. Denies any previous injury. No improvement with use of wrist splint.  Reviewed past Medical, Social and Family history today.  Outpatient Medications Prior to Visit  Medication Sig Dispense Refill  . albuterol (PROVENTIL HFA;VENTOLIN HFA) 108 (90 Base) MCG/ACT inhaler Inhale 1-2 puffs into the lungs every 6 (six) hours as needed. 1 Inhaler 0  . aspirin 81 MG tablet Take 81 mg by mouth daily.      Marland Kitchen atorvastatin (LIPITOR) 80 MG tablet Take 1 tablet (80 mg total) by mouth daily. 30 tablet 11  . clopidogrel (PLAVIX) 75 MG tablet Take 1 tablet (75 mg total) by mouth daily. 30 tablet 11  . losartan (COZAAR) 50 MG tablet Take 1 tablet (50 mg total) by mouth daily. 30 tablet 11  . Melatonin 1 MG TABS Take 0.5 mg by mouth at bedtime as needed (sleep).    . mexiletine (MEXITIL) 150 MG capsule TAKE ONE CAPSULE BY MOUTH TWICE A DAY  60 capsule 8  . Multiple Vitamin (MULTI-VITAMINS) TABS Take 1 tablet by mouth daily.    . nitroGLYCERIN (NITROSTAT) 0.4 MG SL tablet Place 1 tablet (0.4 mg total) under the tongue every 5 (five) minutes as needed for chest pain (up to 3 doses). 25 tablet 3  . potassium chloride SA (K-DUR,KLOR-CON) 20 MEQ tablet Take 1 tablet (20 mEq total) by mouth daily. 30 tablet 11  . tamsulosin (FLOMAX) 0.4 MG CAPS capsule Take 1 capsule (0.4 mg total) by mouth daily after supper. 90 capsule 1  . azithromycin (ZITHROMAX Z-PAK) 250 MG tablet Take 1 tablet (250 mg total) by mouth daily. Take 2tabs on first day, then 1tab once a day till complete (Patient not taking: Reported on 07/09/2018) 6 tablet 0  . benzonatate (TESSALON) 100 MG capsule Take 1 capsule (100 mg total) by mouth 3 (three) times daily as needed for cough. (Patient not taking: Reported on 07/09/2018) 20 capsule 0  . guaiFENesin-dextromethorphan (ROBITUSSIN DM) 100-10 MG/5ML syrup Take 5 mLs by mouth every 6 (six) hours as needed for cough. (Patient not taking: Reported on 07/09/2018) 118 mL 0   No facility-administered medications prior to visit.     ROS See HPI  Objective:  BP 130/80   Pulse (!) 58   Temp 98.1 F (36.7 C) (Oral)   Ht 5\' 4"  (1.626 m)   Wt 196 lb (88.9 kg)   SpO2 96%   BMI 33.64 kg/m  BP Readings from Last 3 Encounters:  07/09/18 130/80  06/11/18 122/86  01/08/18 116/80    Wt Readings from Last 3 Encounters:  07/09/18 196 lb (88.9 kg)  06/11/18 195 lb (88.5 kg)  01/08/18 197 lb 12.8 oz (89.7 kg)    Physical Exam  Constitutional: He is oriented to person, place, and time. No distress.  Musculoskeletal: He exhibits tenderness. He exhibits no edema or deformity.       Right wrist: He exhibits tenderness. He exhibits normal range of motion, no bony tenderness, no swelling, no effusion, no crepitus and no deformity.       Left wrist: He exhibits tenderness. He exhibits normal range of motion, no bony tenderness, no  swelling, no effusion, no crepitus and no deformity.       Right hand: Normal. Normal sensation noted. Normal strength noted.       Left hand: Normal. Normal sensation noted. Normal strength noted.  Tenderness with lateral and medial wrist deviation. No muscle atrophy.   Neurological: He is alert and oriented to person, place, and time.  Skin: Skin is warm and dry. No rash noted. No erythema.  Vitals reviewed.   Lab Results  Component Value Date   WBC 7.3 01/08/2018   HGB 17.2 (H) 01/08/2018   HCT 48.5 01/08/2018   PLT 206.0 01/08/2018   GLUCOSE 107 (H) 11/13/2016   CHOL 124 01/08/2018   TRIG 87.0 01/08/2018   HDL 43.00 01/08/2018   LDLCALC 63 01/08/2018   ALT 25 01/08/2018   AST 19 01/08/2018   NA 140 11/13/2016   K 3.7 11/13/2016   CL 107 11/13/2016   CREATININE 0.89 11/13/2016   BUN 11 11/13/2016   CO2 28 11/13/2016   TSH 0.81 01/08/2018   PSA 3.50 02/10/2018   INR 0.96 11/12/2016    Dg Chest 2 View  Result Date: 11/11/2016 CLINICAL DATA:  Chest pain mid to LEFT side, chronic chest pain for years but worse this morning, history hypertension, hyperlipidemia EXAM: CHEST  2 VIEW COMPARISON:  11/20/2010 FINDINGS: Normal heart size, mediastinal contours, and pulmonary vascularity. Coronary arterial stents noted. Chronic peribronchial thickening with minimal subsegmental atelectasis at LEFT costophrenic angle. No acute infiltrate, pleural effusion or pneumothorax. Bones unremarkable. Surgical clips RIGHT upper quadrant question cholecystectomy. IMPRESSION: Prior coronary PTCA. Bronchitic changes with minimal LEFT basilar subsegmental atelectasis. Electronically Signed   By: Lavonia Dana M.D.   On: 11/11/2016 13:44    Assessment & Plan:   Zachary Stone was seen today for wrist pain.  Diagnoses and all orders for this visit:  Tenosynovitis, de Quervain -     Diclofenac Sodium 2 % SOLN; Place 1 inch onto the skin 2 (two) times daily. -     predniSONE (STERAPRED UNI-PAK 21 TAB) 5 MG  (21) TBPK tablet; As directed on package   I have discontinued Zachary Stone's benzonatate, guaiFENesin-dextromethorphan, and azithromycin. I am also having him start on Diclofenac Sodium and predniSONE. Additionally, I am having him maintain his aspirin, MULTI-VITAMINS, Melatonin, nitroGLYCERIN, losartan, clopidogrel, atorvastatin, potassium chloride SA, tamsulosin, mexiletine, and albuterol.  Meds ordered this encounter  Medications  . Diclofenac Sodium 2 % SOLN    Sig: Place 1 inch onto the skin 2 (two) times daily.    Dispense:  2 g    Refill:  0    Order Specific Question:   Supervising Provider    Answer:   Lucille Passy [[id number]]  . predniSONE (STERAPRED UNI-PAK 21 TAB) 5 MG (21) TBPK tablet  Sig: As directed on package    Dispense:  21 tablet    Refill:  0    Order Specific Question:   Supervising Provider    Answer:   Lucille Passy [[id number]]    Follow-up: No follow-ups on file.  Wilfred Lacy, NP

## 2018-07-31 ENCOUNTER — Telehealth: Payer: Self-pay | Admitting: Nurse Practitioner

## 2018-07-31 NOTE — Telephone Encounter (Signed)
Rec'd from [facility] forwarded 3 pages to Dr. Flossie Buffy NP

## 2018-09-18 DEATH — deceased

## 2019-05-31 IMAGING — DX DG CHEST 2V
2 series · 2 of 2 positions shown · non-contrast
Comparison: 11/11/2016

CLINICAL DATA: Cough, fatigue, shortness of breath

EXAM:
CHEST - 2 VIEW

[chest pa]
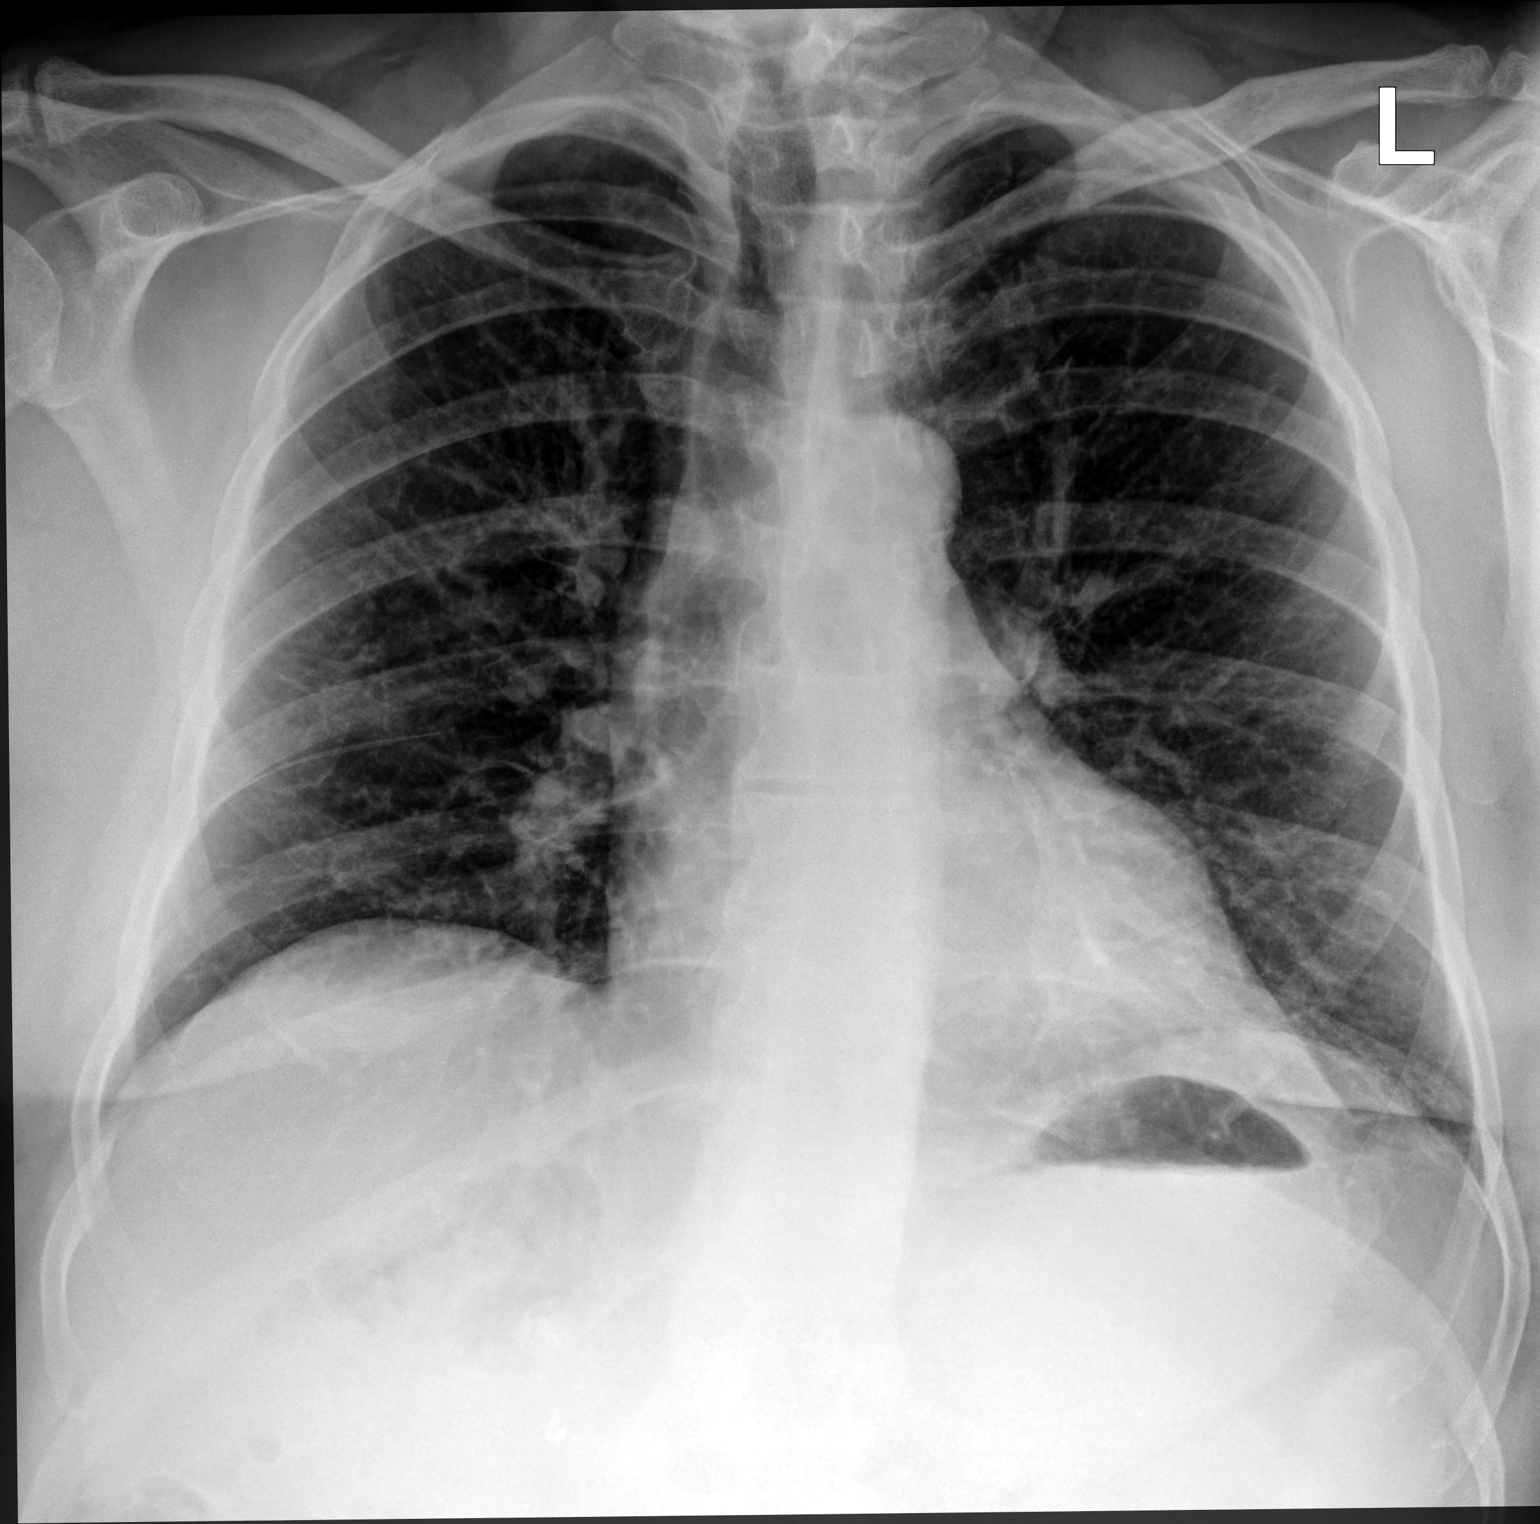

[chest lat]
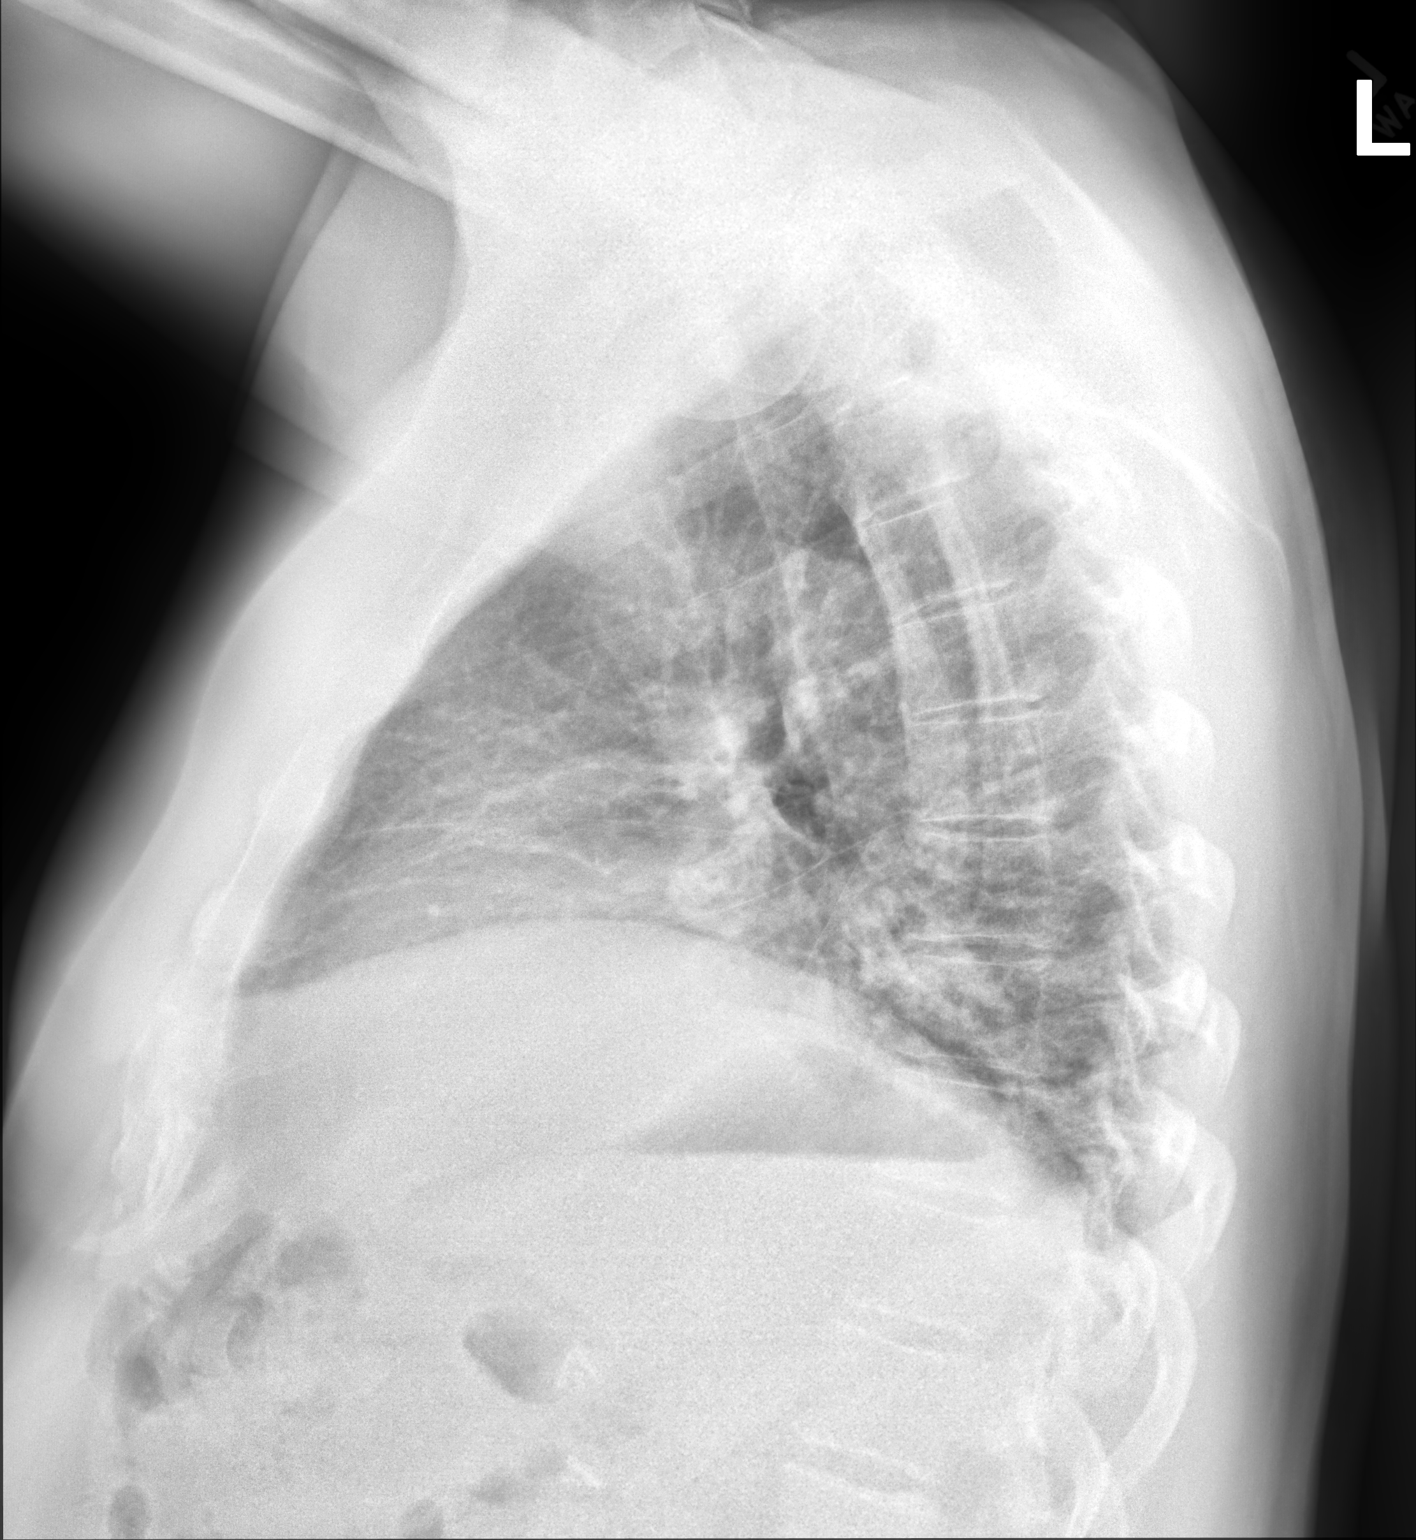

[2 of 2 positions shown; findings below may reference images not displayed]

FINDINGS: Mild peribronchial thickening. Heart is normal size. Bibasilar
atelectasis. No effusions or acute bony abnormality.
IMPRESSION: Bronchitic changes.  Bibasilar atelectasis.

## 2020-02-09 ENCOUNTER — Encounter: Payer: Self-pay | Admitting: General Practice
